# Patient Record
Sex: Female | Born: 1980
Health system: Southern US, Community
[De-identification: ages and names within clinical notes are randomized; demographics above are authoritative.]

## PROBLEM LIST (undated history)

## (undated) ENCOUNTER — Inpatient Hospital Stay (HOSPITAL_COMMUNITY): Payer: Self-pay

## (undated) ENCOUNTER — Ambulatory Visit: Disposition: A | Discharge status: Home / Self Care | Payer: Medicaid Other

## (undated) ENCOUNTER — Emergency Department (HOSPITAL_COMMUNITY): Disposition: A | Discharge status: Home / Self Care | Payer: 59

## (undated) DIAGNOSIS — F909 Attention-deficit hyperactivity disorder, unspecified type: Secondary | ICD-10-CM

## (undated) DIAGNOSIS — R519 Headache, unspecified: Secondary | ICD-10-CM

## (undated) DIAGNOSIS — F99 Mental disorder, not otherwise specified: Secondary | ICD-10-CM

## (undated) DIAGNOSIS — Z309 Encounter for contraceptive management, unspecified: Secondary | ICD-10-CM

## (undated) DIAGNOSIS — R87629 Unspecified abnormal cytological findings in specimens from vagina: Secondary | ICD-10-CM

## (undated) DIAGNOSIS — M549 Dorsalgia, unspecified: Secondary | ICD-10-CM

## (undated) DIAGNOSIS — K831 Obstruction of bile duct: Secondary | ICD-10-CM

## (undated) DIAGNOSIS — N390 Urinary tract infection, site not specified: Secondary | ICD-10-CM

## (undated) DIAGNOSIS — L719 Rosacea, unspecified: Secondary | ICD-10-CM

## (undated) DIAGNOSIS — Z789 Other specified health status: Secondary | ICD-10-CM

## (undated) HISTORY — PX: WISDOM TOOTH EXTRACTION: SHX21

## (undated) HISTORY — DX: Encounter for contraceptive management, unspecified: Z30.9

## (undated) HISTORY — DX: Unspecified abnormal cytological findings in specimens from vagina: R87.629

## (undated) HISTORY — DX: Mental disorder, not otherwise specified: F99

## (undated) HISTORY — DX: Dorsalgia, unspecified: M54.9

## (undated) HISTORY — DX: Rosacea, unspecified: L71.9

## (undated) HISTORY — DX: Urinary tract infection, site not specified: N39.0

---

## 2009-11-30 ENCOUNTER — Ambulatory Visit (HOSPITAL_COMMUNITY): Admission: RE | Admit: 2009-11-30 | Discharge: 2009-11-30 | Payer: Self-pay | Admitting: Family Medicine

## 2009-12-06 ENCOUNTER — Encounter: Admission: RE | Admit: 2009-12-06 | Discharge: 2009-12-06 | Payer: Self-pay | Admitting: Otolaryngology

## 2010-04-10 ENCOUNTER — Other Ambulatory Visit: Admission: RE | Admit: 2010-04-10 | Discharge: 2010-04-10 | Payer: Self-pay | Admitting: Obstetrics & Gynecology

## 2010-12-23 ENCOUNTER — Encounter: Payer: Self-pay | Admitting: Otolaryngology

## 2011-05-08 ENCOUNTER — Other Ambulatory Visit (HOSPITAL_COMMUNITY)
Admission: RE | Admit: 2011-05-08 | Discharge: 2011-05-08 | Disposition: A | Discharge status: Home / Self Care | Payer: Managed Care, Other (non HMO) | Source: Ambulatory Visit | Attending: Obstetrics and Gynecology | Admitting: Obstetrics and Gynecology

## 2011-05-08 DIAGNOSIS — Z113 Encounter for screening for infections with a predominantly sexual mode of transmission: Secondary | ICD-10-CM | POA: Insufficient documentation

## 2011-05-08 DIAGNOSIS — Z01419 Encounter for gynecological examination (general) (routine) without abnormal findings: Secondary | ICD-10-CM | POA: Insufficient documentation

## 2011-09-21 ENCOUNTER — Inpatient Hospital Stay (HOSPITAL_COMMUNITY)
Admission: AD | Admit: 2011-09-21 | Discharge: 2011-09-21 | Disposition: A | Discharge status: Home / Self Care | Payer: Managed Care, Other (non HMO) | Source: Ambulatory Visit | Attending: Obstetrics & Gynecology | Admitting: Obstetrics & Gynecology

## 2011-09-21 ENCOUNTER — Encounter (HOSPITAL_COMMUNITY): Payer: Self-pay

## 2011-09-21 DIAGNOSIS — O99891 Other specified diseases and conditions complicating pregnancy: Secondary | ICD-10-CM | POA: Insufficient documentation

## 2011-09-21 DIAGNOSIS — R42 Dizziness and giddiness: Secondary | ICD-10-CM | POA: Insufficient documentation

## 2011-09-21 HISTORY — DX: Other specified health status: Z78.9

## 2011-09-21 LAB — URINALYSIS, ROUTINE W REFLEX MICROSCOPIC
Bilirubin Urine: NEGATIVE
Ketones, ur: NEGATIVE mg/dL
Leukocytes, UA: NEGATIVE
Nitrite: NEGATIVE
Protein, ur: NEGATIVE mg/dL
Specific Gravity, Urine: <1.005 — ABNORMAL LOW (ref 1.005–1.030)
pH: 6 (ref 5.0–8.0)

## 2011-09-21 NOTE — Progress Notes (Signed)
In childbirth class having headache, some shortness of breath, light headed

## 2011-09-21 NOTE — ED Provider Notes (Signed)
Attestation of Attending Supervision of Advanced Practitioner: Evaluation and management procedures were performed by the PA/NP/CNM/OB Fellow under my supervision/collaboration. Chart reviewed and agree with management and plan.  Yazlyn Wentzel A 09/21/2011 4:55 PM   

## 2011-09-21 NOTE — ED Provider Notes (Signed)
Chief Complaint:  Shortness of Breath and Dizziness   Donna Kim is  30 y.o. G1P0 at [redacted]w[redacted]d presents complaining of Shortness of Breath and Dizziness .  She states none contractions associated with none vaginal bleeding, intact membranes, along with active fetal movement. She was in Childbirth Class here at [facility], doing breathing exercises, and began feeling dizzy and SOB.  Feels better now after resting  Obstetrical/Gynecological History: Menstrual History: OB History    Grav Para Term Preterm Abortions TAB SAB Ect Mult Living   1                No LMP recorded. Patient is pregnant.     Past Medical History: Past Medical History  Diagnosis Date  . No pertinent past medical history     Past Surgical History: Past Surgical History  Procedure Date  . Wisdom tooth extraction     Family History: No family history on file.  Social History: History  Substance Use Topics  . Smoking status: Former Games developer  . Smokeless tobacco: Not on file  . Alcohol Use: No    Allergies: No Known Allergies  Meds:  Prescriptions prior to admission  Medication Sig Dispense Refill  . acetaminophen (TYLENOL) 325 MG tablet Take 650 mg by mouth every 6 (six) hours as needed. Patient took this medication for pain.       . prenatal vitamin w/FE, FA (PRENATAL 1 + 1) 27-1 MG TABS Take 1 tablet by mouth daily.          Review of Systems - Please refer to the aforementioned patients' reports.     Physical Exam  Blood pressure 110/73, pulse 64, temperature 97.7 F (36.5 C), temperature source Oral, resp. rate 16, height 5\' 4"  (1.626 m), weight 80.196 kg (176 lb 12.8 oz), SpO2 98.00%. GENERAL: Well-developed, well-nourished female in no acute distress.  LUNGS: Clear to auscultation bilaterally.  HEART: Regular rate and rhythm. ABDOMEN: Soft, nontender, nondistended, gravid.  EXTREMITIES: Nontender, no edema, 2+ distal pulses. FHT:  Baseline rate 140 bpm   Variability moderate  Accelerations  present   Decelerations none Contractions: Every 0 mins   Labs: Recent Results (from the past 24 hour(s))  URINALYSIS, ROUTINE W REFLEX MICROSCOPIC   Collection Time   09/21/11  2:10 PM      Component Value Range   Color, Urine YELLOW  YELLOW    Appearance CLEAR  CLEAR    Specific Gravity, Urine <1.005 (*) 1.005 - 1.030    pH 6.0  5.0 - 8.0    Glucose, UA NEGATIVE  NEGATIVE (mg/dL)   Hgb urine dipstick NEGATIVE  NEGATIVE    Bilirubin Urine NEGATIVE  NEGATIVE    Ketones, ur NEGATIVE  NEGATIVE (mg/dL)   Protein, ur NEGATIVE  NEGATIVE (mg/dL)   Urobilinogen, UA 0.2  0.0 - 1.0 (mg/dL)   Nitrite NEGATIVE  NEGATIVE    Leukocytes, UA NEGATIVE  NEGATIVE    Imaging Studies:  No results found.  Assessment: Donna Kim is  30 y.o. G1P0 at [redacted]w[redacted]d presents with Dizziness probably asso with hyperventilation.  Plan: D/C home.  Pt plans to go back to class  CRESENZO-DISHMAN,Darroll Bredeson 10/20/20123:28 PM

## 2011-09-21 NOTE — Progress Notes (Signed)
Was in childbirth classes today, was doing breathing exercises, felt SOB.  Earlier, felt "like my heart had moved to left side and palpitations."

## 2011-12-03 NOTE — L&D Delivery Note (Cosign Needed)
Delivery Note At 10:34 AM a viable female was delivered via Vaginal, Spontaneous Delivery (Presentation: ; Occiput Anterior).  APGAR: 9, 9; weight 7 lb 2.6 oz (3249 g).   Placenta status: Intact, Spontaneous.  Cord: 3 vessels with the following complications: None.  Cord pH: not indicated.  Anesthesia: Epidural  Episiotomy: None Lacerations: 2nd degree;Perineal Suture Repair: 2.0 Vicryl Est. Blood Loss (mL): 350 ml   Mom to postpartum.  Baby to nursery-stable. Infant skin-skin with mother, bonding well, family members at bedside.    LEFTWICH-KIRBY, Hatley Henegar 01/02/2012, 11:45 AM

## 2012-01-02 ENCOUNTER — Inpatient Hospital Stay (HOSPITAL_COMMUNITY)
Admission: AD | Admit: 2012-01-02 | Discharge: 2012-01-04 | DRG: 775 | Disposition: A | Discharge status: Home / Self Care | Payer: Medicaid Other | Source: Ambulatory Visit | Attending: Obstetrics and Gynecology | Admitting: Obstetrics and Gynecology

## 2012-01-02 ENCOUNTER — Inpatient Hospital Stay (HOSPITAL_COMMUNITY): Payer: Medicaid Other | Admitting: Anesthesiology

## 2012-01-02 ENCOUNTER — Encounter (HOSPITAL_COMMUNITY): Payer: Self-pay | Admitting: *Deleted

## 2012-01-02 ENCOUNTER — Encounter (HOSPITAL_COMMUNITY): Payer: Self-pay | Admitting: Anesthesiology

## 2012-01-02 DIAGNOSIS — Z22330 Carrier of Group B streptococcus: Secondary | ICD-10-CM

## 2012-01-02 DIAGNOSIS — O9989 Other specified diseases and conditions complicating pregnancy, childbirth and the puerperium: Secondary | ICD-10-CM

## 2012-01-02 DIAGNOSIS — O99892 Other specified diseases and conditions complicating childbirth: Secondary | ICD-10-CM

## 2012-01-02 LAB — CBC
HCT: 36.7 % (ref 36.0–46.0)
Hemoglobin: 12.8 g/dL (ref 12.0–15.0)
WBC: 16.6 10*3/uL — ABNORMAL HIGH (ref 4.0–10.5)

## 2012-01-02 LAB — ABO/RH

## 2012-01-02 LAB — GC/CHLAMYDIA PROBE AMP, GENITAL
Chlamydia: NEGATIVE
Gonorrhea: NEGATIVE

## 2012-01-02 LAB — STREP B DNA PROBE: GBS: POSITIVE

## 2012-01-02 LAB — RPR: RPR Ser Ql: NONREACTIVE

## 2012-01-02 MED ORDER — PENICILLIN G POTASSIUM 5000000 UNITS IJ SOLR
2.5000 10*6.[IU] | INTRAVENOUS | Status: DC
  Administered 2012-01-02: 2.5 10*6.[IU] via INTRAVENOUS
  Filled 2012-01-02 (×6): qty 2.5

## 2012-01-02 MED ORDER — STERILE WATER FOR INJECTION IJ SOLN
2.0000 mL | Freq: Once | INTRAMUSCULAR | Status: AC
  Administered 2012-01-02: 2 mL via INTRAMUSCULAR
  Filled 2012-01-02: qty 5

## 2012-01-02 MED ORDER — DIPHENHYDRAMINE HCL 25 MG PO CAPS: 25.0000 mg | ORAL_CAPSULE | Freq: Four times a day (QID) | ORAL | Status: DC | PRN

## 2012-01-02 MED ORDER — PRENATAL MULTIVITAMIN CH
1.0000 | ORAL_TABLET | Freq: Every day | ORAL | Status: DC
  Administered 2012-01-03 – 2012-01-04 (×2): 1 via ORAL
  Filled 2012-01-02 (×2): qty 1

## 2012-01-02 MED ORDER — OXYCODONE-ACETAMINOPHEN 5-325 MG PO TABS: 1.0000 | ORAL_TABLET | ORAL | Status: DC | PRN

## 2012-01-02 MED ORDER — CITRIC ACID-SODIUM CITRATE 334-500 MG/5ML PO SOLN: 30.0000 mL | ORAL | Status: DC | PRN

## 2012-01-02 MED ORDER — ONDANSETRON HCL 4 MG PO TABS: 4.0000 mg | ORAL_TABLET | ORAL | Status: DC | PRN

## 2012-01-02 MED ORDER — DIBUCAINE 1 % RE OINT: 1.0000 "application " | TOPICAL_OINTMENT | RECTAL | Status: DC | PRN

## 2012-01-02 MED ORDER — FENTANYL CITRATE 0.05 MG/ML IJ SOLN
50.0000 ug | INTRAMUSCULAR | Status: DC | PRN
  Administered 2012-01-02 (×2): 50 ug via INTRAVENOUS
  Filled 2012-01-02 (×2): qty 2

## 2012-01-02 MED ORDER — PHENYLEPHRINE 40 MCG/ML (10ML) SYRINGE FOR IV PUSH (FOR BLOOD PRESSURE SUPPORT)
80.0000 ug | PREFILLED_SYRINGE | INTRAVENOUS | Status: DC | PRN
  Filled 2012-01-02: qty 5

## 2012-01-02 MED ORDER — LACTATED RINGERS IV SOLN
INTRAVENOUS | Status: DC
  Administered 2012-01-02 (×2): via INTRAVENOUS
  Administered 2012-01-02: 500 mL/h via INTRAVENOUS

## 2012-01-02 MED ORDER — PHENYLEPHRINE 40 MCG/ML (10ML) SYRINGE FOR IV PUSH (FOR BLOOD PRESSURE SUPPORT): 80.0000 ug | PREFILLED_SYRINGE | INTRAVENOUS | Status: DC | PRN

## 2012-01-02 MED ORDER — OXYTOCIN 20 UNITS IN LACTATED RINGERS INFUSION - SIMPLE
125.0000 mL/h | Freq: Once | INTRAVENOUS | Status: AC
  Administered 2012-01-02: 999 mL/h via INTRAVENOUS

## 2012-01-02 MED ORDER — LANOLIN HYDROUS EX OINT: TOPICAL_OINTMENT | CUTANEOUS | Status: DC | PRN

## 2012-01-02 MED ORDER — FLEET ENEMA 7-19 GM/118ML RE ENEM: 1.0000 | ENEMA | RECTAL | Status: DC | PRN

## 2012-01-02 MED ORDER — EPHEDRINE 5 MG/ML INJ
10.0000 mg | INTRAVENOUS | Status: DC | PRN
  Filled 2012-01-02: qty 4

## 2012-01-02 MED ORDER — WITCH HAZEL-GLYCERIN EX PADS: 1.0000 "application " | MEDICATED_PAD | CUTANEOUS | Status: DC | PRN

## 2012-01-02 MED ORDER — TETANUS-DIPHTH-ACELL PERTUSSIS 5-2.5-18.5 LF-MCG/0.5 IM SUSP
0.5000 mL | Freq: Once | INTRAMUSCULAR | Status: AC
  Administered 2012-01-03: 0.5 mL via INTRAMUSCULAR
  Filled 2012-01-02: qty 0.5

## 2012-01-02 MED ORDER — ONDANSETRON HCL 4 MG/2ML IJ SOLN
4.0000 mg | Freq: Four times a day (QID) | INTRAMUSCULAR | Status: DC | PRN
  Administered 2012-01-02: 4 mg via INTRAVENOUS
  Filled 2012-01-02: qty 2

## 2012-01-02 MED ORDER — LIDOCAINE HCL 1.5 % IJ SOLN
INTRAMUSCULAR | Status: DC | PRN
  Administered 2012-01-02 (×2): 5 mL via EPIDURAL

## 2012-01-02 MED ORDER — ACETAMINOPHEN 325 MG PO TABS: 650.0000 mg | ORAL_TABLET | ORAL | Status: DC | PRN

## 2012-01-02 MED ORDER — BENZOCAINE-MENTHOL 20-0.5 % EX AERO
1.0000 "application " | INHALATION_SPRAY | CUTANEOUS | Status: DC | PRN
  Administered 2012-01-02: 1 via TOPICAL

## 2012-01-02 MED ORDER — LIDOCAINE HCL (PF) 1 % IJ SOLN
30.0000 mL | INTRAMUSCULAR | Status: DC | PRN
  Filled 2012-01-02: qty 30

## 2012-01-02 MED ORDER — FENTANYL 2.5 MCG/ML BUPIVACAINE 1/10 % EPIDURAL INFUSION (WH - ANES)
14.0000 mL/h | INTRAMUSCULAR | Status: DC
  Administered 2012-01-02: 12 mL/h via EPIDURAL
  Filled 2012-01-02: qty 60

## 2012-01-02 MED ORDER — IBUPROFEN 600 MG PO TABS
600.0000 mg | ORAL_TABLET | Freq: Four times a day (QID) | ORAL | Status: DC
  Administered 2012-01-02 – 2012-01-04 (×8): 600 mg via ORAL
  Filled 2012-01-02 (×8): qty 1

## 2012-01-02 MED ORDER — OXYTOCIN BOLUS FROM INFUSION
500.0000 mL | Freq: Once | INTRAVENOUS | Status: DC
  Filled 2012-01-02: qty 1000
  Filled 2012-01-02: qty 500

## 2012-01-02 MED ORDER — IBUPROFEN 600 MG PO TABS: 600.0000 mg | ORAL_TABLET | Freq: Four times a day (QID) | ORAL | Status: DC | PRN

## 2012-01-02 MED ORDER — LACTATED RINGERS IV SOLN: 500.0000 mL | Freq: Once | INTRAVENOUS | Status: DC

## 2012-01-02 MED ORDER — ONDANSETRON HCL 4 MG/2ML IJ SOLN: 4.0000 mg | INTRAMUSCULAR | Status: DC | PRN

## 2012-01-02 MED ORDER — LACTATED RINGERS IV SOLN: 500.0000 mL | INTRAVENOUS | Status: DC | PRN

## 2012-01-02 MED ORDER — EPHEDRINE 5 MG/ML INJ: 10.0000 mg | INTRAVENOUS | Status: DC | PRN

## 2012-01-02 MED ORDER — BENZOCAINE-MENTHOL 20-0.5 % EX AERO
INHALATION_SPRAY | CUTANEOUS | Status: AC
  Administered 2012-01-02: 1 via TOPICAL
  Filled 2012-01-02: qty 56

## 2012-01-02 MED ORDER — ZOLPIDEM TARTRATE 5 MG PO TABS: 5.0000 mg | ORAL_TABLET | Freq: Every evening | ORAL | Status: DC | PRN

## 2012-01-02 MED ORDER — SIMETHICONE 80 MG PO CHEW: 80.0000 mg | CHEWABLE_TABLET | ORAL | Status: DC | PRN

## 2012-01-02 MED ORDER — CALCIUM CARBONATE ANTACID 500 MG PO CHEW
1.0000 | CHEWABLE_TABLET | Freq: Two times a day (BID) | ORAL | Status: DC | PRN
  Administered 2012-01-02 (×2): 200 mg via ORAL
  Filled 2012-01-02: qty 2
  Filled 2012-01-02: qty 1

## 2012-01-02 MED ORDER — NALBUPHINE SYRINGE 5 MG/0.5 ML: 5.0000 mg | INJECTION | INTRAMUSCULAR | Status: DC | PRN

## 2012-01-02 MED ORDER — DEXTROSE 5 % IV SOLN
5.0000 10*6.[IU] | Freq: Once | INTRAVENOUS | Status: AC
  Administered 2012-01-02: 5 10*6.[IU] via INTRAVENOUS
  Filled 2012-01-02: qty 5

## 2012-01-02 MED ORDER — DIPHENHYDRAMINE HCL 50 MG/ML IJ SOLN: 12.5000 mg | INTRAMUSCULAR | Status: DC | PRN

## 2012-01-02 MED ORDER — SENNOSIDES-DOCUSATE SODIUM 8.6-50 MG PO TABS
2.0000 | ORAL_TABLET | Freq: Every day | ORAL | Status: DC
  Administered 2012-01-02 – 2012-01-03 (×2): 2 via ORAL

## 2012-01-02 NOTE — Anesthesia Procedure Notes (Signed)
Epidural Patient location during procedure: OB Start time: 01/02/2012 7:20 AM  Staffing Performed by: anesthesiologist   Preanesthetic Checklist Completed: patient identified, site marked, surgical consent, pre-op evaluation, timeout performed, IV checked, risks and benefits discussed and monitors and equipment checked  Epidural Patient position: sitting Prep: site prepped and draped and DuraPrep Patient monitoring: continuous pulse ox and blood pressure Approach: midline Injection technique: LOR air and LOR saline  Needle:  Needle type: Tuohy  Needle gauge: 17 G Needle length: 9 cm Needle insertion depth: 5 cm cm Catheter type: closed end flexible Catheter size: 19 Gauge Catheter at skin depth: 10 cm Test dose: negative  Assessment Events: blood not aspirated, injection not painful, no injection resistance, negative IV test and no paresthesia  Additional Notes Patient identified.  Risk benefits discussed including failed block, incomplete pain control, headache, nerve damage, paralysis, blood pressure changes, nausea, vomiting, reactions to medication both toxic or allergic, and postpartum back pain.  Patient expressed understanding and wished to proceed.  All questions were answered.  Sterile technique used throughout procedure and epidural site dressed with sterile barrier dressing. No paresthesia or other complications noted.The patient did not experience any signs of intravascular injection such as tinnitus or metallic taste in mouth nor signs of intrathecal spread such as rapid motor block. Please see nursing notes for vital signs.

## 2012-01-02 NOTE — Progress Notes (Signed)
Donna Kim is a 31 y.o. G1P0 at [redacted]w[redacted]d by ultrasound admitted for active labor  Subjective: Patient comfortable. Starting to feel pain with contractions. States that she would like an epidural   Objective: BP 117/75  Pulse 94  Temp(Src) 97.5 F (36.4 C) (Oral)  Resp 20  Ht 5\' 4"  (1.626 m)  Wt 88.905 kg (196 lb)  BMI 33.64 kg/m2      FHT:  FHR: 130 bpm, variability: moderate,  accelerations:  Present,  decelerations:  Absent UC:   regular, every 3-5 minutes SVE:   Dilation: 8 Effacement (%): 90 Station: 0 Exam by:: Garen Lah, MD  Labs: Lab Results  Component Value Date   WBC 16.6* 01/02/2012   HGB 12.8 01/02/2012   HCT 36.7 01/02/2012   MCV 91.3 01/02/2012   PLT 122* 01/02/2012    Assessment / Plan: Spontaneous labor, progressing normally AROM at 0400  Labor: Progressing normally Preeclampsia:  no S/Sx Fetal Wellbeing:  Category I Pain Control:  fentanyl prn, epidural if desired I/D:  PCN Anticipated MOD:  NSVD  [name] 01/02/2012, 4:03 AM

## 2012-01-02 NOTE — Progress Notes (Signed)
Pt presents to mau for labor check.  Denies ROM.  States + fetal movement.

## 2012-01-02 NOTE — Progress Notes (Signed)
Pt may go to room 169. 

## 2012-01-02 NOTE — Anesthesia Preprocedure Evaluation (Signed)
Anesthesia Evaluation  Patient identified by MRN, date of birth, ID band Patient awake    Reviewed: Allergy & Precautions, H&P , Patient's Chart, lab work & pertinent test results  Airway Mallampati: II TM Distance: >3 FB Neck ROM: full    Dental No notable dental hx.    Pulmonary neg pulmonary ROS,  clear to auscultation  Pulmonary exam normal       Cardiovascular neg cardio ROS regular Normal    Neuro/Psych Negative Neurological ROS  Negative Psych ROS   GI/Hepatic negative GI ROS, Neg liver ROS,   Endo/Other  Negative Endocrine ROS  Renal/GU negative Renal ROS     Musculoskeletal   Abdominal   Peds  Hematology negative hematology ROS (+)   Anesthesia Other Findings   Reproductive/Obstetrics (+) Pregnancy                           Anesthesia Physical Anesthesia Plan  ASA: II  Anesthesia Plan: Epidural   Post-op Pain Management:    Induction:   Airway Management Planned:   Additional Equipment:   Intra-op Plan:   Post-operative Plan:   Informed Consent: I have reviewed the patients History and Physical, chart, labs and discussed the procedure including the risks, benefits and alternatives for the proposed anesthesia with the patient or authorized representative who has indicated his/her understanding and acceptance.     Plan Discussed with:   Anesthesia Plan Comments:         Anesthesia Quick Evaluation  

## 2012-01-02 NOTE — H&P (Signed)
Donna Kim is a 31 y.o. female presenting for contractions. Maternal Medical History:  Reason for admission: Reason for admission: contractions.  69yr G1P0 at 46.6wks presents for contractions  Pt states worsening contractions for approximately 9hrs, have been steadily increasing. No leakage of fluid, no discharge, reports good fetal movement. Patient had membranes stripped earlier yesterday,was 3.5cm at that time.   Mother received prenatal care at [facility]. Pregnancy has been uncomplicated. Maternal labs significant for GBS (+), 2hr GTT [phone], anatomy US normal, genetic testing declined. Mother elects to breast feed. She is unsure about an epidural at this time.   Contractions: Onset was 6-12 hours ago.   Frequency: regular.   Perceived severity is moderate.    Fetal activity: Perceived fetal activity is normal.   Last perceived fetal movement was within the past hour.      OB History    Grav Para Term Preterm Abortions TAB SAB Ect Mult Living   1              Past Medical History  Diagnosis Date  . No pertinent past medical history    Past Surgical History  Procedure Date  . Wisdom tooth extraction    Family History: family history is not on file. Social History:  reports that she has quit smoking. She does not have any smokeless tobacco history on file. She reports that she does not drink alcohol or use illicit drugs.  Review of Systems  Constitutional: Negative for fever and chills.  Eyes: Negative for blurred vision and double vision.  Cardiovascular: Negative for chest pain.  Gastrointestinal: Negative for abdominal pain.  Neurological: Negative for headaches.  All other systems reviewed and are negative.    Dilation: 4.5 Effacement (%): 80 Station: -2 Exam by:: Humphrey Rolls, RN Blood pressure 125/78, pulse 94, resp. rate 18, height 5\' 4"  (1.626 m), weight 88.905 kg (196 lb). Maternal Exam:  Uterine Assessment: Contraction strength is moderate.   Contraction frequency is regular.   Abdomen: Fetal presentation: vertex     Fetal Exam Fetal Monitor Review: Baseline rate: 140.  Variability: moderate (6-25 bpm).   Pattern: no accelerations and no decelerations.    Fetal State Assessment: Category I - tracings are normal.     Physical Exam  Nursing note and vitals reviewed. Constitutional: She is oriented to person, place, and time. She appears well-developed and well-nourished. No distress.  HENT:  Head: Normocephalic and atraumatic.  Eyes: EOM are normal. Pupils are equal, round, and reactive to light.  Cardiovascular: Normal rate, regular rhythm, normal heart sounds and intact distal pulses.  Exam reveals no gallop and no friction rub.   No murmur heard. Respiratory: Effort normal and breath sounds normal. No respiratory distress. She has no wheezes. She has no rales. She exhibits no tenderness.  GI: Soft. There is no tenderness.       Gravid   Musculoskeletal: Normal range of motion. She exhibits no edema and no tenderness.  Neurological: She is alert and oriented to person, place, and time. No cranial nerve deficit.  Skin: Skin is warm and dry. No rash noted. She is not diaphoretic. No erythema. No pallor.  Psychiatric: She has a normal mood and affect. Her behavior is normal. Judgment and thought content normal.    Prenatal labs: ABO, Rh:  O+ Antibody:   neg Rubella:  imm RPR:   neg HBsAg:   neg HIV:   neg GBS:   pos  Assessment/Plan:  Intrauterine pregnancy  -  40.6 wks  - category I tracing  - GBS (+), Tx with PCN  - epidural prn  - expectant management   - anticipate SVD  Donna Kim 01/02/2012, 2:34 AM  Notes Reviewed. Latent phase labor in this primiparous female patient at 40 weeks 6 days , after prenatal course at [facility] , with + GBS cultures. Admitted for labor mgmt.  Gbs prophylaxis to be done

## 2012-01-02 NOTE — Progress Notes (Signed)
Donna Kim is a 31 y.o. G1P0 at [redacted]w[redacted]d  Subjective: Comfortable with epidural. Complaints of back pain  Objective: BP 126/70  Pulse 81  Temp(Src) 99 F (37.2 C) (Axillary)  Resp 16  Ht 5\' 4"  (1.626 m)  Wt 88.905 kg (196 lb)  BMI 33.64 kg/m2  SpO2 100%      FHT:  FHR: 146 bpm, variability: moderate,  accelerations:  Present,  decelerations:  Absent UC:   regular, every 1-2 minutes SVE:   Dilation: 10 Effacement (%): 100 Station: +2;+3 Exam by:: E. Cone RNC  Labs: Lab Results  Component Value Date   WBC 16.6* 01/02/2012   HGB 12.8 01/02/2012   HCT 36.7 01/02/2012   MCV 91.3 01/02/2012   PLT 122* 01/02/2012    Assessment / Plan: Spontaneous labor, progressing normally  Labor: Progressing normally Fetal Wellbeing:  Category I Pain Control:  Epidural I/D:  n/a Anticipated MOD:  NSVD   D. Piloto [employer]. MD PGY-1 01/02/2012, 9:20 AM

## 2012-01-03 NOTE — Progress Notes (Signed)
Post Partum Day 1 Subjective: no complaints, up ad lib, voiding, tolerating PO and + flatus breastfeeding, some trouble latching. Would like another lactation consult.  Objective: Blood pressure 99/64, pulse 98, temperature 98 F (36.7 C), temperature source Oral, resp. rate 18, height 5\' 4"  (1.626 m), weight 88.905 kg (196 lb), SpO2 99.00%, unknown if currently breastfeeding.  Physical Exam:  General: alert, cooperative and no distress Lochia: appropriate Uterine Fundus: firm Incision: NA DVT Evaluation: No evidence of DVT seen on physical exam. Negative Homan's sign.   Basename 01/02/12 0245  HGB 12.8  HCT 36.7    Assessment/Plan: Plan for discharge tomorrow, Breastfeeding and Contraception - Handouts on LARC per UPTODATE   LOS: 1 day   Arthor Captain 01/03/2012, 8:00 AM

## 2012-01-03 NOTE — Anesthesia Postprocedure Evaluation (Signed)
  Anesthesia Post-op Note  Patient: Donna Kim  Procedure(s) Performed: * No procedures listed *  Patient Location: Mother/Baby  Anesthesia Type: MAC  Level of Consciousness: awake  Airway and Oxygen Therapy: Patient Spontanous Breathing  Post-op Pain: none  Post-op Assessment: Patient's Cardiovascular Status Stable, Respiratory Function Stable and Pain level controlled  Post-op Vital Signs: Reviewed and stable  Complications: No apparent anesthesia complications

## 2012-01-03 NOTE — Progress Notes (Signed)
UR Chart review completed.  

## 2012-01-04 MED ORDER — IBUPROFEN 600 MG PO TABS: 600.0000 mg | ORAL_TABLET | Freq: Four times a day (QID) | ORAL | Status: AC

## 2012-01-04 MED ORDER — LANOLIN HYDROUS EX OINT: 1.0000 "application " | TOPICAL_OINTMENT | CUTANEOUS | Status: DC | PRN

## 2012-01-04 NOTE — Discharge Summary (Signed)
Obstetric Discharge Summary Reason for Admission: onset of labor Prenatal Procedures: ultrasound Intrapartum Procedures: spontaneous vaginal delivery Postpartum Procedures: none Complications-Operative and Postpartum: 2 degree perineal laceration Hemoglobin  Date Value Range Status  01/02/2012 12.8  12.0-15.0 (g/dL) Final     HCT  Date Value Range Status  01/02/2012 36.7  36.0-46.0 (%) Final    Discharge Diagnoses: Term Pregnancy-delivered  Discharge Information: Date: 01/04/2012 Activity: pelvic rest Diet: routine Medications: PNV and Ibuprofen Condition: stable Instructions: refer to practice specific booklet Discharge to: home Follow-up Information    Follow up with Tilda Burrow, MD. Schedule an appointment as soon as possible for a visit in 6 weeks.   Contact information:   [facility] [address] [place] [address] [phone]          Newborn Data: Live born female  Birth Weight: 7 lb 2.6 oz (3249 g) APGAR: 9, 9  Home with mother.  Daniela Siebers V 01/04/2012, 7:54 AM

## 2012-01-05 ENCOUNTER — Inpatient Hospital Stay (HOSPITAL_COMMUNITY): Admission: RE | Admit: 2012-01-05 | Payer: Managed Care, Other (non HMO) | Source: Ambulatory Visit

## 2012-07-02 ENCOUNTER — Other Ambulatory Visit (HOSPITAL_COMMUNITY)
Admission: RE | Admit: 2012-07-02 | Discharge: 2012-07-02 | Disposition: A | Discharge status: Home / Self Care | Payer: Medicaid Other | Source: Ambulatory Visit | Attending: Obstetrics & Gynecology | Admitting: Obstetrics & Gynecology

## 2012-07-02 DIAGNOSIS — Z113 Encounter for screening for infections with a predominantly sexual mode of transmission: Secondary | ICD-10-CM | POA: Insufficient documentation

## 2012-07-02 DIAGNOSIS — Z01419 Encounter for gynecological examination (general) (routine) without abnormal findings: Secondary | ICD-10-CM | POA: Insufficient documentation

## 2013-03-06 ENCOUNTER — Encounter (HOSPITAL_COMMUNITY): Payer: Self-pay | Admitting: *Deleted

## 2013-03-06 ENCOUNTER — Emergency Department (HOSPITAL_COMMUNITY)
Admission: EM | Admit: 2013-03-06 | Discharge: 2013-03-07 | Disposition: A | Discharge status: Home / Self Care | Payer: BC Managed Care – PPO | Attending: Emergency Medicine | Admitting: Emergency Medicine

## 2013-03-06 DIAGNOSIS — Z87891 Personal history of nicotine dependence: Secondary | ICD-10-CM | POA: Insufficient documentation

## 2013-03-06 DIAGNOSIS — K5289 Other specified noninfective gastroenteritis and colitis: Secondary | ICD-10-CM | POA: Insufficient documentation

## 2013-03-06 DIAGNOSIS — K529 Noninfective gastroenteritis and colitis, unspecified: Secondary | ICD-10-CM

## 2013-03-06 DIAGNOSIS — R1013 Epigastric pain: Secondary | ICD-10-CM | POA: Insufficient documentation

## 2013-03-06 DIAGNOSIS — R197 Diarrhea, unspecified: Secondary | ICD-10-CM | POA: Insufficient documentation

## 2013-03-06 LAB — CBC WITH DIFFERENTIAL/PLATELET
Eosinophils Absolute: 0 10*3/uL (ref 0.0–0.7)
Eosinophils Relative: 0 % (ref 0–5)
Hemoglobin: 14.6 g/dL (ref 12.0–15.0)
Lymphs Abs: 0.2 10*3/uL — ABNORMAL LOW (ref 0.7–4.0)
MCH: 30.6 pg (ref 26.0–34.0)
MCV: 88.5 fL (ref 78.0–100.0)
Monocytes Relative: 2 % — ABNORMAL LOW (ref 3–12)
Platelets: 162 10*3/uL (ref 150–400)
RBC: 4.77 MIL/uL (ref 3.87–5.11)

## 2013-03-06 LAB — BASIC METABOLIC PANEL
BUN: 14 mg/dL (ref 6–23)
Calcium: 9.5 mg/dL (ref 8.4–10.5)
GFR calc non Af Amer: >90 mL/min (ref >90)
Glucose, Bld: 123 mg/dL — ABNORMAL HIGH (ref 70–99)

## 2013-03-06 MED ORDER — SODIUM CHLORIDE 0.9 % IV BOLUS (SEPSIS)
1000.0000 mL | Freq: Once | INTRAVENOUS | Status: AC
  Administered 2013-03-06: 1000 mL via INTRAVENOUS

## 2013-03-06 MED ORDER — ONDANSETRON HCL 4 MG/2ML IJ SOLN
4.0000 mg | Freq: Once | INTRAMUSCULAR | Status: AC
  Administered 2013-03-06: 4 mg via INTRAVENOUS
  Filled 2013-03-06: qty 2

## 2013-03-06 MED ORDER — ONDANSETRON 4 MG PO TBDP: ORAL_TABLET | ORAL | Status: DC

## 2013-03-06 NOTE — ED Notes (Signed)
MD at bedside. 

## 2013-03-06 NOTE — ED Notes (Signed)
Pt states nausea is better at this time. Pt now taking sips of sprite.

## 2013-03-06 NOTE — ED Notes (Addendum)
Pt c/o n/v/d since 1pm today. Unable to keep anything down. Pt also c/o abdominal pain.

## 2013-03-06 NOTE — ED Provider Notes (Signed)
History  This chart was scribed for Donna Lennert, MD by Bennett Scrape, ED Scribe. This patient was seen in room APA19/APA19 and the patient's care was started at 9:27 PM.  CSN: [id number]  Arrival date & time 03/06/13  2058   First MD Initiated Contact with Patient 03/06/13 2127      Chief Complaint  Patient presents with  . Nausea  . Emesis  . Diarrhea  . Abdominal Pain     Patient is a 32 y.o. female presenting with vomiting. The history is provided by the patient. No language interpreter was used.  Emesis Severity:  Moderate Duration:  8 hours Number of daily episodes:  >5 Quality:  Stomach contents Progression:  Worsening Chronicity:  New Recent urination:  Normal Relieved by:  Nothing Worsened by:  Nothing tried Ineffective treatments:  None tried Associated symptoms: abdominal pain and diarrhea   Associated symptoms: no headaches   Risk factors: sick contacts     Donna Kim is a 32 y.o. female who presents to the Emergency Department complaining of 8 hours of more than 5 episodes of emesis with associated 5 episodes of diarrhea described as watery and mild epigastric abdominal pain. She reports that the emesis began first followed by the diarrhea; however, 2 hours ago the episodes began occuring simultaneously. She states that she has not been able to tolerate any fluids since the onset. She confirms sick contacts at her church and at home. She denies hematemesis, hematochezia, fever and chills as associated symptoms. LNMP was 02/21/13. She does not have a h/o chronic medical conditions. She is a former smoker but denies alcohol use.   Past Medical History  Diagnosis Date  . No pertinent past medical history     Past Surgical History  Procedure Laterality Date  . Wisdom tooth extraction      History reviewed. No pertinent family history.  History  Substance Use Topics  . Smoking status: Former Games developer  . Smokeless tobacco: Not on file  . Alcohol  Use: No    OB History   Grav Para Term Preterm Abortions TAB SAB Ect Mult Living   1 1 1       1       Review of Systems  Constitutional: Negative for fatigue.  HENT: Negative for congestion, sinus pressure and ear discharge.   Eyes: Negative for discharge.  Respiratory: Negative for cough.   Cardiovascular: Negative for chest pain.  Gastrointestinal: Positive for vomiting, abdominal pain and diarrhea.  Genitourinary: Negative for frequency and hematuria.  Musculoskeletal: Negative for back pain.  Skin: Negative for rash.  Neurological: Negative for seizures and headaches.  Psychiatric/Behavioral: Negative for hallucinations.    Allergies  Review of patient's allergies indicates no known allergies.  Home Medications   Current Outpatient Rx  Name  Route  Sig  Dispense  Refill  . acetaminophen (TYLENOL) 325 MG tablet   Oral   Take 650 mg by mouth every 6 (six) hours as needed. Patient took this medication for pain.            Triage Vitals: BP 110/75  Pulse 106  Temp(Src) 99 F (37.2 C) (Oral)  Resp 20  Ht 5' 4.5" (1.638 m)  Wt 140 lb (63.504 kg)  BMI 23.67 kg/m2  SpO2 100%  LMP 02/21/2013  Physical Exam  Nursing note and vitals reviewed. Constitutional: She is oriented to person, place, and time. She appears well-developed.  HENT:  Head: Normocephalic and atraumatic.  Dry MM  Eyes: Conjunctivae and EOM are normal. No scleral icterus.  Neck: Neck supple. No thyromegaly present.  Cardiovascular: Normal rate and regular rhythm.  Exam reveals no gallop and no friction rub.   No murmur heard. Pulmonary/Chest: Effort normal and breath sounds normal. No stridor. She has no wheezes. She has no rales. She exhibits no tenderness.  Abdominal: Soft. She exhibits no distension. There is tenderness (mild epigastric tenderness). There is no rebound.  Musculoskeletal: Normal range of motion. She exhibits no edema.  Lymphadenopathy:    She has no cervical adenopathy.   Neurological: She is alert and oriented to person, place, and time. Coordination normal.  Skin: Skin is warm and dry. No rash noted. No erythema.  Psychiatric: She has a normal mood and affect. Her behavior is normal.    ED Course  Procedures (including critical care time)  DIAGNOSTIC STUDIES: Oxygen Saturation is 100% on room air, normal by my interpretation.    COORDINATION OF CARE: 9:37 PM-Discussed treatment plan which includes IV fluids, CBC panel, and BMP with pt at bedside and pt agreed to plan.   9:45 PM- Ordered 1,000 mL of bolus and 4 mg Zofran injection  Labs Reviewed  CBC WITH DIFFERENTIAL - Abnormal; Notable for the following:    WBC 13.9 (*)    Neutrophils Relative 97 (*)    Neutro Abs 13.5 (*)    Lymphocytes Relative 2 (*)    Lymphs Abs 0.2 (*)    Monocytes Relative 2 (*)    All other components within normal limits  BASIC METABOLIC PANEL - Abnormal; Notable for the following:    Glucose, Bld 123 (*)    All other components within normal limits   No results found.   No diagnosis found.    MDM  Pt improved with tx   The chart was scribed for me under my direct supervision.  I personally performed the history, physical, and medical decision making and all procedures in the evaluation of this patient.Donna Lennert, MD 03/06/13 [phone]

## 2013-03-07 NOTE — ED Notes (Signed)
Pt alert & oriented x4, stable gait. Patient given discharge instructions, paperwork & prescription(s). Patient  instructed to stop at the registration desk to finish any additional paperwork. Patient verbalized understanding. Pt left department w/ no further questions. 

## 2013-03-17 ENCOUNTER — Ambulatory Visit (INDEPENDENT_AMBULATORY_CARE_PROVIDER_SITE_OTHER): Payer: BC Managed Care – PPO | Admitting: Nurse Practitioner

## 2013-03-17 ENCOUNTER — Encounter: Payer: Self-pay | Admitting: Nurse Practitioner

## 2013-03-17 VITALS — BP 118/72 | Temp 98.2°F | Wt 141.2 lb

## 2013-03-17 DIAGNOSIS — J069 Acute upper respiratory infection, unspecified: Secondary | ICD-10-CM

## 2013-03-17 MED ORDER — AZITHROMYCIN 250 MG PO TABS: ORAL_TABLET | ORAL | Status: DC

## 2013-03-17 NOTE — Patient Instructions (Signed)
Decongestant; Nasacort AQ; antihistamine

## 2013-03-17 NOTE — Progress Notes (Signed)
Subjective:  Presents with complaints of sinus symptoms over the past 5 days. Cough. Sore throat. Facial area headache more so on the right side today. Began having some color to her sputum today. No fever. Also noticed sore throat about an hour after eating dairy, this is occurred on 2 different occasions. No vomiting diarrhea or abdominal pain. No wheezing.  Objective:   BP 118/72  Temp(Src) 98.2 F (36.8 C) (Oral)  Wt 141 lb 3.2 oz (64.048 kg)  BMI 23.87 kg/m2  LMP 02/21/2013 NAD. Alert, oriented. TMs significant clear effusion, no erythema. Pharynx minimally injected. Neck supple with mild soft adenopathy. Lungs clear. Heart regular rate rhythm.

## 2013-03-17 NOTE — Assessment & Plan Note (Signed)
Z-Pak as directed. OTC meds as directed. Warning signs reviewed. Call back if symptoms worsen or persist. Patient to contact us if she need referral to allergy specialist.

## 2013-10-26 ENCOUNTER — Telehealth: Payer: Self-pay

## 2013-10-26 NOTE — Telephone Encounter (Signed)
Spoke with pt. 3 days late starting period. Home pregnancy test was negative. Discomfort in ovary area x 6 days. Call transferred to front desk to schedule an appt for next week. If pt starts period, and discomfort gets better, will cancel appt. JSY

## 2013-11-01 ENCOUNTER — Encounter: Payer: Self-pay | Admitting: Adult Health

## 2013-11-01 ENCOUNTER — Ambulatory Visit (INDEPENDENT_AMBULATORY_CARE_PROVIDER_SITE_OTHER): Payer: BC Managed Care – PPO | Admitting: Adult Health

## 2013-11-01 ENCOUNTER — Encounter (INDEPENDENT_AMBULATORY_CARE_PROVIDER_SITE_OTHER): Payer: Self-pay

## 2013-11-01 VITALS — BP 100/68 | Ht 64.5 in | Wt 162.0 lb

## 2013-11-01 DIAGNOSIS — Z3201 Encounter for pregnancy test, result positive: Secondary | ICD-10-CM

## 2013-11-01 DIAGNOSIS — Z3200 Encounter for pregnancy test, result unknown: Secondary | ICD-10-CM

## 2013-11-03 ENCOUNTER — Other Ambulatory Visit: Payer: Self-pay | Admitting: Obstetrics & Gynecology

## 2013-11-03 ENCOUNTER — Ambulatory Visit: Payer: BC Managed Care – PPO | Admitting: Adult Health

## 2013-11-03 DIAGNOSIS — O3680X0 Pregnancy with inconclusive fetal viability, not applicable or unspecified: Secondary | ICD-10-CM

## 2013-11-08 ENCOUNTER — Ambulatory Visit (INDEPENDENT_AMBULATORY_CARE_PROVIDER_SITE_OTHER): Payer: BC Managed Care – PPO

## 2013-11-08 DIAGNOSIS — O3680X0 Pregnancy with inconclusive fetal viability, not applicable or unspecified: Secondary | ICD-10-CM

## 2013-11-08 NOTE — Progress Notes (Signed)
U/S(6+1wks)-single IUP with +FCA noted, FHR-119 bpm, CRL c/w LMP dates, cx long and closed, bilateral adnexa WNL

## 2013-11-12 ENCOUNTER — Encounter: Payer: Self-pay | Admitting: Adult Health

## 2013-11-12 ENCOUNTER — Ambulatory Visit (INDEPENDENT_AMBULATORY_CARE_PROVIDER_SITE_OTHER): Payer: BC Managed Care – PPO | Admitting: Adult Health

## 2013-11-12 ENCOUNTER — Telehealth: Payer: Self-pay | Admitting: Obstetrics & Gynecology

## 2013-11-12 VITALS — BP 124/70 | Wt 164.0 lb

## 2013-11-12 DIAGNOSIS — Z331 Pregnant state, incidental: Secondary | ICD-10-CM

## 2013-11-12 DIAGNOSIS — Z1389 Encounter for screening for other disorder: Secondary | ICD-10-CM

## 2013-11-12 DIAGNOSIS — M549 Dorsalgia, unspecified: Secondary | ICD-10-CM | POA: Insufficient documentation

## 2013-11-12 DIAGNOSIS — O9989 Other specified diseases and conditions complicating pregnancy, childbirth and the puerperium: Secondary | ICD-10-CM

## 2013-11-12 HISTORY — DX: Dorsalgia, unspecified: M54.9

## 2013-11-12 LAB — POCT URINALYSIS DIPSTICK
Ketones, UA: NEGATIVE
Protein, UA: NEGATIVE

## 2013-11-12 NOTE — Telephone Encounter (Signed)
Spoke with pt. Has had backpain x 2-3 days and pain in shoulder blades. Pt states it hurts to breathe deeply. Pt to see JAG at 12:30 today. JSY

## 2013-11-12 NOTE — Assessment & Plan Note (Signed)
Muscle spasms left shoulder blade area, use ice and get sports bra ok to see chiropractor, ok to use tylenol.

## 2013-11-12 NOTE — Progress Notes (Signed)
Donna Kim is a 32 year old white female in complaining of pain in left shoulder blade area x 3 days and hurts with deep breaths, has seen chiropractor without relief.No fever or cough, US shows +FCA of 142.Lungs were clear, has point tenderness on  Left shoulder blade, Dr Emelda Fear in to co examine.Try Ice 10 minutes on then 20 minutes off, get sports bra, use tylenol and ok to see chiropractor, if not better Monday may try flexeril.Keep appt Monday for new OB visit.

## 2013-11-12 NOTE — Patient Instructions (Signed)
Muscle Cramps and Spasms Muscle cramps and spasms occur when a muscle or muscles tighten and you have no control over this tightening (involuntary muscle contraction). They are a common problem and can develop in any muscle. The most common place is in the calf muscles of the leg. Both muscle cramps and muscle spasms are involuntary muscle contractions, but they also have differences:   Muscle cramps are sporadic and painful. They may last a few seconds to a quarter of an hour. Muscle cramps are often more forceful and last longer than muscle spasms.  Muscle spasms may or may not be painful. They may also last just a few seconds or much longer. CAUSES  It is uncommon for cramps or spasms to be due to a serious underlying problem. In many cases, the cause of cramps or spasms is unknown. Some common causes are:   Overexertion.   Overuse from repetitive motions (doing the same thing over and over).   Remaining in a certain position for a long period of time.   Improper preparation, form, or technique while performing a sport or activity.   Dehydration.   Injury.   Side effects of some medicines.   Abnormally low levels of the salts and ions in your blood (electrolytes), especially potassium and calcium. This could happen if you are taking water pills (diuretics) or you are pregnant.  Some underlying medical problems can make it more likely to develop cramps or spasms. These include, but are not limited to:   Diabetes.   Parkinson disease.   Hormone disorders, such as thyroid problems.   Alcohol abuse.   Diseases specific to muscles, joints, and bones.   Blood vessel disease where not enough blood is getting to the muscles.  HOME CARE INSTRUCTIONS   Stay well hydrated. Drink enough water and fluids to keep your urine clear or pale yellow.  It may be helpful to massage, stretch, and relax the affected muscle.  For tight or tense muscles, use a warm towel, heating  pad, or hot shower water directed to the affected area.  If you are sore or have pain after a cramp or spasm, applying ice to the affected area may relieve discomfort.  Put ice in a plastic bag.  Place a towel between your skin and the bag.  Leave the ice on for 15-20 minutes, 03-04 times a day.  Medicines used to treat a known cause of cramps or spasms may help reduce their frequency or severity. Only take over-the-counter or prescription medicines as directed by your caregiver. SEEK MEDICAL CARE IF:  Your cramps or spasms get more severe, more frequent, or do not improve over time.  MAKE SURE YOU:   Understand these instructions.  Will watch your condition.  Will get help right away if you are not doing well or get worse. Document Released: 05/10/2002 Document Revised: 03/15/2013 Document Reviewed: 11/04/2012 [employer] Patient Information 2014 [place], [place]. Ice Sports bra Keep appt Ok tylenol

## 2013-11-15 ENCOUNTER — Encounter: Payer: Self-pay | Admitting: Women's Health

## 2013-11-15 ENCOUNTER — Ambulatory Visit (INDEPENDENT_AMBULATORY_CARE_PROVIDER_SITE_OTHER): Payer: BC Managed Care – PPO | Admitting: Women's Health

## 2013-11-15 ENCOUNTER — Other Ambulatory Visit (HOSPITAL_COMMUNITY)
Admission: RE | Admit: 2013-11-15 | Discharge: 2013-11-15 | Disposition: A | Discharge status: Home / Self Care | Payer: BC Managed Care – PPO | Source: Ambulatory Visit | Attending: Obstetrics & Gynecology | Admitting: Obstetrics & Gynecology

## 2013-11-15 VITALS — BP 108/80 | Wt 161.0 lb

## 2013-11-15 DIAGNOSIS — Z3481 Encounter for supervision of other normal pregnancy, first trimester: Secondary | ICD-10-CM

## 2013-11-15 DIAGNOSIS — O210 Mild hyperemesis gravidarum: Secondary | ICD-10-CM

## 2013-11-15 DIAGNOSIS — Z331 Pregnant state, incidental: Secondary | ICD-10-CM

## 2013-11-15 DIAGNOSIS — Z1389 Encounter for screening for other disorder: Secondary | ICD-10-CM

## 2013-11-15 DIAGNOSIS — Z1151 Encounter for screening for human papillomavirus (HPV): Secondary | ICD-10-CM | POA: Insufficient documentation

## 2013-11-15 DIAGNOSIS — Z01419 Encounter for gynecological examination (general) (routine) without abnormal findings: Secondary | ICD-10-CM | POA: Insufficient documentation

## 2013-11-15 DIAGNOSIS — Z3480 Encounter for supervision of other normal pregnancy, unspecified trimester: Secondary | ICD-10-CM | POA: Insufficient documentation

## 2013-11-15 DIAGNOSIS — O99891 Other specified diseases and conditions complicating pregnancy: Secondary | ICD-10-CM

## 2013-11-15 DIAGNOSIS — Z113 Encounter for screening for infections with a predominantly sexual mode of transmission: Secondary | ICD-10-CM | POA: Insufficient documentation

## 2013-11-15 LAB — CBC
HCT: 38.2 % (ref 36.0–46.0)
Hemoglobin: 13.2 g/dL (ref 12.0–15.0)
MCV: 87.2 fL (ref 78.0–100.0)
RBC: 4.38 MIL/uL (ref 3.87–5.11)
RDW: 12.9 % (ref 11.5–15.5)
WBC: 7.4 10*3/uL (ref 4.0–10.5)

## 2013-11-15 LAB — POCT URINALYSIS DIPSTICK
Ketones, UA: NEGATIVE
Leukocytes, UA: NEGATIVE

## 2013-11-15 NOTE — Progress Notes (Addendum)
  Subjective:    Donna Kim is a 32 y.o. G85P1001 Caucasian female at [redacted]w[redacted]d by LMP which correlates exactly w/ 6.1wk u/s, being seen today for her first obstetrical visit.  Her obstetrical history is significant for uncomplicated term SVD 2013.  Pregnancy history fully reviewed.   Patient reports some nausea. Denies vb, cramping, uti s/s.  Declines need for antiemetics at this time.   Filed Vitals:   11/15/13 1637  BP: 108/80  Weight: 161 lb (73.029 kg)    HISTORY: OB History  Gravida Para Term Preterm AB SAB TAB Ectopic Multiple Living  2 1 1       1     # Outcome Date GA Lbr Len/2nd Weight Sex Delivery Anes PTL Lv  2 CUR           1 TRM 01/02/12 [redacted]w[redacted]d 10:50 / 05:14 7 lb 2.6 oz (3.249 kg) F SVD EPI  Y     Comments: none     Past Medical History  Diagnosis Date  . No pertinent past medical history   . Back pain 11/12/2013   Past Surgical History  Procedure Laterality Date  . Wisdom tooth extraction     Family History  Problem Relation Age of Onset  . Adopted: Yes     Exam    Pelvic Exam:    Perineum: Normal Perineum   Vulva: normal   Vagina:  normal mucosa, normal discharge, no palpable nodules   Uterus   normal size/shape/contour for GA     Cervix: normal   Adnexa: Not palpable   Urinary: urethral meatus normal    System:     Skin: normal coloration and turgor, no rashes    Neurologic: oriented, normal mood   Extremities: normal strength, tone, and muscle mass   HEENT PERRLA   Mouth/Teeth mucous membranes moist   Cardiovascular: regular rate and rhythm   Respiratory:  appears well, vitals normal, no respiratory distress, acyanotic, normal RR   Abdomen: soft, non-tender    Thin prep pap smear obtained w/ high risk HPV cotesting  FHR: 140s via informal transabdominal u/s  Assessment:    Pregnancy: G2P1001 Patient Active Problem List   Diagnosis Date Noted  . Supervision of other normal pregnancy 11/15/2013    Priority: High  . Back pain  11/12/2013  . Acute upper respiratory infection 03/17/2013      105w1d G2P1001 New OB visit Nausea of pregnancy    Plan:     Initial labs drawn Continue prenatal vitamins Problem list reviewed and updated Reviewed n/v relief measures and warning s/s to report Reviewed recommended weight gain based on pre-gravid BMI Encouraged well-balanced diet Genetic Screening discussed Integrated Screen: declined Cystic fibrosis screening discussed declined Ultrasound discussed; fetal survey: requested Follow up in 4 weeks for visit  Marge Duncans 11/15/2013 4:56 PM

## 2013-11-15 NOTE — Patient Instructions (Signed)
Nausea & Vomiting  Have saltine crackers or pretzels by your bed and eat a few bites before you raise your head out of bed in the morning  Eat small frequent meals throughout the day instead of large meals  Drink plenty of fluids throughout the day to stay hydrated, just don't drink a lot of fluids with your meals.  This can make your stomach fill up faster making you feel sick  Do not brush your teeth right after you eat  Products with real ginger are good for nausea, like ginger ale and ginger hard candy Make sure it says made with real ginger!  Sucking on sour candy like lemon heads is also good for nausea  If your prenatal vitamins make you nauseated, take them at night so you will sleep through the nausea  If you feel like you need medicine for the nausea & vomiting please let us know  If you are unable to keep any fluids or food down please let us know    Pregnancy - First Trimester During sexual intercourse, millions of sperm go into the vagina. Only 1 sperm will penetrate and fertilize the female egg while it is in the Fallopian tube. One week later, the fertilized egg implants into the wall of the uterus. An embryo begins to develop into a baby. At 6 to 8 weeks, the eyes and face are formed and the heartbeat can be seen on ultrasound. At the end of 12 weeks (first trimester), all the baby's organs are formed. Now that you are pregnant, you will want to do everything you can to have a healthy baby. Two of the most important things are to get good prenatal care and follow your caregiver's instructions. Prenatal care is all the medical care you receive before the baby's birth. It is given to prevent, find, and treat problems during the pregnancy and childbirth. PRENATAL EXAMS  During prenatal visits, your weight, blood pressure, and urine are checked. This is done to make sure you are healthy and progressing normally during the pregnancy.  A pregnant woman should gain 25 to 35 pounds  during the pregnancy. However, if you are overweight or underweight, your caregiver will advise you regarding your weight.  Your caregiver will ask and answer questions for you.  Blood work, cervical cultures, other necessary tests, and a Pap test are done during your prenatal exams. These tests are done to check on your health and the probable health of your baby. Tests are strongly recommended and done for HIV with your permission. This is the virus that causes AIDS. These tests are done because medicines can be given to help prevent your baby from being born with this infection should you have been infected without knowing it. Blood work is also used to find out your blood type, previous infections, and follow your blood levels (hemoglobin).  Low hemoglobin (anemia) is common during pregnancy. Iron and vitamins are given to help prevent this. Later in the pregnancy, blood tests for diabetes will be done along with any other tests if any problems develop.  You may need other tests to make sure you and the baby are doing well. CHANGES DURING THE FIRST TRIMESTER  Your body goes through many changes during pregnancy. They vary from person to person. Talk to your caregiver about changes you notice and are concerned about. Changes can include:  Your menstrual period stops.  The egg and sperm carry the genes that determine what you look like. Genes from you   and your partner are forming a baby. The female genes determine whether the baby is a boy or a girl.  Your body increases in girth and you may feel bloated.  Feeling sick to your stomach (nauseous) and throwing up (vomiting). If the vomiting is uncontrollable, call your caregiver.  Your breasts will begin to enlarge and become tender.  Your nipples may stick out more and become darker.  The need to urinate more. Painful urination may mean you have a bladder infection.  Tiring easily.  Loss of appetite.  Cravings for certain kinds of  food.  At first, you may gain or lose a couple of pounds.  You may have changes in your emotions from day to day (excited to be pregnant or concerned something may go wrong with the pregnancy and baby).  You may have more vivid and strange dreams. HOME CARE INSTRUCTIONS   It is very important to avoid all smoking, alcohol and non-prescribed drugs during your pregnancy. These affect the formation and growth of the baby. Avoid chemicals while pregnant to ensure the delivery of a healthy infant.  Start your prenatal visits by the 12th week of pregnancy. They are usually scheduled monthly at first, then more often in the last 2 months before delivery. Keep your caregiver's appointments. Follow your caregiver's instructions regarding medicine use, blood and lab tests, exercise, and diet.  During pregnancy, you are providing food for you and your baby. Eat regular, well-balanced meals. Choose foods such as meat, fish, milk and other low fat dairy products, vegetables, fruits, and whole-grain breads and cereals. Your caregiver will tell you of the ideal weight gain.  You can help morning sickness by keeping soda crackers at the bedside. Eat a couple before arising in the morning. You may want to use the crackers without salt on them.  Eating 4 to 5 small meals rather than 3 large meals a day also may help the nausea and vomiting.  Drinking liquids between meals instead of during meals also seems to help nausea and vomiting.  A physical sexual relationship may be continued throughout pregnancy if there are no other problems. Problems may be early (premature) leaking of amniotic fluid from the membranes, vaginal bleeding, or belly (abdominal) pain.  Exercise regularly if there are no restrictions. Check with your caregiver or physical therapist if you are unsure of the safety of some of your exercises. Greater weight gain will occur in the last 2 trimesters of pregnancy. Exercising will  help:  Control your weight.  Keep you in shape.  Prepare you for labor and delivery.  Help you lose your pregnancy weight after you deliver your baby.  Wear a good support or jogging bra for breast tenderness during pregnancy. This may help if worn during sleep too.  Ask when prenatal classes are available. Begin classes when they are offered.  Do not use hot tubs, steam rooms, or saunas.  Wear your seat belt when driving. This protects you and your baby if you are in an accident.  Avoid raw meat, uncooked cheese, cat litter boxes, and soil used by cats throughout the pregnancy. These carry germs that can cause birth defects in the baby.  The first trimester is a good time to visit your dentist for your dental health. Getting your teeth cleaned is okay. Use a softer toothbrush and brush gently during pregnancy.  Ask for help if you have financial, counseling, or nutritional needs during pregnancy. Your caregiver will be able to offer counseling for   these needs as well as refer you for other special needs.  Do not take any medicines or herbs unless told by your caregiver.  Inform your caregiver if there is any mental or physical domestic violence.  Make a list of emergency phone numbers of family, friends, hospital, and police and fire departments.  Write down your questions. Take them to your prenatal visit.  Do not douche.  Do not cross your legs.  If you have to stand for long periods of time, rotate you feet or take small steps in a circle.  You may have more vaginal secretions that may require a sanitary pad. Do not use tampons or scented sanitary pads. MEDICINES AND DRUG USE IN PREGNANCY  Take prenatal vitamins as directed. The vitamin should contain 1 milligram of folic acid. Keep all vitamins out of reach of children. Only a couple vitamins or tablets containing iron may be fatal to a baby or young child when ingested.  Avoid use of all medicines, including herbs,  over-the-counter medicines, not prescribed or suggested by your caregiver. Only take over-the-counter or prescription medicines for pain, discomfort, or fever as directed by your caregiver. Do not use aspirin, ibuprofen, or naproxen unless directed by your caregiver.  Let your caregiver also know about herbs you may be using.  Alcohol is related to a number of birth defects. This includes fetal alcohol syndrome. All alcohol, in any form, should be avoided completely. Smoking will cause low birth rate and premature babies.  Street or illegal drugs are very harmful to the baby. They are absolutely forbidden. A baby born to an addicted mother will be addicted at birth. The baby will go through the same withdrawal an adult does.  Let your caregiver know about any medicines that you have to take and for what reason you take them. SEEK MEDICAL CARE IF:  You have any concerns or worries during your pregnancy. It is better to call with your questions if you feel they cannot wait, rather than worry about them. SEEK IMMEDIATE MEDICAL CARE IF:   An unexplained oral temperature above 102 F (38.9 C) develops, or as your caregiver suggests.  You have leaking of fluid from the vagina (birth canal). If leaking membranes are suspected, take your temperature and inform your caregiver of this when you call.  There is vaginal spotting or bleeding. Notify your caregiver of the amount and how many pads are used.  You develop a bad smelling vaginal discharge with a change in the color.  You continue to feel sick to your stomach (nauseated) and have no relief from remedies suggested. You vomit blood or coffee ground-like materials.  You lose more than 2 pounds of weight in 1 week.  You gain more than 2 pounds of weight in 1 week and you notice swelling of your face, hands, feet, or legs.  You gain 5 pounds or more in 1 week (even if you do not have swelling of your hands, face, legs, or feet).  You get  exposed to German measles and have never had them.  You are exposed to fifth disease or chickenpox.  You develop belly (abdominal) pain. Round ligament discomfort is a common non-cancerous (benign) cause of abdominal pain in pregnancy. Your caregiver still must evaluate this.  You develop headache, fever, diarrhea, pain with urination, or shortness of breath.  You fall or are in a car accident or have any kind of trauma.  There is mental or physical violence in your home. Document   Released: 11/12/2001 Document Revised: 08/12/2012 Document Reviewed: 05/16/2009 [employer] Patient Information 2014 [employer].  

## 2013-11-16 LAB — URINALYSIS
Nitrite: NEGATIVE
Specific Gravity, Urine: 1.03 (ref 1.005–1.030)
pH: 6.5 (ref 5.0–8.0)

## 2013-11-16 LAB — DRUG SCREEN, URINE, NO CONFIRMATION
Amphetamine Screen, Ur: NEGATIVE
Cocaine Metabolites: NEGATIVE
Marijuana Metabolite: NEGATIVE
Opiate Screen, Urine: NEGATIVE
Phencyclidine (PCP): NEGATIVE

## 2013-11-16 LAB — ABO AND RH: Rh Type: POSITIVE

## 2013-11-16 LAB — VARICELLA ZOSTER ANTIBODY, IGG: Varicella IgG: 1161 Index — ABNORMAL HIGH (ref <135.00)

## 2013-11-16 LAB — URINE CULTURE: Organism ID, Bacteria: NO GROWTH

## 2013-11-20 ENCOUNTER — Encounter: Payer: Self-pay | Admitting: Women's Health

## 2013-11-30 ENCOUNTER — Telehealth: Payer: Self-pay | Admitting: Obstetrics and Gynecology

## 2013-11-30 DIAGNOSIS — Z3481 Encounter for supervision of other normal pregnancy, first trimester: Secondary | ICD-10-CM

## 2013-11-30 MED ORDER — ONDANSETRON 8 MG PO TBDP: 8.0000 mg | ORAL_TABLET | Freq: Three times a day (TID) | ORAL | Status: DC | PRN

## 2013-11-30 NOTE — Telephone Encounter (Signed)
Pt states that she has the stomach flu. Pt states the symptoms started yesterday.

## 2013-12-02 NOTE — L&D Delivery Note (Signed)
Attestation of Attending Supervision of Fellow: Evaluation and management procedures were performed by the Fellow under my supervision and collaboration. I was present for the entire delivery and repair. I have reviewed the Fellow's note and chart, and I agree with the management and plan.

## 2013-12-02 NOTE — L&D Delivery Note (Signed)
Delivery Note  PRE-OPERATIVE DIAGNOSIS:  1) [redacted]w[redacted]d pregnancy.   POST-OPERATIVE DIAGNOSIS:  1) [redacted]w[redacted]d pregnancy s/p Vaginal, Spontaneous Delivery   Delivery Type: Vaginal, Spontaneous Delivery   Delivery Clinician: Blandon Offerdahl   Delivery Anesthesia: None   Labor Complications: Should Dystocia, resolved w/ McRoberts and suprapubic pressure  Lacerations: 2nd degree;Perineal;3rd degree   ESTIMATED BLOOD LOSS:  400 cc  Labor course: This is a 33 y.o. y.o. female G2P1001  who came in at [redacted]w[redacted]d pregnancy complaining of contractions.  Her prenatal course was complicated by hyperemesis.  Initial cervical exam was 8/90/-1.  She was admitted to L and D.  Labor course included:  AROM when c/c/0   Procedure: Vaginal, Spontaneous Delivery    Date of birth: 07/04/2012   Time of birth: 4:37 PM    This G2P1001 woman under no anesthesia delivered a viable female  infant with Apgars as listed below.  Delivery was via NSVD. There was a shoulder dystocia which resolved after 30 seconds with McRoberts and then suprapubic pressure.  Delivery completed and cord cut and clamped. Infant dried and stimulated. Infant to warmer. Cord pH obtained. Active management of the third stage of labor performed. Intact placenta delivered spontaneously at 8/3  4:45 PM . Vagina and rectum explored and 3rd degree laceration repaired in an normal fashion with 3.0 vicryl in 2 layers with good approximation of tissue and hemostasis.  Uterus well contracted at end of delivery.  Mother and infant tolerated delivery well.    FINDINGS:   1) female infant, Apgar scores of 8    at 1 minute 9    at 5 minutes   2) 3 Vessel Cord  3) Nuchal: No  SPECIMENS: Placenta Discared; Cord gases sent  COMPLICATIONS: Shoulder Dystocia, 3rd degree laceration  DISPOSITION:  Infant to NBN

## 2013-12-13 ENCOUNTER — Encounter: Payer: Self-pay | Admitting: Obstetrics & Gynecology

## 2013-12-13 ENCOUNTER — Ambulatory Visit (INDEPENDENT_AMBULATORY_CARE_PROVIDER_SITE_OTHER): Payer: 59 | Admitting: Obstetrics & Gynecology

## 2013-12-13 VITALS — BP 120/60 | Wt 159.0 lb

## 2013-12-13 DIAGNOSIS — Z1389 Encounter for screening for other disorder: Secondary | ICD-10-CM

## 2013-12-13 DIAGNOSIS — Z331 Pregnant state, incidental: Secondary | ICD-10-CM

## 2013-12-13 DIAGNOSIS — O9989 Other specified diseases and conditions complicating pregnancy, childbirth and the puerperium: Secondary | ICD-10-CM

## 2013-12-13 DIAGNOSIS — O99891 Other specified diseases and conditions complicating pregnancy: Secondary | ICD-10-CM

## 2013-12-13 LAB — POCT URINALYSIS DIPSTICK
Glucose, UA: NEGATIVE
Ketones, UA: NEGATIVE
Leukocytes, UA: NEGATIVE
NITRITE UA: NEGATIVE
PROTEIN UA: NEGATIVE
RBC UA: NEGATIVE

## 2013-12-13 MED ORDER — ONDANSETRON 8 MG PO TBDP: 8.0000 mg | ORAL_TABLET | Freq: Three times a day (TID) | ORAL | Status: DC | PRN

## 2013-12-13 NOTE — Progress Notes (Signed)
No bleeding Significant nausea and vomiting Declines IT  Follow up 4 weeks

## 2013-12-21 ENCOUNTER — Telehealth: Payer: Self-pay | Admitting: Adult Health

## 2013-12-21 NOTE — Telephone Encounter (Signed)
Spoke with pt. Pt states Zofran is not helping with nausea. + migraines also. Pt would like to try samples of Diclegis.

## 2013-12-21 NOTE — Telephone Encounter (Signed)
Spoke with pt letting her know she could pick up Diclegis samples at front desk per Dr. Elonda Husky. Went over instructions on how to take med. Pt voiced understanding. 

## 2013-12-21 NOTE — Telephone Encounter (Signed)
OK to give pt diclegis samples

## 2014-01-04 ENCOUNTER — Telehealth: Payer: Self-pay | Admitting: Obstetrics & Gynecology

## 2014-01-04 DIAGNOSIS — O219 Vomiting of pregnancy, unspecified: Secondary | ICD-10-CM

## 2014-01-04 MED ORDER — DOXYLAMINE-PYRIDOXINE 10-10 MG PO TBEC: 10.0000 mg | DELAYED_RELEASE_TABLET | ORAL | Status: DC

## 2014-01-04 NOTE — Telephone Encounter (Signed)
Pt requesting RX for Diclegis, gave samples but pt states would like to get a RX.

## 2014-01-05 ENCOUNTER — Telehealth: Payer: Self-pay | Admitting: *Deleted

## 2014-01-05 NOTE — Telephone Encounter (Signed)
Spoke with pt. She received a sample bottle of Diclegis yesterday. She has an appt to come in tomorrow and see Dr. Elonda Husky. Can get more samples then. Pt voiced understanding. [place]

## 2014-01-05 NOTE — Telephone Encounter (Signed)
Message copied by Linton Rump on Wed Jan 05, 2014  9:32 AM ------      Message from: Wells Guiles R      Created: Wed Jan 05, 2014  9:21 AM      Regarding: Please call       I have completed a prior auth for Donna Kim's Diclegis. It said it may take up to 5 business days to hear anything. She can come get some samples if she needs some until we know something.       Thanks! ------

## 2014-01-06 ENCOUNTER — Encounter (INDEPENDENT_AMBULATORY_CARE_PROVIDER_SITE_OTHER): Payer: Self-pay

## 2014-01-06 ENCOUNTER — Encounter: Payer: Self-pay | Admitting: Obstetrics & Gynecology

## 2014-01-06 ENCOUNTER — Ambulatory Visit (INDEPENDENT_AMBULATORY_CARE_PROVIDER_SITE_OTHER): Payer: 59 | Admitting: Obstetrics & Gynecology

## 2014-01-06 VITALS — BP 110/70 | Wt 163.0 lb

## 2014-01-06 DIAGNOSIS — Z1389 Encounter for screening for other disorder: Secondary | ICD-10-CM

## 2014-01-06 DIAGNOSIS — O9989 Other specified diseases and conditions complicating pregnancy, childbirth and the puerperium: Secondary | ICD-10-CM

## 2014-01-06 DIAGNOSIS — Z331 Pregnant state, incidental: Secondary | ICD-10-CM

## 2014-01-06 DIAGNOSIS — O99891 Other specified diseases and conditions complicating pregnancy: Secondary | ICD-10-CM

## 2014-01-06 LAB — POCT URINALYSIS DIPSTICK
GLUCOSE UA: NEGATIVE
Ketones, UA: NEGATIVE
Leukocytes, UA: NEGATIVE
Nitrite, UA: NEGATIVE
PROTEIN UA: NEGATIVE
RBC UA: NEGATIVE

## 2014-01-06 NOTE — Progress Notes (Signed)
No bleeding Having vivid dreams, to the point of bothering pt during the day, recommend trying 12.5 mg of benadryl ?flutters

## 2014-01-12 ENCOUNTER — Other Ambulatory Visit: Payer: Self-pay | Admitting: *Deleted

## 2014-01-12 ENCOUNTER — Encounter: Payer: 59 | Admitting: Obstetrics & Gynecology

## 2014-01-12 DIAGNOSIS — Z3480 Encounter for supervision of other normal pregnancy, unspecified trimester: Secondary | ICD-10-CM

## 2014-01-12 DIAGNOSIS — O219 Vomiting of pregnancy, unspecified: Secondary | ICD-10-CM

## 2014-01-12 NOTE — Telephone Encounter (Signed)
Spoke with pt and advised her of what Donna Kim advised. The pt stated that is actually isn't nauseated anymore. Pt verbalized understanding and stated that if she needed something different she would call us back.

## 2014-01-31 ENCOUNTER — Encounter: Payer: 59 | Admitting: Obstetrics & Gynecology

## 2014-01-31 ENCOUNTER — Other Ambulatory Visit: Payer: 59

## 2014-02-01 ENCOUNTER — Ambulatory Visit (INDEPENDENT_AMBULATORY_CARE_PROVIDER_SITE_OTHER): Payer: 59 | Admitting: Obstetrics & Gynecology

## 2014-02-01 ENCOUNTER — Encounter: Payer: Self-pay | Admitting: Obstetrics & Gynecology

## 2014-02-01 ENCOUNTER — Encounter: Payer: 59 | Admitting: Obstetrics & Gynecology

## 2014-02-01 ENCOUNTER — Other Ambulatory Visit: Payer: Self-pay | Admitting: Obstetrics & Gynecology

## 2014-02-01 ENCOUNTER — Ambulatory Visit (INDEPENDENT_AMBULATORY_CARE_PROVIDER_SITE_OTHER): Payer: 59

## 2014-02-01 VITALS — BP 100/70 | Wt 172.0 lb

## 2014-02-01 DIAGNOSIS — Z1389 Encounter for screening for other disorder: Secondary | ICD-10-CM

## 2014-02-01 DIAGNOSIS — Z331 Pregnant state, incidental: Secondary | ICD-10-CM

## 2014-02-01 DIAGNOSIS — O9989 Other specified diseases and conditions complicating pregnancy, childbirth and the puerperium: Secondary | ICD-10-CM

## 2014-02-01 DIAGNOSIS — Z3480 Encounter for supervision of other normal pregnancy, unspecified trimester: Secondary | ICD-10-CM

## 2014-02-01 DIAGNOSIS — O99891 Other specified diseases and conditions complicating pregnancy: Secondary | ICD-10-CM

## 2014-02-01 LAB — POCT URINALYSIS DIPSTICK
Glucose, UA: NEGATIVE
KETONES UA: NEGATIVE
Leukocytes, UA: NEGATIVE
Nitrite, UA: NEGATIVE
PROTEIN UA: NEGATIVE
RBC UA: NEGATIVE

## 2014-02-01 NOTE — Progress Notes (Signed)
BP weight and urine results all reviewed and noted. Patient reports good fetal movement, denies any bleeding and no rupture of membranes symptoms or regular contractions. Patient is without complaints. All questions were answered.  

## 2014-02-01 NOTE — Addendum Note (Signed)
Addended by: Farley Ly on: 02/01/2014 04:48 PM   Modules accepted: Orders

## 2014-02-01 NOTE — Progress Notes (Signed)
U/S(18+2wks)-single active fetus, meas c/w dates, fluid wnl, posterior Gr 0 placenta, cx appears closed (3.2cm), bilateral adnexa appears wnl, FHR-148bpm, no major abnl noted although unable to view cardiac OFT's due to fetal position woul like to reck cardiac anatomy at ~26 weeks

## 2014-03-01 ENCOUNTER — Encounter: Payer: Self-pay | Admitting: Women's Health

## 2014-03-01 ENCOUNTER — Ambulatory Visit (INDEPENDENT_AMBULATORY_CARE_PROVIDER_SITE_OTHER): Payer: 59 | Admitting: Women's Health

## 2014-03-01 VITALS — BP 110/72 | Wt 173.0 lb

## 2014-03-01 DIAGNOSIS — O99891 Other specified diseases and conditions complicating pregnancy: Secondary | ICD-10-CM

## 2014-03-01 DIAGNOSIS — Z3480 Encounter for supervision of other normal pregnancy, unspecified trimester: Secondary | ICD-10-CM

## 2014-03-01 DIAGNOSIS — O9989 Other specified diseases and conditions complicating pregnancy, childbirth and the puerperium: Secondary | ICD-10-CM

## 2014-03-01 DIAGNOSIS — Z331 Pregnant state, incidental: Secondary | ICD-10-CM

## 2014-03-01 DIAGNOSIS — Z1389 Encounter for screening for other disorder: Secondary | ICD-10-CM

## 2014-03-01 LAB — POCT URINALYSIS DIPSTICK
GLUCOSE UA: NEGATIVE
Ketones, UA: NEGATIVE
Leukocytes, UA: NEGATIVE
NITRITE UA: NEGATIVE
PROTEIN UA: NEGATIVE
RBC UA: NEGATIVE

## 2014-03-01 NOTE — Patient Instructions (Signed)
You will have your sugar test next visit.  Please do not eat or drink anything after midnight the night before you come, not even water.  You will be here for at least two hours.     Second Trimester of Pregnancy The second trimester is from week 13 through week 28, months 4 through 6. The second trimester is often a time when you feel your best. Your body has also adjusted to being pregnant, and you begin to feel better physically. Usually, morning sickness has lessened or quit completely, you may have more energy, and you may have an increase in appetite. The second trimester is also a time when the fetus is growing rapidly. At the end of the sixth month, the fetus is about 9 inches long and weighs about 1 pounds. You will likely begin to feel the baby move (quickening) between 18 and 20 weeks of the pregnancy. BODY CHANGES Your body goes through many changes during pregnancy. The changes vary from woman to woman.   Your weight will continue to increase. You will notice your lower abdomen bulging out.  You may begin to get stretch marks on your hips, abdomen, and breasts.  You may develop headaches that can be relieved by medicines approved by your caregiver.  You may urinate more often because the fetus is pressing on your bladder.  You may develop or continue to have heartburn as a result of your pregnancy.  You may develop constipation because certain hormones are causing the muscles that push waste through your intestines to slow down.  You may develop hemorrhoids or swollen, bulging veins (varicose veins).  You may have back pain because of the weight gain and pregnancy hormones relaxing your joints between the bones in your pelvis and as a result of a shift in weight and the muscles that support your balance.  Your breasts will continue to grow and be tender.  Your gums may bleed and may be sensitive to brushing and flossing.  Dark spots or blotches (chloasma, mask of pregnancy)  may develop on your face. This will likely fade after the baby is born.  A dark line from your belly button to the pubic area (linea nigra) may appear. This will likely fade after the baby is born. WHAT TO EXPECT AT YOUR PRENATAL VISITS During a routine prenatal visit:  You will be weighed to make sure you and the fetus are growing normally.  Your blood pressure will be taken.  Your abdomen will be measured to track your baby's growth.  The fetal heartbeat will be listened to.  Any test results from the previous visit will be discussed. Your caregiver may ask you:  How you are feeling.  If you are feeling the baby move.  If you have had any abnormal symptoms, such as leaking fluid, bleeding, severe headaches, or abdominal cramping.  If you have any questions. Other tests that may be performed during your second trimester include:  Blood tests that check for:  Low iron levels (anemia).  Gestational diabetes (between 24 and 28 weeks).  Rh antibodies.  Urine tests to check for infections, diabetes, or protein in the urine.  An ultrasound to confirm the proper growth and development of the baby.  An amniocentesis to check for possible genetic problems.  Fetal screens for spina bifida and Down syndrome. HOME CARE INSTRUCTIONS   Avoid all smoking, herbs, alcohol, and unprescribed drugs. These chemicals affect the formation and growth of the baby.  Follow your caregiver's  instructions regarding medicine use. There are medicines that are either safe or unsafe to take during pregnancy.  Exercise only as directed by your caregiver. Experiencing uterine cramps is a good sign to stop exercising.  Continue to eat regular, healthy meals.  Wear a good support bra for breast tenderness.  Do not use hot tubs, steam rooms, or saunas.  Wear your seat belt at all times when driving.  Avoid raw meat, uncooked cheese, cat litter boxes, and soil used by cats. These carry germs that  can cause birth defects in the baby.  Take your prenatal vitamins.  Try taking a stool softener (if your caregiver approves) if you develop constipation. Eat more high-fiber foods, such as fresh vegetables or fruit and whole grains. Drink plenty of fluids to keep your urine clear or pale yellow.  Take warm sitz baths to soothe any pain or discomfort caused by hemorrhoids. Use hemorrhoid cream if your caregiver approves.  If you develop varicose veins, wear support hose. Elevate your feet for 15 minutes, 3 4 times a day. Limit salt in your diet.  Avoid heavy lifting, wear low heel shoes, and practice good posture.  Rest with your legs elevated if you have leg cramps or low back pain.  Visit your dentist if you have not gone yet during your pregnancy. Use a soft toothbrush to brush your teeth and be gentle when you floss.  A sexual relationship may be continued unless your caregiver directs you otherwise.  Continue to go to all your prenatal visits as directed by your caregiver. SEEK MEDICAL CARE IF:   You have dizziness.  You have mild pelvic cramps, pelvic pressure, or nagging pain in the abdominal area.  You have persistent nausea, vomiting, or diarrhea.  You have a bad smelling vaginal discharge.  You have pain with urination. SEEK IMMEDIATE MEDICAL CARE IF:   You have a fever.  You are leaking fluid from your vagina.  You have spotting or bleeding from your vagina.  You have severe abdominal cramping or pain.  You have rapid weight gain or loss.  You have shortness of breath with chest pain.  You notice sudden or extreme swelling of your face, hands, ankles, feet, or legs.  You have not felt your baby move in over an hour.  You have severe headaches that do not go away with medicine.  You have vision changes. Document Released: 11/12/2001 Document Revised: 07/21/2013 Document Reviewed: 01/19/2013 [facility] Patient Information 2014 [place].

## 2014-03-01 NOTE — Progress Notes (Signed)
Reports good fm. Denies uc's, lof, vb, uti s/s.  Facial acne, doesn't want to use medications/creams. Interested in herbal/natural remedies, gave me a list, I will try to research to see if safe during pregnancy and let her know.  Reviewed ptl s/s, fm.  All questions answered. F/U in 4wks for pn2, u/s to f/u cardiac OFTs, and visit.

## 2014-03-01 NOTE — Progress Notes (Signed)
Pt denies any problems or concerns at this time. Wants to discuss some hormone issues.

## 2014-03-14 ENCOUNTER — Telehealth: Payer: Self-pay | Admitting: Obstetrics and Gynecology

## 2014-03-14 NOTE — Telephone Encounter (Signed)
Pt informed can take Claritin or Zyrtec for allergies, if no improvement call our office back. Pt verbalized understanding.

## 2014-03-18 ENCOUNTER — Encounter (HOSPITAL_COMMUNITY): Payer: Self-pay | Admitting: Emergency Medicine

## 2014-03-18 ENCOUNTER — Emergency Department (HOSPITAL_COMMUNITY)
Admission: EM | Admit: 2014-03-18 | Discharge: 2014-03-18 | Disposition: A | Discharge status: Home / Self Care | Payer: 59 | Attending: Emergency Medicine | Admitting: Emergency Medicine

## 2014-03-18 ENCOUNTER — Ambulatory Visit (INDEPENDENT_AMBULATORY_CARE_PROVIDER_SITE_OTHER): Payer: 59 | Admitting: Obstetrics and Gynecology

## 2014-03-18 ENCOUNTER — Other Ambulatory Visit: Payer: Self-pay | Admitting: Obstetrics and Gynecology

## 2014-03-18 VITALS — BP 100/64 | Wt 176.0 lb

## 2014-03-18 DIAGNOSIS — Z331 Pregnant state, incidental: Secondary | ICD-10-CM

## 2014-03-18 DIAGNOSIS — R1915 Other abnormal bowel sounds: Secondary | ICD-10-CM | POA: Insufficient documentation

## 2014-03-18 DIAGNOSIS — R197 Diarrhea, unspecified: Secondary | ICD-10-CM | POA: Insufficient documentation

## 2014-03-18 DIAGNOSIS — Z1389 Encounter for screening for other disorder: Secondary | ICD-10-CM

## 2014-03-18 DIAGNOSIS — J3489 Other specified disorders of nose and nasal sinuses: Secondary | ICD-10-CM | POA: Insufficient documentation

## 2014-03-18 DIAGNOSIS — R112 Nausea with vomiting, unspecified: Secondary | ICD-10-CM

## 2014-03-18 DIAGNOSIS — O210 Mild hyperemesis gravidarum: Secondary | ICD-10-CM | POA: Insufficient documentation

## 2014-03-18 DIAGNOSIS — O9989 Other specified diseases and conditions complicating pregnancy, childbirth and the puerperium: Secondary | ICD-10-CM

## 2014-03-18 DIAGNOSIS — L719 Rosacea, unspecified: Secondary | ICD-10-CM | POA: Insufficient documentation

## 2014-03-18 DIAGNOSIS — O99891 Other specified diseases and conditions complicating pregnancy: Secondary | ICD-10-CM

## 2014-03-18 MED ORDER — ONDANSETRON HCL 4 MG/2ML IJ SOLN
INTRAMUSCULAR | Status: AC
  Filled 2014-03-18: qty 2

## 2014-03-18 MED ORDER — METRONIDAZOLE 1 % EX CREA: TOPICAL_CREAM | Freq: Every day | CUTANEOUS | Status: DC

## 2014-03-18 MED ORDER — ONDANSETRON HCL 4 MG PO TABS: 4.0000 mg | ORAL_TABLET | Freq: Four times a day (QID) | ORAL | Status: DC

## 2014-03-18 MED ORDER — ACETAMINOPHEN 325 MG PO TABS
650.0000 mg | ORAL_TABLET | Freq: Once | ORAL | Status: AC
  Administered 2014-03-18: 650 mg via ORAL
  Filled 2014-03-18: qty 2

## 2014-03-18 MED ORDER — SODIUM CHLORIDE 0.9 % IV BOLUS (SEPSIS)
1000.0000 mL | Freq: Once | INTRAVENOUS | Status: AC
  Administered 2014-03-18: 1000 mL via INTRAVENOUS

## 2014-03-18 MED ORDER — PROMETHAZINE HCL 25 MG PO TABS: 25.0000 mg | ORAL_TABLET | Freq: Four times a day (QID) | ORAL | Status: DC | PRN

## 2014-03-18 MED ORDER — ONDANSETRON HCL 4 MG/2ML IJ SOLN
4.0000 mg | Freq: Once | INTRAMUSCULAR | Status: AC
  Administered 2014-03-18: 4 mg via INTRAVENOUS

## 2014-03-18 MED ORDER — ONDANSETRON HCL 4 MG/2ML IJ SOLN
4.0000 mg | Freq: Once | INTRAMUSCULAR | Status: AC
  Administered 2014-03-18: 4 mg via INTRAVENOUS
  Filled 2014-03-18: qty 2

## 2014-03-18 NOTE — Discharge Instructions (Signed)
Viral Gastroenteritis Viral gastroenteritis is also known as stomach flu. This condition affects the stomach and intestinal tract. It can cause sudden diarrhea and vomiting. The illness typically lasts 3 to 8 days. Most people develop an immune response that eventually gets rid of the virus. While this natural response develops, the virus can make you quite ill. CAUSES  Many different viruses can cause gastroenteritis, such as rotavirus or noroviruses. You can catch one of these viruses by consuming contaminated food or water. You may also catch a virus by sharing utensils or other personal items with an infected person or by touching a contaminated surface. SYMPTOMS  The most common symptoms are diarrhea and vomiting. These problems can cause a severe loss of body fluids (dehydration) and a body salt (electrolyte) imbalance. Other symptoms may include:  Fever.  Headache.  Fatigue.  Abdominal pain. DIAGNOSIS  Your caregiver can usually diagnose viral gastroenteritis based on your symptoms and a physical exam. A stool sample may also be taken to test for the presence of viruses or other infections. TREATMENT  This illness typically goes away on its own. Treatments are aimed at rehydration. The most serious cases of viral gastroenteritis involve vomiting so severely that you are not able to keep fluids down. In these cases, fluids must be given through an intravenous line (IV). HOME CARE INSTRUCTIONS   Drink enough fluids to keep your urine clear or pale yellow. Drink small amounts of fluids frequently and increase the amounts as tolerated.  Ask your caregiver for specific rehydration instructions.  Avoid:  Foods high in sugar.  Alcohol.  Carbonated drinks.  Tobacco.  Juice.  Caffeine drinks.  Extremely hot or cold fluids.  Fatty, greasy foods.  Too much intake of anything at one time.  Dairy products until 24 to 48 hours after diarrhea stops.  You may consume probiotics.  Probiotics are active cultures of beneficial bacteria. They may lessen the amount and number of diarrheal stools in adults. Probiotics can be found in yogurt with active cultures and in supplements.  Wash your hands well to avoid spreading the virus.  Only take over-the-counter or prescription medicines for pain, discomfort, or fever as directed by your caregiver. Do not give aspirin to children. Antidiarrheal medicines are not recommended.  Ask your caregiver if you should continue to take your regular prescribed and over-the-counter medicines.  Keep all follow-up appointments as directed by your caregiver. SEEK IMMEDIATE MEDICAL CARE IF:   You are unable to keep fluids down.  You do not urinate at least once every 6 to 8 hours.  You develop shortness of breath.  You notice blood in your stool or vomit. This may look like coffee grounds.  You have abdominal pain that increases or is concentrated in one small area (localized).  You have persistent vomiting or diarrhea.  You have a fever.  The patient is a child younger than 3 months, and he or she has a fever.  The patient is a child older than 3 months, and he or she has a fever and persistent symptoms.  The patient is a child older than 3 months, and he or she has a fever and symptoms suddenly get worse.  The patient is a baby, and he or she has no tears when crying. MAKE SURE YOU:   Understand these instructions.  Will watch your condition.  Will get help right away if you are not doing well or get worse. Document Released: 11/18/2005 Document Revised: 02/10/2012 Document Reviewed: 09/04/2011   [employer] Patient Information 2014 [employer].  

## 2014-03-18 NOTE — ED Notes (Signed)
Given Ice Chips and sprite for PO challenge.

## 2014-03-18 NOTE — ED Notes (Signed)
Patient with no complaints at this time. Respirations even and unlabored. Skin warm/dry. Discharge instructions reviewed with patient at this time. Patient given opportunity to voice concerns/ask questions. IV removed per policy and band-aid applied to site. Patient discharged at this time and left Emergency Department with steady gait.  

## 2014-03-18 NOTE — Progress Notes (Signed)
109w5d. G2P1. +nausea, emesis and diarrhea since 10 PM last night. No improvement with Pepto at home and was intolerant of fluids and foods. Seen at [facility] ED this morning. Dx'd with dehydration, given 1L IV fluids. Given Zofran. Also reports ongoing morning sickness. Reports vomiting undigested proteins and carbs. Wants phenergan instead of Zofran.   A: hyperemesis gravidarum   Apparent facial rosacea   P: will consider Reglan if nausea continues   Metronidazole 1% gel for rosacea

## 2014-03-18 NOTE — ED Notes (Signed)
Pt c/o vomiting and diarrhea since last night.

## 2014-03-18 NOTE — ED Notes (Signed)
Patient c/o increasing nausea. Verbal order for 4 mg Zofran IV obtained via Dr Marnette Burgess.

## 2014-03-18 NOTE — Progress Notes (Signed)
Pt states she was just seen in the ED for dehydration, pt was given fluids. Pt states that she feels very nauseated. Nausea and vomiting since 10 last night.

## 2014-03-18 NOTE — ED Provider Notes (Signed)
CSN: [id number]     Arrival date & time 03/18/14  6333 History   First MD Initiated Contact with Patient 03/18/14 0617     Chief Complaint  Patient presents with  . Diarrhea  . Emesis     (Consider location/radiation/quality/duration/timing/severity/associated sxs/prior Treatment) HPI History provided by patient. Nausea, vomiting and diarrhea onset around 10:30 PM last night and persistent. No associated fevers or chills. She has some discomfort associated with emesis but no abdominal pain or cramping otherwise. No blood in emesis or stools. No recent travel. She is currently [redacted] weeks pregnant and denies any complications of pregnancy. No vaginal bleeding. No pelvic pain. Her daughter was sick at home with the same symptoms recently. Her daughter's friend and also had the same illness. Symptoms moderate in severity. Unable to hold anything down this morning and she became more concerned.  Past Medical History  Diagnosis Date  . No pertinent past medical history   . Back pain 11/12/2013   Past Surgical History  Procedure Laterality Date  . Wisdom tooth extraction     Family History  Problem Relation Age of Onset  . Adopted: Yes   History  Substance Use Topics  . Smoking status: Never Smoker   . Smokeless tobacco: Never Used  . Alcohol Use: No   OB History   Grav Para Term Preterm Abortions TAB SAB Ect Mult Living   2 1 1       1      Review of Systems  Constitutional: Negative for fever and chills.  Respiratory: Negative for shortness of breath.   Cardiovascular: Negative for chest pain.  Gastrointestinal: Negative for abdominal pain and blood in stool.  Genitourinary: Negative for flank pain.  Musculoskeletal: Negative for back pain.  Skin: Negative for rash.  Neurological: Negative for syncope and weakness.  All other systems reviewed and are negative.     Allergies  Review of patient's allergies indicates no known allergies.  Home Medications   Prior to  Admission medications   Medication Sig Start Date End Date Taking? Authorizing Provider  acetaminophen (TYLENOL) 325 MG tablet Take 650 mg by mouth every 6 (six) hours as needed. Patient took this medication for pain.    Yes Historical Provider, MD  Bismuth Subsalicylate (PEPTO-BISMOL PO) Take by mouth as needed.   Yes Historical Provider, MD  Prenatal Vit-Fe Sulfate-FA (PRENATAL VITAMIN PO) Take by mouth.   Yes Historical Provider, MD  Doxylamine-Pyridoxine (DICLEGIS) 10-10 MG TBEC Take 10 mg by mouth See admin instructions. 01/04/14   Tawnya Crook, CNM  ondansetron (ZOFRAN ODT) 4 MG disintegrating tablet 4mg  ODT q6 hours prn nausea/vomit 03/06/13   Maudry Diego, MD  ondansetron (ZOFRAN ODT) 8 MG disintegrating tablet Take 1 tablet (8 mg total) by mouth every 8 (eight) hours as needed for nausea or vomiting. 12/13/13   Florian Buff, MD   BP 121/72  Pulse 107  Temp(Src) 98.1 F (36.7 C) (Oral)  Resp 18  Ht 5' 4.5" (1.638 m)  Wt 172 lb (78.019 kg)  BMI 29.08 kg/m2  SpO2 96%  LMP 09/26/2013 Physical Exam  Constitutional: She is oriented to person, place, and time. She appears well-developed and well-nourished.  HENT:  Head: Normocephalic and atraumatic.  Mildly dry mucous membranes  Eyes: EOM are normal. Pupils are equal, round, and reactive to light. No scleral icterus.  Neck: Neck supple.  Cardiovascular: Normal rate, regular rhythm and intact distal pulses.   Pulmonary/Chest: Effort normal and breath sounds normal. No  respiratory distress.  Abdominal: Soft. She exhibits no distension. There is no tenderness. There is no rebound and no guarding.  Gravid consistent with dates, hyperactive bowel sounds  Musculoskeletal: Normal range of motion. She exhibits no edema.  Neurological: She is alert and oriented to person, place, and time.  Skin: Skin is warm and dry.    ED Course  Procedures (including critical care time) Labs Review Labs Reviewed - No data to  display  Imaging Review No results found.  IV fluids. IV Zofran. Fetal heart rate 142  Patient requesting ice chips which were provided.   Symptoms improving and PT feels comfortable with plan d/c hoe, f/u OB GYN. RX zofran as needed. Strict return precautions verbalized as understood.    MDM   Final diagnoses:  Nausea vomiting and diarrhea   Suspect viral gastroenteritis with multiple sick contacts with the same symptoms. Given [redacted] weeks pregnant treated with IVFs and antiemetics. Condition improved, tolerates POs. Stable and appropriate for discharge and close outpatient follow up. VS and nurses notes reviewed and considered.     Teressa Lower, MD 03/19/14 [phone]

## 2014-03-28 ENCOUNTER — Other Ambulatory Visit: Payer: 59

## 2014-03-28 ENCOUNTER — Encounter: Payer: 59 | Admitting: Women's Health

## 2014-03-29 ENCOUNTER — Other Ambulatory Visit: Payer: 59

## 2014-03-29 ENCOUNTER — Ambulatory Visit (INDEPENDENT_AMBULATORY_CARE_PROVIDER_SITE_OTHER): Payer: 59 | Admitting: Women's Health

## 2014-03-29 ENCOUNTER — Ambulatory Visit (INDEPENDENT_AMBULATORY_CARE_PROVIDER_SITE_OTHER): Payer: 59

## 2014-03-29 ENCOUNTER — Other Ambulatory Visit: Payer: Self-pay | Admitting: Women's Health

## 2014-03-29 ENCOUNTER — Encounter: Payer: Self-pay | Admitting: Women's Health

## 2014-03-29 VITALS — BP 100/54 | Wt 180.2 lb

## 2014-03-29 DIAGNOSIS — Z331 Pregnant state, incidental: Secondary | ICD-10-CM

## 2014-03-29 DIAGNOSIS — Z3480 Encounter for supervision of other normal pregnancy, unspecified trimester: Secondary | ICD-10-CM

## 2014-03-29 DIAGNOSIS — Z1389 Encounter for screening for other disorder: Secondary | ICD-10-CM

## 2014-03-29 DIAGNOSIS — O99891 Other specified diseases and conditions complicating pregnancy: Secondary | ICD-10-CM

## 2014-03-29 DIAGNOSIS — O358XX0 Maternal care for other (suspected) fetal abnormality and damage, not applicable or unspecified: Secondary | ICD-10-CM

## 2014-03-29 DIAGNOSIS — O99019 Anemia complicating pregnancy, unspecified trimester: Secondary | ICD-10-CM

## 2014-03-29 DIAGNOSIS — O210 Mild hyperemesis gravidarum: Secondary | ICD-10-CM

## 2014-03-29 DIAGNOSIS — O9989 Other specified diseases and conditions complicating pregnancy, childbirth and the puerperium: Secondary | ICD-10-CM

## 2014-03-29 LAB — POCT URINALYSIS DIPSTICK
GLUCOSE UA: NEGATIVE
KETONES UA: NEGATIVE
Leukocytes, UA: NEGATIVE
Nitrite, UA: NEGATIVE
Protein, UA: NEGATIVE
RBC UA: NEGATIVE

## 2014-03-29 LAB — CBC
HEMATOCRIT: 32.7 % — AB (ref 36.0–46.0)
Hemoglobin: 11.5 g/dL — ABNORMAL LOW (ref 12.0–15.0)
MCH: 30.2 pg (ref 26.0–34.0)
MCHC: 35.2 g/dL (ref 30.0–36.0)
MCV: 85.8 fL (ref 78.0–100.0)
Platelets: 150 10*3/uL (ref 150–400)
RBC: 3.81 MIL/uL — ABNORMAL LOW (ref 3.87–5.11)
RDW: 13.1 % (ref 11.5–15.5)
WBC: 10.8 10*3/uL — ABNORMAL HIGH (ref 4.0–10.5)

## 2014-03-29 NOTE — Progress Notes (Signed)
U/S(26+2wks)-vtx active fetus, approp growth EFW 2 lb 8 oz (75th%tile), fluid wnl Single Deepest Pocket-4+ cm, FHR 134 bpm, female fetus, cardiac OFT's viewed today, posterior Gr 1 placenta

## 2014-03-29 NOTE — Patient Instructions (Signed)
Rosacea Rosacea is a long-term (chronic) condition that affects the skin of the face (cheeks, nose, brow, and chin) and sometimes the eyes. Rosacea causes the blood vessels near the surface of the skin to enlarge, resulting in redness. This condition usually begins after age 30. It occurs most often in light-skinned women. Without treatment, rosacea tends to get worse over time. There is no cure for rosacea, but treatment can help control your symptoms. CAUSES  The cause is unknown. It is thought that some people may inherit a tendency to develop rosacea. Certain triggers can make your rosacea worse, including:  Hot baths.  Exercise.  Sunlight.  Very hot or cold temperatures.  Hot or spicy foods and drinks.  Drinking alcohol.  Stress.  Taking blood pressure medicine.  Long-term use of topical steroids on the face. SYMPTOMS   Redness of the face.  Red bumps or pimples on the face.  Red, enlarged nose (rhinophyma).  Blushing easily.  Red lines on the skin.  Irritated or burning feeling in the eyes.  Swollen eyelids. DIAGNOSIS  Your caregiver can usually tell what is wrong by asking about your symptoms and performing a physical exam. TREATMENT  Avoiding triggers is an important part of treatment. You will also need to see a skin specialist (dermatologist) who can develop a treatment plan for you. The goals of treatment are to control your condition and to improve the appearance of your skin. It may take several weeks or months of treatment before you notice an improvement in your skin. Even after your skin improves, you will likely need to continue treatment to prevent your rosacea from coming back. Treatment methods may include:  Using sunscreen or sunblock daily to protect the skin.  Antibiotic medicine, such as metronidazole, applied directly to the skin.  Antibiotics taken by mouth. This is usually prescribed if you have eye problems from your rosacea.  Laser surgery  to improve the appearance of the skin. This surgery can reduce the appearance of red lines on the skin and can remove excess tissue from the nose to reduce its size. HOME CARE INSTRUCTIONS  Avoid things that seem to trigger your flare-ups.  If you are given antibiotics, take them as directed. Finish them even if you start to feel better.  Use a gentle facial cleanser that does not contain alcohol.  You may use a mild facial moisturizer.  Use a sunscreen or sunblock with SPF 30 or greater.  Wear a green-tinted foundation powder to conceal redness, if needed. Choose cosmetics that are noncomedogenic. This means they do not block your pores.  If your eyelids are affected, apply warm compresses to the eyelids. Do this up to 4 times a day or as directed by your caregiver. SEEK MEDICAL CARE IF:  Your skin problems get worse.  You feel depressed.  You lose your appetite.  You have trouble concentrating.  You have problems with your eyes, such as redness or itching. MAKE SURE YOU:  Understand these instructions.  Will watch your condition.  Will get help right away if you are not doing well or get worse. Document Released: 12/26/2004 Document Revised: 05/19/2012 Document Reviewed: 10/29/2011 [employer] Patient Information 2014 [employer].  

## 2014-03-29 NOTE — Progress Notes (Addendum)
Reports good fm. Denies uc's, lof, vb, uti s/s.  Nausea much better! Has been using topical cream for rosacea as given by [facility], states it is helping.  Discussed weight gain- eats lots of carbs, recommended decreasing. Reviewed today's u/s- was able to see OFTs- normal, ptl s/s, fkc.  All questions answered. F/U in 4wks for visit.  PN2 today.

## 2014-03-30 ENCOUNTER — Encounter: Payer: Self-pay | Admitting: Women's Health

## 2014-03-30 LAB — RPR

## 2014-03-30 LAB — HSV 2 ANTIBODY, IGG

## 2014-03-30 LAB — ANTIBODY SCREEN: Antibody Screen: NEGATIVE

## 2014-03-30 LAB — HIV ANTIBODY (ROUTINE TESTING W REFLEX): HIV 1&2 Ab, 4th Generation: NONREACTIVE

## 2014-03-30 LAB — GLUCOSE TOLERANCE, 2 HOURS W/ 1HR
Glucose, 1 hour: 84 mg/dL (ref 70–170)
Glucose, 2 hour: 105 mg/dL (ref 70–139)
Glucose, Fasting: 76 mg/dL (ref 70–99)

## 2014-03-31 ENCOUNTER — Encounter: Payer: 59 | Admitting: Advanced Practice Midwife

## 2014-03-31 ENCOUNTER — Other Ambulatory Visit: Payer: 59

## 2014-04-26 ENCOUNTER — Ambulatory Visit (INDEPENDENT_AMBULATORY_CARE_PROVIDER_SITE_OTHER): Payer: 59 | Admitting: Obstetrics & Gynecology

## 2014-04-26 ENCOUNTER — Encounter: Payer: Self-pay | Admitting: Obstetrics & Gynecology

## 2014-04-26 VITALS — BP 100/62 | Wt 189.0 lb

## 2014-04-26 DIAGNOSIS — Z331 Pregnant state, incidental: Secondary | ICD-10-CM

## 2014-04-26 DIAGNOSIS — Z1389 Encounter for screening for other disorder: Secondary | ICD-10-CM

## 2014-04-26 DIAGNOSIS — Z3480 Encounter for supervision of other normal pregnancy, unspecified trimester: Secondary | ICD-10-CM

## 2014-04-26 LAB — POCT URINALYSIS DIPSTICK
Blood, UA: NEGATIVE
GLUCOSE UA: NEGATIVE
KETONES UA: NEGATIVE
Leukocytes, UA: NEGATIVE
Nitrite, UA: NEGATIVE
Protein, UA: NEGATIVE

## 2014-04-26 NOTE — Progress Notes (Signed)
G2P1001 [redacted]w[redacted]d Estimated Date of Delivery: 07/03/14   BP weight and urine results all reviewed and noted. Patient reports good fetal movement, denies any bleeding and no rupture of membranes symptoms or regular contractions.  Patient is without complaints. All questions were answered.  Follow up in 2 weeks for routine OB appt

## 2014-04-28 ENCOUNTER — Inpatient Hospital Stay (HOSPITAL_COMMUNITY)
Admission: AD | Admit: 2014-04-28 | Discharge: 2014-04-28 | Disposition: A | Discharge status: Home / Self Care | Payer: 59 | Source: Ambulatory Visit | Attending: Obstetrics & Gynecology | Admitting: Obstetrics & Gynecology

## 2014-04-28 ENCOUNTER — Encounter (HOSPITAL_COMMUNITY): Payer: Self-pay | Admitting: *Deleted

## 2014-04-28 ENCOUNTER — Telehealth: Payer: Self-pay | Admitting: *Deleted

## 2014-04-28 DIAGNOSIS — W19XXXA Unspecified fall, initial encounter: Secondary | ICD-10-CM

## 2014-04-28 DIAGNOSIS — O9989 Other specified diseases and conditions complicating pregnancy, childbirth and the puerperium: Principal | ICD-10-CM

## 2014-04-28 DIAGNOSIS — Y92009 Unspecified place in unspecified non-institutional (private) residence as the place of occurrence of the external cause: Secondary | ICD-10-CM

## 2014-04-28 DIAGNOSIS — Y929 Unspecified place or not applicable: Secondary | ICD-10-CM | POA: Insufficient documentation

## 2014-04-28 DIAGNOSIS — W108XXA Fall (on) (from) other stairs and steps, initial encounter: Secondary | ICD-10-CM | POA: Insufficient documentation

## 2014-04-28 DIAGNOSIS — O99891 Other specified diseases and conditions complicating pregnancy: Secondary | ICD-10-CM

## 2014-04-28 LAB — URINALYSIS, ROUTINE W REFLEX MICROSCOPIC
Bilirubin Urine: NEGATIVE
Glucose, UA: NEGATIVE mg/dL
Hgb urine dipstick: NEGATIVE
Ketones, ur: NEGATIVE mg/dL
LEUKOCYTES UA: NEGATIVE
NITRITE: NEGATIVE
PH: 6.5 (ref 5.0–8.0)
PROTEIN: NEGATIVE mg/dL
Specific Gravity, Urine: 1.025 (ref 1.005–1.030)
Urobilinogen, UA: 1 mg/dL (ref 0.0–1.0)

## 2014-04-28 MED ORDER — CYCLOBENZAPRINE HCL 5 MG PO TABS
5.0000 mg | ORAL_TABLET | Freq: Once | ORAL | Status: AC
  Administered 2014-04-28: 5 mg via ORAL
  Filled 2014-04-28: qty 1

## 2014-04-28 NOTE — Discharge Instructions (Signed)
Fall Prevention in Hospitals  As a hospital patient, your condition and the treatments you receive can increase your risk for falls. Some additional risk factors for falls in a hospital include:   Being in an unfamiliar environment.   Being on bed rest.   Your surgery.   Taking certain medicines.   Your tubing requirements, such as intravenous (IV) therapy or catheters.  It is important that you learn how to decrease fall risks while at the hospital. Below are important tips that can help prevent falls.  SAFETY TIPS FOR PREVENTING FALLS  Talk about your risk of falling.   Ask your caregiver why you are at risk for falling. Is it your medicine, illness, tubing placement, or something else?   Make a plan with your caregiver to keep you safe from falls.   Ask your caregiver or pharmacist about side effect of your medicines. Some medicines can make you dizzy or affect your coordination.  Ask for help.   Ask for help before getting out of bed. You may need to press your call button.   Ask for assistance in getting you safely to the toilet.   Ask for a walker or cane to be put at your bedside. Ask that most of the side rails on your bed be placed up before your caregiver leaves the room.   Ask family or friends to sit with you.   Ask for things that are out of your reach, such as your glasses, hearing aids, telephone, bedside table, or call button.  Follow these tips to avoid falling:   Stay lying or seated, rather than standing, while waiting for help.   Wear rubber-soled slippers or shoes whenever you walk in the hospital.   Avoid quick, sudden movements.   Change positions slowly.   Sit on the side of your bed before standing.   Stand up slowly and wait before you start to walk.   Let your caregiver know if there is a spill on the floor.   Pay careful attention to the medical equipment, electrical cords, and tubes around you.   When you need help, use your call button by your bed or in the  bathroom. Wait for one of your caregivers to help you.   If you feel dizzy or unsure of your footing, return to bed and wait for assistance.   Avoid being distracted by the TV, telephone, or another person in your room.   Do not lean or support yourself on rolling objects, such as IV poles or bedside tables.  Document Released: 11/15/2000 Document Revised: 11/04/2012 Document Reviewed: 07/26/2012  [employer] Patient Information 2014 [employer].

## 2014-04-28 NOTE — MAU Note (Signed)
Pt states when she fell, she twisted to avoid falling on abd and on toddler.  Pain started after fall immediately.  1000mg  of Tylenol taken about 1700.

## 2014-04-28 NOTE — MAU Note (Signed)
Patient states she was walking up stairs carrying her toddler and missed the last step and fell. Thinks she hit the right side of her abdomen and now having abdominal and back pain. Scraped both knees. Denies leaking or bleeding.

## 2014-04-28 NOTE — Telephone Encounter (Signed)
Pt states tripped going up stairs, landed on side, c/o "lower pain in lower belly, feels the baby hick upping", no vaginal bleeding. Per Dr. Elonda Husky pt needs to go to [facility] to be evaluated. Pt verbalized understanding.

## 2014-05-10 ENCOUNTER — Encounter: Payer: Self-pay | Admitting: Obstetrics & Gynecology

## 2014-05-10 ENCOUNTER — Ambulatory Visit (INDEPENDENT_AMBULATORY_CARE_PROVIDER_SITE_OTHER): Payer: 59 | Admitting: Obstetrics & Gynecology

## 2014-05-10 VITALS — BP 118/80 | Wt 189.0 lb

## 2014-05-10 DIAGNOSIS — Z3480 Encounter for supervision of other normal pregnancy, unspecified trimester: Secondary | ICD-10-CM

## 2014-05-10 DIAGNOSIS — Z1389 Encounter for screening for other disorder: Secondary | ICD-10-CM

## 2014-05-10 DIAGNOSIS — Z331 Pregnant state, incidental: Secondary | ICD-10-CM

## 2014-05-10 LAB — POCT URINALYSIS DIPSTICK
Blood, UA: NEGATIVE
GLUCOSE UA: NEGATIVE
Ketones, UA: NEGATIVE
LEUKOCYTES UA: NEGATIVE
Nitrite, UA: NEGATIVE
Protein, UA: NEGATIVE

## 2014-05-10 NOTE — Progress Notes (Signed)
G2P1001 [redacted]w[redacted]d Estimated Date of Delivery: 07/03/14  Blood pressure 118/80, weight 189 lb (85.73 kg), last menstrual period 09/26/2013, unknown if currently breastfeeding.   BP weight and urine results all reviewed and noted.  Please refer to the obstetrical flow sheet for the fundal height and fetal heart rate documentation:  Patient reports good fetal movement, denies any bleeding and no rupture of membranes symptoms or regular contractions. Patient is without complaints. All questions were answered.  Plan:  Continued routine obstetrical care,   Follow up in 2 weeks for OB appointment, routine

## 2014-05-24 ENCOUNTER — Telehealth: Payer: Self-pay | Admitting: Family Medicine

## 2014-05-24 ENCOUNTER — Ambulatory Visit (INDEPENDENT_AMBULATORY_CARE_PROVIDER_SITE_OTHER): Payer: 59 | Admitting: Women's Health

## 2014-05-24 VITALS — BP 120/70 | Wt 191.0 lb

## 2014-05-24 DIAGNOSIS — O26843 Uterine size-date discrepancy, third trimester: Secondary | ICD-10-CM

## 2014-05-24 DIAGNOSIS — Z3480 Encounter for supervision of other normal pregnancy, unspecified trimester: Secondary | ICD-10-CM

## 2014-05-24 DIAGNOSIS — Z331 Pregnant state, incidental: Secondary | ICD-10-CM

## 2014-05-24 DIAGNOSIS — Z3483 Encounter for supervision of other normal pregnancy, third trimester: Secondary | ICD-10-CM

## 2014-05-24 DIAGNOSIS — O26849 Uterine size-date discrepancy, unspecified trimester: Secondary | ICD-10-CM

## 2014-05-24 DIAGNOSIS — Z1389 Encounter for screening for other disorder: Secondary | ICD-10-CM

## 2014-05-24 LAB — POCT URINALYSIS DIPSTICK
GLUCOSE UA: NEGATIVE
Ketones, UA: NEGATIVE
Leukocytes, UA: NEGATIVE
NITRITE UA: NEGATIVE
PROTEIN UA: NEGATIVE
RBC UA: NEGATIVE

## 2014-05-24 NOTE — Telephone Encounter (Signed)
Pt needs letter from [place] in addition to the letter from her  OB/GYN (see attached) that is ok for her to see an  Prenatal Chiropractor.   Insurance is needing this letter in order to pay for the visits.

## 2014-05-24 NOTE — Progress Notes (Signed)
Low-risk OB appointment G2P1001 [redacted]w[redacted]d Estimated Date of Delivery: 07/03/14 Blood pressure 120/70, weight 191 lb (86.637 kg), last menstrual period 09/26/2013, unknown if currently breastfeeding.  BP, weight, and urine reviewed.  Refer to obstetrical flow sheet for FH & FHR. FH S<D. Reports good fm.  Denies regular uc's, lof, vb, or uti s/s. RLP and LBP, has been seeing regular chiropractor, and began seeing prenatal chiropractor yesterday. Needs letter stating this is ok.  Has pregnancy belt, recommended getting one w/ shoulder straps/support.  Reviewed ptl s/s, fkc. Plan:  Growth/afi u/s for s<d, Continue routine obstetrical care, ok to continue prenatal chiropractor F/U asap for growth/afi u/s, then 2wks for OB appointment and gbs

## 2014-05-24 NOTE — Patient Instructions (Signed)
Call the office ([phone]) or go to [facility] if:  You begin to have strong, frequent contractions  Your water breaks.  Sometimes it is a big gush of fluid, sometimes it is just a trickle that keeps getting your panties wet or running down your legs  You have vaginal bleeding.  It is normal to have a small amount of spotting if your cervix was checked.   You don't feel your baby moving like normal.  If you don't, get you something to eat and drink and lay down and focus on feeling your baby move.  You should feel at least 10 movements in 2 hours.  If you don't, you should call the office or go to [facility].    Third Trimester of Pregnancy The third trimester is from week 29 through week 42, months 7 through 9. The third trimester is a time when the fetus is growing rapidly. At the end of the ninth month, the fetus is about 20 inches in length and weighs 6-10 pounds.  BODY CHANGES Your body goes through many changes during pregnancy. The changes vary from woman to woman.   Your weight will continue to increase. You can expect to gain 25-35 pounds (11-16 kg) by the end of the pregnancy.  You may begin to get stretch marks on your hips, abdomen, and breasts.  You may urinate more often because the fetus is moving lower into your pelvis and pressing on your bladder.  You may develop or continue to have heartburn as a result of your pregnancy.  You may develop constipation because certain hormones are causing the muscles that push waste through your intestines to slow down.  You may develop hemorrhoids or swollen, bulging veins (varicose veins).  You may have pelvic pain because of the weight gain and pregnancy hormones relaxing your joints between the bones in your pelvis. Backaches may result from overexertion of the muscles supporting your posture.  You may have changes in your hair. These can include thickening of your hair, rapid growth, and changes in texture. Some women  also have hair loss during or after pregnancy, or hair that feels dry or thin. Your hair will most likely return to normal after your baby is born.  Your breasts will continue to grow and be tender. A yellow discharge may leak from your breasts called colostrum.  Your belly button may stick out.  You may feel short of breath because of your expanding uterus.  You may notice the fetus "dropping," or moving lower in your abdomen.  You may have a bloody mucus discharge. This usually occurs a few days to a week before labor begins.  Your cervix becomes thin and soft (effaced) near your due date. WHAT TO EXPECT AT YOUR PRENATAL EXAMS  You will have prenatal exams every 2 weeks until week 36. Then, you will have weekly prenatal exams. During a routine prenatal visit:  You will be weighed to make sure you and the fetus are growing normally.  Your blood pressure is taken.  Your abdomen will be measured to track your baby's growth.  The fetal heartbeat will be listened to.  Any test results from the previous visit will be discussed.  You may have a cervical check near your due date to see if you have effaced. At around 36 weeks, your caregiver will check your cervix. At the same time, your caregiver will also perform a test on the secretions of the vaginal tissue. This test is to   determine if a type of bacteria, Group B streptococcus, is present. Your caregiver will explain this further. Your caregiver may ask you:  What your birth plan is.  How you are feeling.  If you are feeling the baby move.  If you have had any abnormal symptoms, such as leaking fluid, bleeding, severe headaches, or abdominal cramping.  If you have any questions. Other tests or screenings that may be performed during your third trimester include:  Blood tests that check for low iron levels (anemia).  Fetal testing to check the health, activity level, and growth of the fetus. Testing is done if you have certain  medical conditions or if there are problems during the pregnancy. FALSE LABOR You may feel small, irregular contractions that eventually go away. These are called Braxton Hicks contractions, or false labor. Contractions may last for hours, days, or even weeks before true labor sets in. If contractions come at regular intervals, intensify, or become painful, it is best to be seen by your caregiver.  SIGNS OF LABOR   Menstrual-like cramps.  Contractions that are 5 minutes apart or less.  Contractions that start on the top of the uterus and spread down to the lower abdomen and back.  A sense of increased pelvic pressure or back pain.  A watery or bloody mucus discharge that comes from the vagina. If you have any of these signs before the 37th week of pregnancy, call your caregiver right away. You need to go to the hospital to get checked immediately. HOME CARE INSTRUCTIONS   Avoid all smoking, herbs, alcohol, and unprescribed drugs. These chemicals affect the formation and growth of the baby.  Follow your caregiver's instructions regarding medicine use. There are medicines that are either safe or unsafe to take during pregnancy.  Exercise only as directed by your caregiver. Experiencing uterine cramps is a good sign to stop exercising.  Continue to eat regular, healthy meals.  Wear a good support bra for breast tenderness.  Do not use hot tubs, steam rooms, or saunas.  Wear your seat belt at all times when driving.  Avoid raw meat, uncooked cheese, cat litter boxes, and soil used by cats. These carry germs that can cause birth defects in the baby.  Take your prenatal vitamins.  Try taking a stool softener (if your caregiver approves) if you develop constipation. Eat more high-fiber foods, such as fresh vegetables or fruit and whole grains. Drink plenty of fluids to keep your urine clear or pale yellow.  Take warm sitz baths to soothe any pain or discomfort caused by hemorrhoids. Use  hemorrhoid cream if your caregiver approves.  If you develop varicose veins, wear support hose. Elevate your feet for 15 minutes, 3-4 times a day. Limit salt in your diet.  Avoid heavy lifting, wear low heal shoes, and practice good posture.  Rest a lot with your legs elevated if you have leg cramps or low back pain.  Visit your dentist if you have not gone during your pregnancy. Use a soft toothbrush to brush your teeth and be gentle when you floss.  A sexual relationship may be continued unless your caregiver directs you otherwise.  Do not travel far distances unless it is absolutely necessary and only with the approval of your caregiver.  Take prenatal classes to understand, practice, and ask questions about the labor and delivery.  Make a trial run to the hospital.  Pack your hospital bag.  Prepare the baby's nursery.  Continue to go to all   your prenatal visits as directed by your caregiver. SEEK MEDICAL CARE IF:  You are unsure if you are in labor or if your water has broken.  You have dizziness.  You have mild pelvic cramps, pelvic pressure, or nagging pain in your abdominal area.  You have persistent nausea, vomiting, or diarrhea.  You have a bad smelling vaginal discharge.  You have pain with urination. SEEK IMMEDIATE MEDICAL CARE IF:   You have a fever.  You are leaking fluid from your vagina.  You have spotting or bleeding from your vagina.  You have severe abdominal cramping or pain.  You have rapid weight loss or gain.  You have shortness of breath with chest pain.  You notice sudden or extreme swelling of your face, hands, ankles, feet, or legs.  You have not felt your baby move in over an hour.  You have severe headaches that do not go away with medicine.  You have vision changes. Document Released: 11/12/2001 Document Revised: 11/23/2013 Document Reviewed: 01/19/2013 [employer] Patient Information 2015 [employer]. This information is not  intended to replace advice given to you by your health care Chayah Mckee. Make sure you discuss any questions you have with your health care Hena Ewalt.  

## 2014-05-26 ENCOUNTER — Ambulatory Visit (INDEPENDENT_AMBULATORY_CARE_PROVIDER_SITE_OTHER): Payer: 59

## 2014-05-26 DIAGNOSIS — Z3483 Encounter for supervision of other normal pregnancy, third trimester: Secondary | ICD-10-CM

## 2014-05-26 DIAGNOSIS — O26849 Uterine size-date discrepancy, unspecified trimester: Secondary | ICD-10-CM

## 2014-05-26 DIAGNOSIS — O26843 Uterine size-date discrepancy, third trimester: Secondary | ICD-10-CM

## 2014-05-26 NOTE — Progress Notes (Signed)
U/S(34+4wks)-vtx active fetus, approp growth EFW 5 lb 11 oz (54th%tile), fluid WNL AFI-9.8cm SDP-4.7cm, posterior/fundal Gr 2 placenta, FHR_143 bpm, female fetus "Donna Kim"

## 2014-05-27 ENCOUNTER — Other Ambulatory Visit: Payer: Self-pay | Admitting: *Deleted

## 2014-05-27 NOTE — Telephone Encounter (Signed)
Left VM stating the letter is ready to be picked up out front.

## 2014-05-30 ENCOUNTER — Other Ambulatory Visit: Payer: 59

## 2014-06-06 ENCOUNTER — Telehealth: Payer: Self-pay | Admitting: Obstetrics and Gynecology

## 2014-06-06 NOTE — Telephone Encounter (Signed)
Pt states has a lot of swelling this weekend, checked blood pressure on Saturday night and it was normal, was on her feet quite a bit over the weekend and feels like whole body is swollen.  Pt reports baby is moving but not quite a much as had been.  Pt has an appointment tomorrow afternoon for routine follow up.  Advised pt to push fluids, rest, elevate feet as much as possible and to eat or drink something sweet and see if baby becomes more active, if anything gets worse or doesn't feel the baby call us back otherwise keep appointment for tomorrow.  Reports no bleeding, no loss of fluids and no pain.

## 2014-06-07 ENCOUNTER — Ambulatory Visit (INDEPENDENT_AMBULATORY_CARE_PROVIDER_SITE_OTHER): Payer: 59 | Admitting: Obstetrics & Gynecology

## 2014-06-07 ENCOUNTER — Encounter: Payer: Self-pay | Admitting: Obstetrics & Gynecology

## 2014-06-07 VITALS — BP 110/70 | Wt 198.0 lb

## 2014-06-07 DIAGNOSIS — Z1389 Encounter for screening for other disorder: Secondary | ICD-10-CM

## 2014-06-07 DIAGNOSIS — Z3483 Encounter for supervision of other normal pregnancy, third trimester: Secondary | ICD-10-CM

## 2014-06-07 DIAGNOSIS — Z331 Pregnant state, incidental: Secondary | ICD-10-CM

## 2014-06-07 DIAGNOSIS — Z3480 Encounter for supervision of other normal pregnancy, unspecified trimester: Secondary | ICD-10-CM

## 2014-06-07 LAB — POCT URINALYSIS DIPSTICK
Blood, UA: NEGATIVE
Glucose, UA: NEGATIVE
Ketones, UA: NEGATIVE
Leukocytes, UA: NEGATIVE
Nitrite, UA: NEGATIVE
Protein, UA: NEGATIVE

## 2014-06-07 NOTE — Progress Notes (Signed)
Sonogram is reviewed from last visit and normal growth  G2P1001 [redacted]w[redacted]d Estimated Date of Delivery: 07/03/14  Blood pressure 110/70, weight 198 lb (89.812 kg), last menstrual period 09/26/2013, unknown if currently breastfeeding.   BP weight and urine results all reviewed and noted.  Please refer to the obstetrical flow sheet for the fundal height and fetal heart rate documentation:  Patient reports good fetal movement, denies any bleeding and no rupture of membranes symptoms or regular contractions. Patient is without complaints. All questions were answered.  Plan:  Continued routine obstetrical care,   Follow up in 1 weeks for OB appointment, routine

## 2014-06-14 ENCOUNTER — Ambulatory Visit (INDEPENDENT_AMBULATORY_CARE_PROVIDER_SITE_OTHER): Payer: 59 | Admitting: Obstetrics & Gynecology

## 2014-06-14 ENCOUNTER — Encounter: Payer: Self-pay | Admitting: Obstetrics & Gynecology

## 2014-06-14 VITALS — BP 110/80 | Wt 199.0 lb

## 2014-06-14 DIAGNOSIS — Z331 Pregnant state, incidental: Secondary | ICD-10-CM

## 2014-06-14 DIAGNOSIS — Z3480 Encounter for supervision of other normal pregnancy, unspecified trimester: Secondary | ICD-10-CM

## 2014-06-14 DIAGNOSIS — Z3483 Encounter for supervision of other normal pregnancy, third trimester: Secondary | ICD-10-CM

## 2014-06-14 DIAGNOSIS — Z1389 Encounter for screening for other disorder: Secondary | ICD-10-CM

## 2014-06-14 LAB — POCT URINALYSIS DIPSTICK
Blood, UA: NEGATIVE
Glucose, UA: NEGATIVE
Leukocytes, UA: NEGATIVE
Nitrite, UA: NEGATIVE
PROTEIN UA: NEGATIVE

## 2014-06-14 NOTE — Addendum Note (Signed)
Addended by: Doyne Keel on: 06/14/2014 04:06 PM   Modules accepted: Orders

## 2014-06-14 NOTE — Progress Notes (Signed)
Soft good cervical development  G2P1001 [redacted]w[redacted]d Estimated Date of Delivery: 07/03/14  Blood pressure 110/80, weight 199 lb (90.266 kg), last menstrual period 09/26/2013, unknown if currently breastfeeding.   BP weight and urine results all reviewed and noted.  Please refer to the obstetrical flow sheet for the fundal height and fetal heart rate documentation:  Patient reports good fetal movement, denies any bleeding and no rupture of membranes symptoms or regular contractions. Patient is without complaints. All questions were answered.  Plan:  Continued routine obstetrical care,   Follow up in 1 weeks for OB appointment, routine

## 2014-06-15 LAB — GC/CHLAMYDIA PROBE AMP
CT PROBE, AMP APTIMA: NEGATIVE
GC Probe RNA: NEGATIVE

## 2014-06-15 LAB — OB RESULTS CONSOLE GC/CHLAMYDIA
CHLAMYDIA, DNA PROBE: NEGATIVE
Gonorrhea: NEGATIVE

## 2014-06-16 LAB — STREP B DNA PROBE: STREP GROUP B AG: DETECTED

## 2014-06-21 ENCOUNTER — Ambulatory Visit (INDEPENDENT_AMBULATORY_CARE_PROVIDER_SITE_OTHER): Payer: 59 | Admitting: Women's Health

## 2014-06-21 ENCOUNTER — Encounter: Payer: Self-pay | Admitting: Women's Health

## 2014-06-21 VITALS — BP 122/72 | Wt 200.0 lb

## 2014-06-21 DIAGNOSIS — Z3483 Encounter for supervision of other normal pregnancy, third trimester: Secondary | ICD-10-CM

## 2014-06-21 DIAGNOSIS — Z331 Pregnant state, incidental: Secondary | ICD-10-CM

## 2014-06-21 DIAGNOSIS — Z3480 Encounter for supervision of other normal pregnancy, unspecified trimester: Secondary | ICD-10-CM

## 2014-06-21 DIAGNOSIS — Z1389 Encounter for screening for other disorder: Secondary | ICD-10-CM

## 2014-06-21 LAB — POCT URINALYSIS DIPSTICK
Blood, UA: NEGATIVE
Glucose, UA: NEGATIVE
Ketones, UA: NEGATIVE
LEUKOCYTES UA: NEGATIVE
Nitrite, UA: NEGATIVE
PROTEIN UA: NEGATIVE

## 2014-06-21 NOTE — Progress Notes (Signed)
Low-risk OB appointment G2P1001 [redacted]w[redacted]d Estimated Date of Delivery: 07/03/14 BP 122/72  Wt 200 lb (90.719 kg)  LMP 09/26/2013  BP, weight, and urine reviewed.  Refer to obstetrical flow sheet for FH & FHR.  Reports good fm.  Denies regular uc's, lof, vb, or uti s/s. No complaints. Reviewed labor s/s, fkc, gbs+. Plan:  Continue routine obstetrical care  F/U in 1wk for OB appointment

## 2014-06-21 NOTE — Patient Instructions (Signed)
Call the office ([phone]) or go to [facility] if:  You begin to have strong, frequent contractions  Your water breaks.  Sometimes it is a big gush of fluid, sometimes it is just a trickle that keeps getting your panties wet or running down your legs  You have vaginal bleeding.  It is normal to have a small amount of spotting if your cervix was checked.   You don't feel your baby moving like normal.  If you don't, get you something to eat and drink and lay down and focus on feeling your baby move.  You should feel at least 10 movements in 2 hours.  If you don't, you should call the office or go to [facility].    Braxton Hicks Contractions Contractions of the uterus can occur throughout pregnancy. Contractions are not always a sign that you are in labor.  WHAT ARE BRAXTON HICKS CONTRACTIONS?  Contractions that occur before labor are called Braxton Hicks contractions, or false labor. Toward the end of pregnancy (32-34 weeks), these contractions can develop more often and may become more forceful. This is not true labor because these contractions do not result in opening (dilatation) and thinning of the cervix. They are sometimes difficult to tell apart from true labor because these contractions can be forceful and people have different pain tolerances. You should not feel embarrassed if you go to the hospital with false labor. Sometimes, the only way to tell if you are in true labor is for your health care provider to look for changes in the cervix. If there are no prenatal problems or other health problems associated with the pregnancy, it is completely safe to be sent home with false labor and await the onset of true labor. HOW CAN YOU TELL THE DIFFERENCE BETWEEN TRUE AND FALSE LABOR? False Labor  The contractions of false labor are usually shorter and not as hard as those of true labor.   The contractions are usually irregular.   The contractions are often felt in the front of  the lower abdomen and in the groin.   The contractions may go away when you walk around or change positions while lying down.   The contractions get weaker and are shorter lasting as time goes on.   The contractions do not usually become progressively stronger, regular, and closer together as with true labor.  True Labor  Contractions in true labor last 30-70 seconds, become very regular, usually become more intense, and increase in frequency.   The contractions do not go away with walking.   The discomfort is usually felt in the top of the uterus and spreads to the lower abdomen and low back.   True labor can be determined by your health care provider with an exam. This will show that the cervix is dilating and getting thinner.  WHAT TO REMEMBER  Keep up with your usual exercises and follow other instructions given by your health care provider.   Take medicines as directed by your health care provider.   Keep your regular prenatal appointments.   Eat and drink lightly if you think you are going into labor.   If Braxton Hicks contractions are making you uncomfortable:   Change your position from lying down or resting to walking, or from walking to resting.   Sit and rest in a tub of warm water.   Drink 2-3 glasses of water. Dehydration may cause these contractions.   Do slow and deep breathing several times an hour.    WHEN SHOULD I SEEK IMMEDIATE MEDICAL CARE? Seek immediate medical care if:  Your contractions become stronger, more regular, and closer together.   You have fluid leaking or gushing from your vagina.   You have a fever.   You pass blood-tinged mucus.   You have vaginal bleeding.   You have continuous abdominal pain.   You have low back pain that you never had before.   You feel your baby's head pushing down and causing pelvic pressure.   Your baby is not moving as much as it used to.  Document Released: 11/18/2005 Document  Revised: 11/23/2013 Document Reviewed: 08/30/2013 [facility] Patient Information 2015 [place], [place]. This information is not intended to replace advice given to you by your health care provider. Make sure you discuss any questions you have with your health care provider.  Group B Streptococcus Infection During Pregnancy Group B streptococcus (GBS) is a type of bacteria often found in healthy women. GBS is not the same as the bacteria that causes strep throat. You may have GBS in your vagina, rectum, or bladder. GBS does not spread through sexual contact, but it can be passed to a baby during childbirth. This can be dangerous for your baby. It is not dangerous to you and usually does not cause any symptoms. Your health care provider may test you for GBS when your pregnancy is between 35 and 37 weeks. GBS is dangerous only during birth, so there is no need to test for it earlier. It is possible to have GBS during pregnancy and never pass it to your baby. If your test results are positive for GBS, your health care provider may recommend giving you antibiotic medicine during delivery to make sure your baby stays healthy. RISK FACTORS You are more likely to pass GBS to your baby if:   Your water breaks (ruptured membrane) or you go into labor before 37 weeks.  Your water breaks 18 hours before you deliver.  You passed GBS during a previous pregnancy.  You have a urinary tract infection caused by GBS any time during pregnancy.  You have a fever during labor. SYMPTOMS Most women who have GBS do not have any symptoms. If you have a urinary tract infection caused by GBS, you might have frequent or painful urination and fever. Babies who get GBS usually show symptoms within 7 days of birth. Symptoms may include:   Breathing problems.  Heart and blood pressure problems.  Digestive and kidney problems. DIAGNOSIS Routine screening for GBS is recommended for all pregnant women. A health care provider  takes a sample of the fluid in your vagina and rectum with a swab. It is then sent to a lab to be checked for GBS. A sample of your urine may also be checked for the bacteria.  TREATMENT If you test positive for GBS, you may need treatment with an antibiotic medicine during labor. As soon as you go into labor, or as soon as your membranes rupture, you will get the antibiotic medicine through an IV access. You will continue to get the medicine until after you give birth. You do not need antibiotic medicine if you are having a cesarean delivery.If your baby shows signs or symptoms of GBS after birth, your baby can also be treated with an antibiotic medicine. HOME CARE INSTRUCTIONS   Take all antibiotic medicine as prescribed by your health care provider. Only take medicine as directed.   Continue with prenatal visits and care.   Keep all follow-up appointments.  SEEK MEDICAL CARE IF:   You have pain when you urinate.   You have to urinate frequently.   You have a fever.  SEEK IMMEDIATE MEDICAL CARE IF:   Your membranes rupture.  You go into labor. Document Released: 02/25/2008 Document Revised: 11/23/2013 Document Reviewed: 09/10/2013 [facility] Patient Information 2015 [place], [place]. This information is not intended to replace advice given to you by your health care provider. Make sure you discuss any questions you have with your health care provider.

## 2014-06-24 ENCOUNTER — Telehealth: Payer: Self-pay | Admitting: Obstetrics and Gynecology

## 2014-06-27 ENCOUNTER — Encounter: Payer: 59 | Admitting: Obstetrics & Gynecology

## 2014-06-28 ENCOUNTER — Ambulatory Visit (INDEPENDENT_AMBULATORY_CARE_PROVIDER_SITE_OTHER): Payer: 59 | Admitting: Obstetrics & Gynecology

## 2014-06-28 VITALS — BP 114/70 | Wt 200.0 lb

## 2014-06-28 DIAGNOSIS — Z331 Pregnant state, incidental: Secondary | ICD-10-CM

## 2014-06-28 DIAGNOSIS — Z1389 Encounter for screening for other disorder: Secondary | ICD-10-CM

## 2014-06-28 DIAGNOSIS — Z3480 Encounter for supervision of other normal pregnancy, unspecified trimester: Secondary | ICD-10-CM

## 2014-06-28 DIAGNOSIS — Z3483 Encounter for supervision of other normal pregnancy, third trimester: Secondary | ICD-10-CM

## 2014-06-28 LAB — POCT URINALYSIS DIPSTICK
Blood, UA: NEGATIVE
Glucose, UA: NEGATIVE
KETONES UA: NEGATIVE
Leukocytes, UA: NEGATIVE
NITRITE UA: NEGATIVE

## 2014-06-28 NOTE — Progress Notes (Signed)
G2P1001 [redacted]w[redacted]d Estimated Date of Delivery: 07/03/14  Blood pressure 114/70, weight 200 lb (90.719 kg), last menstrual period 09/26/2013, unknown if currently breastfeeding.   BP weight and urine results all reviewed and noted.  Please refer to the obstetrical flow sheet for the fundal height and fetal heart rate documentation:  Patient reports good fetal movement, denies any bleeding and no rupture of membranes symptoms or regular contractions. Patient is without complaints. All questions were answered.  Plan:  Continued routine obstetrical care,   Follow up in 1 weeks for OB appointment, routine

## 2014-06-29 ENCOUNTER — Telehealth: Payer: Self-pay | Admitting: Women's Health

## 2014-07-04 ENCOUNTER — Inpatient Hospital Stay (HOSPITAL_COMMUNITY)
Admission: AD | Admit: 2014-07-04 | Discharge: 2014-07-06 | DRG: 775 | Disposition: A | Discharge status: Home / Self Care | Payer: 59 | Source: Ambulatory Visit | Attending: Obstetrics & Gynecology | Admitting: Obstetrics & Gynecology

## 2014-07-04 ENCOUNTER — Encounter (HOSPITAL_COMMUNITY): Payer: Self-pay | Admitting: *Deleted

## 2014-07-04 ENCOUNTER — Ambulatory Visit (INDEPENDENT_AMBULATORY_CARE_PROVIDER_SITE_OTHER): Payer: 59 | Admitting: Obstetrics & Gynecology

## 2014-07-04 VITALS — BP 130/84 | Wt 204.0 lb

## 2014-07-04 DIAGNOSIS — L719 Rosacea, unspecified: Secondary | ICD-10-CM | POA: Diagnosis present

## 2014-07-04 DIAGNOSIS — Z3483 Encounter for supervision of other normal pregnancy, third trimester: Secondary | ICD-10-CM

## 2014-07-04 DIAGNOSIS — Z1389 Encounter for screening for other disorder: Secondary | ICD-10-CM

## 2014-07-04 DIAGNOSIS — IMO0001 Reserved for inherently not codable concepts without codable children: Secondary | ICD-10-CM

## 2014-07-04 DIAGNOSIS — O99892 Other specified diseases and conditions complicating childbirth: Secondary | ICD-10-CM | POA: Diagnosis present

## 2014-07-04 DIAGNOSIS — O480 Post-term pregnancy: Secondary | ICD-10-CM

## 2014-07-04 DIAGNOSIS — O479 False labor, unspecified: Secondary | ICD-10-CM | POA: Diagnosis present

## 2014-07-04 DIAGNOSIS — Z22330 Carrier of Group B streptococcus: Secondary | ICD-10-CM | POA: Diagnosis not present

## 2014-07-04 DIAGNOSIS — Z3480 Encounter for supervision of other normal pregnancy, unspecified trimester: Secondary | ICD-10-CM

## 2014-07-04 DIAGNOSIS — O9989 Other specified diseases and conditions complicating pregnancy, childbirth and the puerperium: Secondary | ICD-10-CM

## 2014-07-04 DIAGNOSIS — Z331 Pregnant state, incidental: Secondary | ICD-10-CM

## 2014-07-04 LAB — POCT URINALYSIS DIPSTICK
GLUCOSE UA: NEGATIVE
KETONES UA: NEGATIVE
Leukocytes, UA: NEGATIVE
Nitrite, UA: NEGATIVE
RBC UA: NEGATIVE

## 2014-07-04 LAB — CBC
HCT: 34.1 % — ABNORMAL LOW (ref 36.0–46.0)
Hemoglobin: 11.8 g/dL — ABNORMAL LOW (ref 12.0–15.0)
MCH: 29.8 pg (ref 26.0–34.0)
MCHC: 34.6 g/dL (ref 30.0–36.0)
MCV: 86.1 fL (ref 78.0–100.0)
PLATELETS: 132 10*3/uL — AB (ref 150–400)
RBC: 3.96 MIL/uL (ref 3.87–5.11)
RDW: 14.7 % (ref 11.5–15.5)
WBC: 14.7 10*3/uL — AB (ref 4.0–10.5)

## 2014-07-04 MED ORDER — IBUPROFEN 600 MG PO TABS
600.0000 mg | ORAL_TABLET | Freq: Four times a day (QID) | ORAL | Status: DC | PRN
  Administered 2014-07-04: 600 mg via ORAL
  Filled 2014-07-04: qty 1

## 2014-07-04 MED ORDER — WITCH HAZEL-GLYCERIN EX PADS
1.0000 | MEDICATED_PAD | CUTANEOUS | Status: DC | PRN
Start: 2014-07-04 — End: 2014-07-06

## 2014-07-04 MED ORDER — PRENATAL MULTIVITAMIN CH
1.0000 | ORAL_TABLET | Freq: Every day | ORAL | Status: DC
  Administered 2014-07-05: 1 via ORAL
  Filled 2014-07-04: qty 1

## 2014-07-04 MED ORDER — BENZOCAINE-MENTHOL 20-0.5 % EX AERO
1.0000 "application " | INHALATION_SPRAY | CUTANEOUS | Status: DC | PRN
  Administered 2014-07-05: 1 via TOPICAL
  Filled 2014-07-04: qty 56

## 2014-07-04 MED ORDER — OXYTOCIN BOLUS FROM INFUSION: 500.0000 mL | INTRAVENOUS | Status: DC

## 2014-07-04 MED ORDER — ONDANSETRON HCL 4 MG PO TABS: 4.0000 mg | ORAL_TABLET | ORAL | Status: DC | PRN

## 2014-07-04 MED ORDER — PRENATAL MULTIVITAMIN CH: 1.0000 | ORAL_TABLET | Freq: Every day | ORAL | Status: DC

## 2014-07-04 MED ORDER — TETANUS-DIPHTH-ACELL PERTUSSIS 5-2.5-18.5 LF-MCG/0.5 IM SUSP
0.5000 mL | Freq: Once | INTRAMUSCULAR | Status: AC
  Administered 2014-07-06: 0.5 mL via INTRAMUSCULAR

## 2014-07-04 MED ORDER — ACETAMINOPHEN 325 MG PO TABS: 650.0000 mg | ORAL_TABLET | ORAL | Status: DC | PRN

## 2014-07-04 MED ORDER — LANOLIN HYDROUS EX OINT: TOPICAL_OINTMENT | CUTANEOUS | Status: DC | PRN

## 2014-07-04 MED ORDER — SODIUM CHLORIDE 0.9 % IV SOLN
2.0000 g | Freq: Once | INTRAVENOUS | Status: AC
  Administered 2014-07-04: 2 g via INTRAVENOUS
  Filled 2014-07-04: qty 2000

## 2014-07-04 MED ORDER — LIDOCAINE HCL (PF) 1 % IJ SOLN
30.0000 mL | INTRAMUSCULAR | Status: DC | PRN
  Administered 2014-07-04: 30 mL via SUBCUTANEOUS
  Filled 2014-07-04: qty 30

## 2014-07-04 MED ORDER — OXYCODONE-ACETAMINOPHEN 5-325 MG PO TABS
1.0000 | ORAL_TABLET | ORAL | Status: DC | PRN
  Administered 2014-07-05 (×3): 1 via ORAL
  Filled 2014-07-04 (×3): qty 1

## 2014-07-04 MED ORDER — IBUPROFEN 600 MG PO TABS
600.0000 mg | ORAL_TABLET | Freq: Four times a day (QID) | ORAL | Status: DC
  Administered 2014-07-04 – 2014-07-06 (×6): 600 mg via ORAL
  Filled 2014-07-04 (×6): qty 1

## 2014-07-04 MED ORDER — DIPHENHYDRAMINE HCL 25 MG PO CAPS
25.0000 mg | ORAL_CAPSULE | Freq: Four times a day (QID) | ORAL | Status: DC | PRN
Start: 2014-07-04 — End: 2014-07-06

## 2014-07-04 MED ORDER — DIBUCAINE 1 % RE OINT: 1.0000 "application " | TOPICAL_OINTMENT | RECTAL | Status: DC | PRN

## 2014-07-04 MED ORDER — OXYCODONE-ACETAMINOPHEN 5-325 MG PO TABS
1.0000 | ORAL_TABLET | ORAL | Status: DC | PRN
  Administered 2014-07-04: 1 via ORAL
  Filled 2014-07-04: qty 1

## 2014-07-04 MED ORDER — LACTATED RINGERS IV SOLN
INTRAVENOUS | Status: DC
  Administered 2014-07-04: 16:00:00 via INTRAVENOUS

## 2014-07-04 MED ORDER — OXYTOCIN 40 UNITS IN LACTATED RINGERS INFUSION - SIMPLE MED
62.5000 mL/h | INTRAVENOUS | Status: DC
  Filled 2014-07-04: qty 1000

## 2014-07-04 MED ORDER — SIMETHICONE 80 MG PO CHEW: 80.0000 mg | CHEWABLE_TABLET | ORAL | Status: DC | PRN

## 2014-07-04 MED ORDER — ONDANSETRON HCL 4 MG/2ML IJ SOLN: 4.0000 mg | INTRAMUSCULAR | Status: DC | PRN

## 2014-07-04 MED ORDER — ONDANSETRON HCL 4 MG/2ML IJ SOLN: 4.0000 mg | Freq: Four times a day (QID) | INTRAMUSCULAR | Status: DC | PRN

## 2014-07-04 MED ORDER — CITRIC ACID-SODIUM CITRATE 334-500 MG/5ML PO SOLN: 30.0000 mL | ORAL | Status: DC | PRN

## 2014-07-04 MED ORDER — LACTATED RINGERS IV SOLN: 500.0000 mL | INTRAVENOUS | Status: DC | PRN

## 2014-07-04 MED ORDER — SENNOSIDES-DOCUSATE SODIUM 8.6-50 MG PO TABS
2.0000 | ORAL_TABLET | ORAL | Status: DC
  Administered 2014-07-04 – 2014-07-06 (×2): 2 via ORAL
  Filled 2014-07-04 (×2): qty 2

## 2014-07-04 MED ORDER — ZOLPIDEM TARTRATE 5 MG PO TABS: 5.0000 mg | ORAL_TABLET | Freq: Every evening | ORAL | Status: DC | PRN

## 2014-07-04 NOTE — H&P (Signed)
Attestation of Attending Supervision of Fellow: Evaluation and management procedures were performed by the Fellow under my supervision and collaboration.  I have reviewed the Fellow's note and chart, and I agree with the management and plan.    

## 2014-07-04 NOTE — H&P (Signed)
LABOR ADMISSION HISTORY AND PHYSICAL  Donna Kim is a 33 y.o. female G2P1001 with IUP at [redacted]w[redacted]d by LMP and early [place] presenting for active labor. Pt presents w/ contractions q 5 min. She reports +FMs, No LOF, no VB, no blurry vision, headaches or peripheral edema, and RUQ pain. She desires an epidural for labor pain control. She plans on breast feeding. She request condoms for birth control. Epidural desired  Dating: By LMP --->  Estimated Date of Delivery: 07/03/14  Sono:    26 wks --> suboptimal cardiac OFTs, rechecked and WNL  Prenatal History/Complications: Hyperemesis Roscea  Past Medical History: Past Medical History  Diagnosis Date  . No pertinent past medical history   . Back pain 11/12/2013    Past Surgical History: Past Surgical History  Procedure Laterality Date  . Wisdom tooth extraction      Obstetrical History: OB History   Grav Para Term Preterm Abortions TAB SAB Ect Mult Living   2 1 1       1        Gynecological History: OB History   Grav Para Term Preterm Abortions TAB SAB Ect Mult Living   2 2 2       2       Social History: History   Social History  . Marital Status: Married    Spouse Name: N/A    Number of Children: N/A  . Years of Education: N/A   Social History Main Topics  . Smoking status: Never Smoker   . Smokeless tobacco: Never Used  . Alcohol Use: No  . Drug Use: No  . Sexual Activity: Yes    Birth Control/ Protection: None   Other Topics Concern  . Not on file   Social History Narrative  . No narrative on file    Family History: Family History  Problem Relation Age of Onset  . Adopted: Yes    Allergies: No Known Allergies  Prescriptions prior to admission  Medication Sig Dispense Refill  . acetaminophen (TYLENOL) 325 MG tablet Take 650 mg by mouth every 6 (six) hours as needed. Patient took this medication for pain.       . metronidazole (NORITATE) 1 % cream Apply topically daily. Generic rx acceptable  60 g  0   . Prenatal Vit-Fe Sulfate-FA (PRENATAL VITAMIN PO) Take by mouth.      . promethazine (PHENERGAN) 25 MG tablet Take 25 mg by mouth every 6 (six) hours as needed for nausea or vomiting.         Review of Systems   All systems reviewed and negative except as stated in HPI  Last menstrual period 09/26/2013, unknown if currently breastfeeding. General appearance: alert, cooperative and appears stated age Lungs: clear to auscultation bilaterally Heart: regular rate and rhythm Abdomen: soft, non-tender; bowel sounds normal Pelvic: Adequate, 8/90/-1 Extremities: Homans sign is negative, no sign of DVT Presentation: cephalic Fetal monitoringBaseline: 150 bpm Uterine activityFrequency: Every 5 minutes, Duration: 60 seconds and Intensity: strong Dilation: 8 Effacement (%): 90 Station: -1 Exam by:: Ignacia Bayley, RN BSN   Prenatal labs: ABO, Rh: O/POS/-- (12/15 1700) Antibody: NEG (04/28 0925) Rubella:   RPR: NON REAC (04/28 0925)  HBsAg: NEGATIVE (12/15 1700)  HIV: NONREACTIVE (04/28 0925)  GBS: Detected (07/14 1610)  1 hr Glucola 76 Genetic screening  Declined Anatomy US WNL   Prenatal Transfer Tool  Maternal Diabetes: No Genetic Screening: Declined Maternal Ultrasounds/Referrals: Normal Fetal Ultrasounds or other Referrals:  None Maternal Substance Abuse:  No  Significant Maternal Medications:  None Significant Maternal Lab Results: Lab values include: Group B Strep positive     Results for orders placed in visit on 07/04/14 (from the past 24 hour(s))  POCT URINALYSIS DIPSTICK   Collection Time    07/04/14 10:11 AM      Result Value Ref Range   Color, UA yellow     Clarity, UA clear     Glucose, UA neg     Bilirubin, UA       Ketones, UA neg     Spec Grav, UA       Blood, UA neg     pH, UA       Protein, UA trace     Urobilinogen, UA       Nitrite, UA neg     Leukocytes, UA Negative      Patient Active Problem List   Diagnosis Date Noted  . Hyperemesis  gravidarum 03/18/2014  . Rosacea, acne 03/18/2014  . Supervision of other normal pregnancy 11/15/2013  . Back pain 11/12/2013  . Acute upper respiratory infection 03/17/2013    Assessment: Donna Kim is a 33 y.o. G2P1001 at [redacted]w[redacted]d here for active term labor   33 y.o.  G2P1001 at [redacted]w[redacted]d by LMP  in active labor with Cat 1 strip.  #Labor: Expectant Management #Pain: Desires epidural #FWB: Cat I, reassuring #ID:  GBS +, will dose ampicillin #MOF: Breast #MOC: Condoms #Circ:  N/A  Tula Nakayama 07/04/2014, 4:05 PM

## 2014-07-04 NOTE — MAU Note (Signed)
Patient states she is having frequent contractions. Was 4 cm in the office this am and had membrane stripped.

## 2014-07-04 NOTE — Progress Notes (Signed)
G2P1001 [redacted]w[redacted]d Estimated Date of Delivery: 07/03/14  Blood pressure 130/84, weight 204 lb (92.534 kg), last menstrual period 09/26/2013, unknown if currently breastfeeding.   BP weight and urine results all reviewed and noted.  Please refer to the obstetrical flow sheet for the fundal height and fetal heart rate documentation:  Patient reports good fetal movement, denies any bleeding and no rupture of membranes symptoms or regular contractions. Patient is without complaints. All questions were answered.  Plan:  Continued routine obstetrical care,   Follow up in 6 weeks for OB appointment, pp appt

## 2014-07-05 LAB — CBC
HEMATOCRIT: 29.3 % — AB (ref 36.0–46.0)
HEMOGLOBIN: 9.7 g/dL — AB (ref 12.0–15.0)
MCH: 29 pg (ref 26.0–34.0)
MCHC: 33.1 g/dL (ref 30.0–36.0)
MCV: 87.7 fL (ref 78.0–100.0)
Platelets: 121 10*3/uL — ABNORMAL LOW (ref 150–400)
RBC: 3.34 MIL/uL — ABNORMAL LOW (ref 3.87–5.11)
RDW: 14.7 % (ref 11.5–15.5)
WBC: 13 10*3/uL — ABNORMAL HIGH (ref 4.0–10.5)

## 2014-07-05 LAB — RPR

## 2014-07-05 NOTE — Progress Notes (Signed)
Post Partum Day 1 Subjective: no complaints, up ad lib, voiding, tolerating PO, + flatus and pain controlled, bleeding tapered, no complaints, discussed LARC this am. Will decide prior to discharge.   Objective: Blood pressure 110/58, pulse 64, temperature 98.3 F (36.8 C), temperature source Oral, resp. rate 16, height 5\' 5"  (1.651 m), weight 92.534 kg (204 lb), last menstrual period 09/26/2013, unknown if currently breastfeeding.  Physical Exam:  General: alert, cooperative and no distress Lochia: appropriate Uterine Fundus: firm Incision: N/A DVT Evaluation: No evidence of DVT seen on physical exam. No cords or calf tenderness.   Recent Labs  07/04/14 1622 07/05/14 0530  HGB 11.8* 9.7*  HCT 34.1* 29.3*    Assessment/Plan: Plan for discharge tomorrow, Breastfeeding, Lactation consult and Contraception Would like LARC. Will decide which one prior to Discharge.    LOS: 1 day   [place] 07/05/2014, 8:43 AM   I have discussed this patient and agree with above documentation in the resident's note, I came by room to see patient but she was not available.  Will attempt to see pt again.  Nila Nephew, MD OB Fellow 07/06/2014 12:14 AM

## 2014-07-06 MED ORDER — IBUPROFEN 600 MG PO TABS: 600.0000 mg | ORAL_TABLET | Freq: Four times a day (QID) | ORAL | Status: DC

## 2014-07-06 MED ORDER — DOCUSATE SODIUM 100 MG PO CAPS: 100.0000 mg | ORAL_CAPSULE | Freq: Two times a day (BID) | ORAL | Status: DC

## 2014-07-06 MED ORDER — POLYETHYLENE GLYCOL 3350 17 G PO PACK
17.0000 g | PACK | Freq: Every day | ORAL | Status: DC
  Administered 2014-07-06: 17 g via ORAL
  Filled 2014-07-06: qty 1

## 2014-07-06 NOTE — Lactation Note (Addendum)
This note was copied from the chart of Donna Kim. Lactation Consultation Note  Patient Name: Donna Arieon Scalzo FAOZH'Y Date: 07/06/2014 Reason for consult: Follow-up assessment  Baby 41 hours of life. Mom states baby is nursing well. Mom not seeing much colostrum and had supply issues with first baby. However, mom is hearing swallows when baby nursing. Mom states that she has a DEBP at home. Enc mom to post-pump after each feeding and give baby EBM after nursing. Mom states that she is going to speak with her HCP about using supplements to increase supply. Enc mom to massage breast and hand express prior to latching baby, and to hand express after post-pumping. Offered to assist with latching baby, mom declined. Stated that she sees baby's lips flanged outward when baby nursing. Discussed engorgement prevention/treatment. Mom aware of OP/BFSG and LC number to call for assistance as needed. Enc mom to feed with cues and continue to offer lots of STS to enc a good supply of milk.  Maternal Data    Feeding Feeding Type:  (Mom states she does not wish to latch baby at this time, but believes baby latching well. ) Length of feed: 10 min  LATCH Score/Interventions                      Lactation Tools Discussed/Used     Consult Status Consult Status: Complete    Linnea Todisco 07/06/2014, 10:08 AM

## 2014-07-06 NOTE — Discharge Instructions (Signed)
Before [facility] Ask any questions about feeding, diapering, and baby care before you leave the hospital. Ask again if you do not understand. Ask when you need to see the doctor again. There are several things you must have before your baby comes home.  Infant car seat.  Crib.  Do not let your baby sleep in a bed with you or anyone else.  If you do not have a bed for your baby, ask the doctor what you can use that will be safe for the baby to sleep in. Infant feeding supplies:  6 to 8 bottles (8 ounce size).  6 to 8 nipples.  Measuring cup.  Measuring tablespoon.  Bottle brush.  Sterilizer (or use any large pan or kettle with a lid).  Formula that contains iron.  A way to boil and cool water. Breastfeeding supplies:  Breast pump.  Nipple cream. Clothing:  24 to 36 cloth diapers and waterproof diaper covers or a box of disposable diapers. You may need as many as 10 to 12 diapers per day.  3 onesies (other clothing will depend on the time of year and the weather).  3 receiving blankets.  3 baby pajamas or gowns.  3 bibs. Bath equipment:  Mild soap.  Petroleum jelly. No baby oil or powder.  Soft cloth towel and washcloth.  Cotton balls.  Separate bath basin for baby. Only sponge bathe until umbilical cord and circumcision are healed. Other supplies:  Thermometer and bulb syringe (ask the hospital to send them home with you). Ask your doctor about how you should take your baby's temperature.  One to two pacifiers. Prepare for an emergency:  Know how to get to the hospital and know where to admit your baby.  Put all doctor numbers near your house phone and in your cell phone if you have one. Prepare your family:  Talk with siblings about the baby coming home and how they feel about it.  Decide how you want to handle visitors and other family members.  Take offers for help with the baby. You will need time to adjust. Know when to call the  doctor.  GET HELP RIGHT AWAY IF:  Your baby's temperature is greater than 100.47F (38C).  The soft spot on your baby's head starts to bulge.  Your baby is crying with no tears or has no wet diapers for 6 hours.  Your baby has rapid breathing.  Your baby is not as alert. Document Released: 10/31/2008 Document Revised: 04/04/2014 Document Reviewed: 02/07/2011 [facility] Patient Information 2015 [place], [place]. This information is not intended to replace advice given to you by your health care provider. Make sure you discuss any questions you have with your health care provider. Vaginal Delivery, Care After Refer to this sheet in the next few weeks. These discharge instructions provide you with information on caring for yourself after delivery. Your caregiver may also give you specific instructions. Your treatment has been planned according to the most current medical practices available, but problems sometimes occur. Call your caregiver if you have any problems or questions after you go home. HOME CARE INSTRUCTIONS  Take over-the-counter or prescription medicines only as directed by your caregiver or pharmacist.  Do not drink alcohol, especially if you are breastfeeding or taking medicine to relieve pain.  Do not chew or smoke tobacco.  Do not use illegal drugs.  Continue to use good perineal care. Good perineal care includes:  Wiping your perineum from front to back.  Keeping your  perineum clean.  Do not use tampons or douche until your caregiver says it is okay.  Shower, wash your hair, and take tub baths as directed by your caregiver.  Wear a well-fitting bra that provides breast support.  Eat healthy foods.  Drink enough fluids to keep your urine clear or pale yellow.  Eat high-fiber foods such as whole grain cereals and breads, brown rice, beans, and fresh fruits and vegetables every day. These foods may help prevent or relieve constipation.  Follow your caregiver's  recommendations regarding resumption of activities such as climbing stairs, driving, lifting, exercising, or traveling.  Talk to your caregiver about resuming sexual activities. Resumption of sexual activities is dependent upon your risk of infection, your rate of healing, and your comfort and desire to resume sexual activity.  Try to have someone help you with your household activities and your newborn for at least a few days after you leave the hospital.  Rest as much as possible. Try to rest or take a nap when your newborn is sleeping.  Increase your activities gradually.  Keep all of your scheduled postpartum appointments. It is very important to keep your scheduled follow-up appointments. At these appointments, your caregiver will be checking to make sure that you are healing physically and emotionally. SEEK MEDICAL CARE IF:   You are passing large clots from your vagina. Save any clots to show your caregiver.  You have a foul smelling discharge from your vagina.  You have trouble urinating.  You are urinating frequently.  You have pain when you urinate.  You have a change in your bowel movements.  You have increasing redness, pain, or swelling near your vaginal incision (episiotomy) or vaginal tear.  You have pus draining from your episiotomy or vaginal tear.  Your episiotomy or vaginal tear is separating.  You have painful, hard, or reddened breasts.  You have a severe headache.  You have blurred vision or see spots.  You feel sad or depressed.  You have thoughts of hurting yourself or your newborn.  You have questions about your care, the care of your newborn, or medicines.  You are dizzy or light-headed.  You have a rash.  You have nausea or vomiting.  You were breastfeeding and have not had a menstrual period within 12 weeks after you stopped breastfeeding.  You are not breastfeeding and have not had a menstrual period by the 12th week after  delivery.  You have a fever. SEEK IMMEDIATE MEDICAL CARE IF:   You have persistent pain.  You have chest pain.  You have shortness of breath.  You faint.  You have leg pain.  You have stomach pain.  Your vaginal bleeding saturates two or more sanitary pads in 1 hour. MAKE SURE YOU:   Understand these instructions.  Will watch your condition.  Will get help right away if you are not doing well or get worse. Document Released: 11/15/2000 Document Revised: 04/04/2014 Document Reviewed: 07/15/2012 [facility] Patient Information 2015 [place], [place]. This information is not intended to replace advice given to you by your health care provider. Make sure you discuss any questions you have with your health care provider.

## 2014-07-06 NOTE — Discharge Summary (Signed)
Attestation of Attending Supervision of Fellow: Evaluation and management procedures were performed by the Fellow under my supervision and collaboration.  I have reviewed the Fellow's note and chart, and I agree with the management and plan.    

## 2014-07-06 NOTE — Discharge Summary (Signed)
Obstetric Discharge Summary Reason for Admission: onset of labor Prenatal Procedures: NST Intrapartum Procedures: spontaneous vaginal delivery and GBS prophylaxis Postpartum Procedures: none Complications-Operative and Postpartum: 3rd degree perineal laceration Hemoglobin  Date Value Ref Range Status  07/05/2014 9.7* 12.0 - 15.0 g/dL Final     DELTA CHECK NOTED     REPEATED TO VERIFY     HCT  Date Value Ref Range Status  07/05/2014 29.3* 36.0 - 46.0 % Final   Delivery Note  PRE-OPERATIVE DIAGNOSIS:  1) [redacted]w[redacted]d pregnancy.  POST-OPERATIVE DIAGNOSIS:  1) [redacted]w[redacted]d pregnancy s/p Vaginal, Spontaneous Delivery  Delivery Type: Vaginal, Spontaneous Delivery  Delivery Clinician: ROBERTS, CAROLINE  Delivery Anesthesia: None  Labor Complications: Should Dystocia, resolved w/ McRoberts and suprapubic pressure  Lacerations: 2nd degree;Perineal;3rd degree  ESTIMATED BLOOD LOSS: 400 cc  Labor course: This is a 33 y.o. y.o. female G2P1001 who came in at [redacted]w[redacted]d pregnancy complaining of contractions. Her prenatal course was complicated by hyperemesis. Initial cervical exam was 8/90/-1. She was admitted to L and D. Labor course included:  AROM when c/c/0  Procedure: Vaginal, Spontaneous Delivery  Date of birth: 07/04/2014  Time of birth: 4:37 PM  This G2P1001 woman under no anesthesia delivered a viable female infant with Apgars as listed below. Delivery was via NSVD. There was a shoulder dystocia which resolved after 30 seconds with McRoberts and then suprapubic pressure.  Delivery completed and cord cut and clamped. Infant dried and stimulated. Infant to warmer. Cord pH obtained. Active management of the third stage of labor performed. Intact placenta delivered spontaneously at 8/3 4:45 PM . Vagina and rectum explored and 3rd degree laceration repaired in an normal fashion with 3.0 vicryl in 2 layers with good approximation of tissue and hemostasis. Uterus well contracted at end of delivery. Mother and infant  tolerated delivery well.  FINDINGS:  1) female infant, Apgar scores of 8 at 1 minute  9 at 5 minutes  2) 3 Vessel Cord  3) Nuchal: No  SPECIMENS: Placenta Discared; Cord gases sent  COMPLICATIONS: Shoulder Dystocia, 3rd degree laceration  DISPOSITION:  Infant to NBN   Donna Kim is a 33 y.o. female G2P1001 with IUP at [redacted]w[redacted]d by LMP and early [place] presenting for active labor. Pt presents w/ contractions q 5 min. She reports +FMs, No LOF, no VB, no blurry vision, headaches or peripheral edema, and RUQ pain. She desires an epidural for labor pain control. She plans on breast feeding. She desires LARC, but undecided on which type. Epidural desired  Pt. Was admitted to L&D where she subsequently progressed to NSVD that was complicated by a shoulder dystocia that resolved with McRoberts maneuver and suprapubic pressure as well as a 3rd degree prerineal laceration that was repaired. She otherwise had an uncomplicated postpartum course. She is now tolerating po, ambulating, pain well controlled, minimal bleeding. She is stable and ready for discharge.      Physical Exam:  General: alert, cooperative and no distress Lochia: appropriate Uterine Fundus: firm Incision: N/A DVT Evaluation: No evidence of DVT seen on physical exam. No cords or calf tenderness.  Discharge Diagnoses: Term Pregnancy-delivered  Discharge Information: Date: 07/06/2014 Activity: unrestricted and pelvic rest Diet: routine Medications: PNV and Ibuprofen Condition: stable Instructions: refer to practice specific booklet Discharge to: home Follow-up Information   Follow up with [facility]. Schedule an appointment as soon as possible for a visit in 6 weeks. (Postpartum follow up and contraception)    Specialty:  Obstetrics and Gynecology   Contact information:  [address] [place] [address] [phone]      Newborn Data: Live born female  Birth Weight: 8 lb 15.4 oz (4065 g) APGAR: 8,  9  Home with mother.  [place], Caleb G 07/06/2014, 7:24 AM  I have seen and examined this patient and agree with above documentation in the resident's note.   Nila Nephew, MD OB Fellow 07/06/2014 10:29 AM

## 2014-07-06 NOTE — Lactation Note (Signed)
This note was copied from the chart of Donna Kim. Lactation Consultation Note  Patient Name: Donna Kim [id number] Date: 07/06/2014 Reason for consult: Follow-up assessment RN gave Mother comfort gels per charge ticket.  Maternal Data    Feeding Feeding Type:  (Mom states she does not wish to latch baby at this time, but believes baby latching well. ) Length of feed: 10 min  LATCH Score/Interventions                      Lactation Tools Discussed/Used     Consult Status Consult Status: Complete    Katrine Coho 07/06/2014, 10:32 AM

## 2014-07-11 ENCOUNTER — Inpatient Hospital Stay (HOSPITAL_COMMUNITY): Admission: RE | Admit: 2014-07-11 | Payer: BC Managed Care – PPO | Source: Ambulatory Visit

## 2014-07-15 ENCOUNTER — Telehealth: Payer: Self-pay | Admitting: Obstetrics & Gynecology

## 2014-07-15 ENCOUNTER — Encounter: Payer: Self-pay | Admitting: Adult Health

## 2014-07-15 ENCOUNTER — Ambulatory Visit (INDEPENDENT_AMBULATORY_CARE_PROVIDER_SITE_OTHER): Payer: 59 | Admitting: Adult Health

## 2014-07-15 NOTE — Telephone Encounter (Signed)
Pt states delivered 07/04/2014 had stopped bleeding since delivery and woke up this am in a "puddle of blood." Pt to come in today to be evaluated.

## 2014-07-15 NOTE — Progress Notes (Signed)
Subjective:     Patient ID: Donna Kim, female   DOB: 1981/07/02, 33 y.o.   MRN: [id number]  HPI Donna Kim is a 33 year old white female who delivered a baby girl vaginally 07/04/14 and is breast feeding, she had stopped bleeding and woke up in puddle of blood this am, no pain, did have some clots.  Review of Systems See HPI Reviewed past medical,surgical, social and family history. Reviewed medications and allergies.     Objective:   Physical Exam BP 120/74  Ht 5\' 4"  (1.626 m)  Wt 185 lb (83.915 kg)  BMI 31.74 kg/m2  LMP 09/26/2013  Breastfeeding? Yes   Skin warm and dry.Pelvic: external genitalia is normal in appearance, vagina: tan discharge without odor,no bleeding now, has stitches at introitus from 3rd degree tear, some mild swelling,no discharge noted there, cervix:smooth and bulbous, uterus: about 7 week size, non tender, no masses felt, adnexa: no masses or tenderness noted.Discussed could have had some membrane turn loose and then bleed.  Assessment:     Postpartum bleeding    Plan:    Can use derma plast if needed No sex Return in 4 weeks for postpartum visit

## 2014-07-15 NOTE — Patient Instructions (Signed)
No sex yet May see more blood  Return in 4 weeks

## 2014-07-22 ENCOUNTER — Encounter (HOSPITAL_COMMUNITY): Payer: Self-pay | Admitting: Emergency Medicine

## 2014-07-22 ENCOUNTER — Emergency Department (HOSPITAL_COMMUNITY)
Admission: EM | Admit: 2014-07-22 | Discharge: 2014-07-22 | Disposition: A | Discharge status: Home / Self Care | Payer: 59 | Attending: Emergency Medicine | Admitting: Emergency Medicine

## 2014-07-22 DIAGNOSIS — S61219A Laceration without foreign body of unspecified finger without damage to nail, initial encounter: Secondary | ICD-10-CM

## 2014-07-22 DIAGNOSIS — Z79899 Other long term (current) drug therapy: Secondary | ICD-10-CM | POA: Insufficient documentation

## 2014-07-22 DIAGNOSIS — Y9389 Activity, other specified: Secondary | ICD-10-CM | POA: Insufficient documentation

## 2014-07-22 DIAGNOSIS — S61209A Unspecified open wound of unspecified finger without damage to nail, initial encounter: Secondary | ICD-10-CM | POA: Diagnosis present

## 2014-07-22 DIAGNOSIS — Z872 Personal history of diseases of the skin and subcutaneous tissue: Secondary | ICD-10-CM | POA: Diagnosis not present

## 2014-07-22 DIAGNOSIS — W261XXA Contact with sword or dagger, initial encounter: Secondary | ICD-10-CM

## 2014-07-22 DIAGNOSIS — W260XXA Contact with knife, initial encounter: Secondary | ICD-10-CM | POA: Insufficient documentation

## 2014-07-22 DIAGNOSIS — Y929 Unspecified place or not applicable: Secondary | ICD-10-CM | POA: Insufficient documentation

## 2014-07-22 MED ORDER — LIDOCAINE HCL (PF) 1 % IJ SOLN
5.0000 mL | Freq: Once | INTRAMUSCULAR | Status: DC
  Filled 2014-07-22: qty 5

## 2014-07-22 MED ORDER — BACITRACIN ZINC 500 UNIT/GM EX OINT
1.0000 "application " | TOPICAL_OINTMENT | Freq: Two times a day (BID) | CUTANEOUS | Status: DC
  Filled 2014-07-22: qty 0.9

## 2014-07-22 MED ORDER — TETANUS-DIPHTH-ACELL PERTUSSIS 5-2.5-18.5 LF-MCG/0.5 IM SUSP
0.5000 mL | Freq: Once | INTRAMUSCULAR | Status: DC
  Filled 2014-07-22: qty 0.5

## 2014-07-22 NOTE — Discharge Instructions (Signed)

## 2014-07-22 NOTE — ED Notes (Signed)
Laceration to right index finger while washing knife this morning.  Bleeding controlled.

## 2014-07-22 NOTE — ED Notes (Signed)
Graham crackers and coke given to pt.  

## 2014-07-22 NOTE — ED Provider Notes (Signed)
Medical screening examination/treatment/procedure(s) were performed by non-physician practitioner and as supervising physician I was immediately available for consultation/collaboration.   EKG Interpretation None        Maudry Diego, MD 07/22/14 1130

## 2014-07-22 NOTE — ED Notes (Signed)
Pt. Had Tdap 2 weeks ago

## 2014-07-22 NOTE — ED Provider Notes (Signed)
CSN: [id number]     Arrival date & time 07/22/14  2202 History   First MD Initiated Contact with Patient 07/22/14 [phone]     Chief Complaint  Patient presents with  . Extremity Laceration     (Consider location/radiation/quality/duration/timing/severity/associated sxs/prior Treatment) HPI Comments: Patient is an otherwise healthy 33 year old female who presents to the emergency department for laceration to right index finger. She was washing a bread knife prior to arrival and cut her finger. She is right-hand dominant. She is in a minimal amount of pain which she describes as sore. She has no difficulty moving her finger. Her last tetanus shot was 2 weeks ago. No other injuries.   The history is provided by the patient. No language interpreter was used.    Past Medical History  Diagnosis Date  . No pertinent past medical history   . Back pain 11/12/2013  . Rosacea   . Postpartum bleeding 07/15/2014   Past Surgical History  Procedure Laterality Date  . Wisdom tooth extraction     Family History  Problem Relation Age of Onset  . Adopted: Yes   History  Substance Use Topics  . Smoking status: Never Smoker   . Smokeless tobacco: Never Used  . Alcohol Use: No   OB History   Grav Para Term Preterm Abortions TAB SAB Ect Mult Living   2 2 2       2      Review of Systems  Constitutional: Negative for fever and chills.  Respiratory: Negative for shortness of breath.   Cardiovascular: Negative for chest pain.  Skin: Positive for wound.  All other systems reviewed and are negative.     Allergies  Review of patient's allergies indicates no known allergies.  Home Medications   Prior to Admission medications   Medication Sig Start Date End Date Taking? Authorizing Provider  acetaminophen (TYLENOL) 325 MG tablet Take 650 mg by mouth every 6 (six) hours as needed. Patient took this medication for pain.     Historical Provider, MD  ibuprofen (ADVIL,MOTRIN) 600 MG tablet Take 1  tablet (600 mg total) by mouth every 6 (six) hours. 07/06/14   York Ram Melancon, MD  metroNIDAZOLE (METROGEL) 1 % gel Apply topically daily.    Historical Provider, MD  Prenatal Vit-Fe Sulfate-FA (PRENATAL VITAMIN PO) Take by mouth.    Historical Provider, MD   BP 128/96  Pulse 98  Temp(Src) 98.4 F (36.9 C) (Oral)  Resp 16  Ht 5\' 5"  (1.651 m)  Wt 185 lb (83.915 kg)  BMI 30.79 kg/m2  SpO2 100% Physical Exam  Nursing note and vitals reviewed. Constitutional: She is oriented to person, place, and time. She appears well-developed and well-nourished. No distress.  HENT:  Head: Normocephalic and atraumatic.  Right Ear: External ear normal.  Left Ear: External ear normal.  Nose: Nose normal.  Mouth/Throat: Oropharynx is clear and moist.  Eyes: Conjunctivae are normal.  Neck: Normal range of motion.  Cardiovascular: Normal rate, regular rhythm and normal heart sounds.   Pulses:      Radial pulses are 2+ on the right side.  Capillary refill < 3 seconds in right index finger. Sensation intact.   Pulmonary/Chest: Effort normal and breath sounds normal. No stridor. No respiratory distress. She has no wheezes. She has no rales.  Abdominal: Soft. She exhibits no distension.  Musculoskeletal: Normal range of motion.  Flexion and extension 5/5, tested against resistance in right index finger.    Neurological: She is alert and oriented  to person, place, and time. She has normal strength.  Skin: Skin is warm and dry. She is not diaphoretic. No erythema.  2 cm laceration to distal right index finger.   Psychiatric: She has a normal mood and affect. Her behavior is normal.    ED Course  Procedures (including critical care time) Labs Review Labs Reviewed - No data to display  Imaging Review No results found.   EKG Interpretation None      LACERATION REPAIR Performed by: Cleatrice Burke Authorized by: Cleatrice Burke Consent: Verbal consent obtained. Risks and benefits: risks,  benefits and alternatives were discussed Consent given by: patient Patient identity confirmed: provided demographic data Prepped and Draped in normal sterile fashion Wound explored  Laceration Location: right index finger  Laceration Length: 2 cm  No Foreign Bodies seen or palpated  Anesthesia: digital block  Local anesthetic: lidocaine 1%   Anesthetic total: 4 ml  Irrigation method: syringe Amount of cleaning: standard  Skin closure: 5-0 Ethilon  Number of sutures: 3  Technique: simple interrupted   Patient tolerance: Patient tolerated the procedure well with no immediate complications.  MDM   Final diagnoses:  Finger laceration, initial encounter    Tdap booster UTD. Wound cleaning complete with pressure irrigation, bottom of wound visualized, no foreign bodies appreciated. Laceration occurred < 8 hours prior to repair which was well tolerated. Pt has no co morbidities to effect normal wound healing. Discussed suture home care w pt and answered questions. Pt to f-u for wound check and suture removal in 7 days. Pt is hemodynamically stable w no complaints prior to dc.       Elwyn Lade, PA-C 07/22/14 1058

## 2014-07-29 ENCOUNTER — Encounter: Payer: Self-pay | Admitting: Family Medicine

## 2014-07-29 ENCOUNTER — Ambulatory Visit (INDEPENDENT_AMBULATORY_CARE_PROVIDER_SITE_OTHER): Payer: 59 | Admitting: Family Medicine

## 2014-07-29 VITALS — BP 110/72 | Ht 65.0 in | Wt 183.8 lb

## 2014-07-29 DIAGNOSIS — R21 Rash and other nonspecific skin eruption: Secondary | ICD-10-CM

## 2014-07-29 NOTE — Progress Notes (Signed)
   Subjective:    Patient ID: Donna Kim, female    DOB: [birth year], 33 y.o.   MRN: [id number]  HPI Patient arrives to have stiches removed from right index finger.  Up to date on vaccination shots Review of Systems No complaints no cough no headache no chest pain    Objective:   Physical Exam Alert vitals stable. Lungs clear heart regular in rhythm. Lacerated finger healing well. Suture removed with minimal difficulty       Assessment & Plan:  Impression suture removal plan wound care discussed. WSL

## 2014-08-12 ENCOUNTER — Ambulatory Visit (INDEPENDENT_AMBULATORY_CARE_PROVIDER_SITE_OTHER): Payer: 59 | Admitting: Adult Health

## 2014-08-12 ENCOUNTER — Encounter: Payer: Self-pay | Admitting: Adult Health

## 2014-08-12 DIAGNOSIS — Z3202 Encounter for pregnancy test, result negative: Secondary | ICD-10-CM

## 2014-08-12 DIAGNOSIS — Z309 Encounter for contraceptive management, unspecified: Secondary | ICD-10-CM

## 2014-08-12 DIAGNOSIS — Z30011 Encounter for initial prescription of contraceptive pills: Secondary | ICD-10-CM

## 2014-08-12 HISTORY — DX: Encounter for contraceptive management, unspecified: Z30.9

## 2014-08-12 LAB — POCT URINE PREGNANCY: Preg Test, Ur: NEGATIVE

## 2014-08-12 MED ORDER — METRONIDAZOLE 1 % EX GEL: CUTANEOUS | Status: DC

## 2014-08-12 MED ORDER — NORETHINDRONE 0.35 MG PO TABS: 1.0000 | ORAL_TABLET | Freq: Every day | ORAL | Status: DC

## 2014-08-12 NOTE — Patient Instructions (Signed)
Intrauterine Device Information An intrauterine device (IUD) is inserted into your uterus to prevent pregnancy. There are two types of IUDs available:   Copper IUD--This type of IUD is wrapped in copper wire and is placed inside the uterus. Copper makes the uterus and fallopian tubes produce a fluid that kills sperm. The copper IUD can stay in place for 10 years.  Hormone IUD--This type of IUD contains the hormone progestin (synthetic progesterone). The hormone thickens the cervical mucus and prevents sperm from entering the uterus. It also thins the uterine lining to prevent implantation of a fertilized egg. The hormone can weaken or kill the sperm that get into the uterus. One type of hormone IUD can stay in place for 5 years, and another type can stay in place for 3 years. Your health care provider will make sure you are a good candidate for a contraceptive IUD. Discuss with your health care provider the possible side effects.  ADVANTAGES OF AN INTRAUTERINE DEVICE  IUDs are highly effective, reversible, long acting, and low maintenance.   There are no estrogen-related side effects.   An IUD can be used when breastfeeding.   IUDs are not associated with weight gain.   The copper IUD works immediately after insertion.   The hormone IUD works right away if inserted within 7 days of your period starting. You will need to use a backup method of birth control for 7 days if the hormone IUD is inserted at any other time in your cycle.  The copper IUD does not interfere with your female hormones.   The hormone IUD can make heavy menstrual periods lighter and decrease cramping.   The hormone IUD can be used for 3 or 5 years.   The copper IUD can be used for 10 years. DISADVANTAGES OF AN INTRAUTERINE DEVICE  The hormone IUD can be associated with irregular bleeding patterns.   The copper IUD can make your menstrual flow heavier and more painful.   You may experience cramping and  vaginal bleeding after insertion.  Document Released: 10/22/2004 Document Revised: 07/21/2013 Document Reviewed: 05/09/2013 [facility] Patient Information 2015 [place], [place]. This information is not intended to replace advice given to you by your health care provider. Make sure you discuss any questions you have with your health care provider. Oral Contraception Use Oral contraceptive pills (OCPs) are medicines taken to prevent pregnancy. OCPs work by preventing the ovaries from releasing eggs. The hormones in OCPs also cause the cervical mucus to thicken, preventing the sperm from entering the uterus. The hormones also cause the uterine lining to become thin, not allowing a fertilized egg to attach to the inside of the uterus. OCPs are highly effective when taken exactly as prescribed. However, OCPs do not prevent sexually transmitted diseases (STDs). Safe sex practices, such as using condoms along with an OCP, can help prevent STDs. Before taking OCPs, you may have a physical exam and Pap test. Your health care provider may also order blood tests if necessary. Your health care provider will make sure you are a good candidate for oral contraception. Discuss with your health care provider the possible side effects of the OCP you may be prescribed. When starting an OCP, it can take 2 to 3 months for the body to adjust to the changes in hormone levels in your body.  HOW TO TAKE ORAL CONTRACEPTIVE PILLS Your health care provider may advise you on how to start taking the first cycle of OCPs. Otherwise, you can:   Start  on day 1 of your menstrual period. You will not need any backup contraceptive protection with this start time.   Start on the first Sunday after your menstrual period or the day you get your prescription. In these cases, you will need to use backup contraceptive protection for the first week.   Start the pill at any time of your cycle. If you take the pill within 5 days of the start of  your period, you are protected against pregnancy right away. In this case, you will not need a backup form of birth control. If you start at any other time of your menstrual cycle, you will need to use another form of birth control for 7 days. If your OCP is the type called a minipill, it will protect you from pregnancy after taking it for 2 days (48 hours). After you have started taking OCPs:   If you forget to take 1 pill, take it as soon as you remember. Take the next pill at the regular time.   If you miss 2 or more pills, call your health care provider because different pills have different instructions for missed doses. Use backup birth control until your next menstrual period starts.   If you use a 28-day pack that contains inactive pills and you miss 1 of the last 7 pills (pills with no hormones), it will not matter. Throw away the rest of the non-hormone pills and start a new pill pack.  No matter which day you start the OCP, you will always start a new pack on that same day of the week. Have an extra pack of OCPs and a backup contraceptive method available in case you miss some pills or lose your OCP pack.  HOME CARE INSTRUCTIONS   Do not smoke.   Always use a condom to protect against STDs. OCPs do not protect against STDs.   Use a calendar to mark your menstrual period days.   Read the information and directions that came with your OCP. Talk to your health care provider if you have questions.  SEEK MEDICAL CARE IF:   You develop nausea and vomiting.   You have abnormal vaginal discharge or bleeding.   You develop a rash.   You miss your menstrual period.   You are losing your hair.   You need treatment for mood swings or depression.   You get dizzy when taking the OCP.   You develop acne from taking the OCP.   You become pregnant.  SEEK IMMEDIATE MEDICAL CARE IF:   You develop chest pain.   You develop shortness of breath.   You have an  uncontrolled or severe headache.   You develop numbness or slurred speech.   You develop visual problems.   You develop pain, redness, and swelling in the legs.  Document Released: 11/07/2011 Document Revised: 04/04/2014 Document Reviewed: 05/09/2013 [facility] Patient Information 2015 [place], [place]. This information is not intended to replace advice given to you by your health care provider. Make sure you discuss any questions you have with your health care provider. Start POP Sunday use condoms

## 2014-08-12 NOTE — Progress Notes (Signed)
Patient ID: Donna Kim, female   DOB: Aug 06, 1981, 33 y.o.   MRN: [id number] Donna Kim is a 33 year old white female,married in for her postpartum visit.  Delivery Date:07/04/14  Method of Delivery: Vaginal delivery baby girl Kaylee,8 lbs 15 ozs. With third degree and stitches  Sexual Activity since delivery: Yes x 1  Method of Feeding: Breast and bottle  Number of weeks bleeding post delivery: 3-4  Reviewed past medical,surgical, social and family history. Reviewed medications and allergies.  Review of Systems: Patient denies any headaches, blurred vision, shortness of breath, chest pain, abdominal pain, problems with bowel movements, urination, or intercourse. No joint pain or mood swings.  Depression Score: 6  BP 118/76  Ht 5\' 5"  (1.651 m)  Wt 184 lb 8 oz (83.689 kg)  BMI 30.70 kg/m2  Breastfeeding? Yes Pelvic Exam:   External genitalia is normal in appearance.  The vagina is normal in appearance. The cervix is bulbous.  Uterus is felt to be normal size, shape, and contour, well involuted.  No adnexal masses or tenderness noted. Discussed birth control, will try POP,but may want IUD  Impression:  Status post delivery, post partum check, depression screening, contraceptive management.   Plan:   Rx Micronor disp 1 pack take 1 daily with 11 refills Refilled Metrogel x 3 for rosacea  Review handouts on ParaGard and OC use Physical in 1 year,pap in 2017

## 2014-08-15 ENCOUNTER — Other Ambulatory Visit: Payer: Self-pay | Admitting: Adult Health

## 2014-08-15 MED ORDER — METRONIDAZOLE 0.75 % EX GEL: 1.0000 "application " | Freq: Two times a day (BID) | CUTANEOUS | Status: DC

## 2014-09-16 ENCOUNTER — Ambulatory Visit (INDEPENDENT_AMBULATORY_CARE_PROVIDER_SITE_OTHER): Payer: 59 | Admitting: Family Medicine

## 2014-09-16 ENCOUNTER — Encounter: Payer: Self-pay | Admitting: Family Medicine

## 2014-09-16 VITALS — BP 134/84 | Ht 65.0 in | Wt 183.0 lb

## 2014-09-16 DIAGNOSIS — R5383 Other fatigue: Secondary | ICD-10-CM

## 2014-09-16 LAB — CBC WITH DIFFERENTIAL/PLATELET
Basophils Absolute: 0 10*3/uL (ref 0.0–0.1)
Basophils Relative: 0 % (ref 0–1)
Eosinophils Absolute: 0.1 10*3/uL (ref 0.0–0.7)
Eosinophils Relative: 1 % (ref 0–5)
HCT: 39.4 % (ref 36.0–46.0)
Hemoglobin: 13.6 g/dL (ref 12.0–15.0)
LYMPHS ABS: 1.7 10*3/uL (ref 0.7–4.0)
LYMPHS PCT: 23 % (ref 12–46)
MCH: 28.9 pg (ref 26.0–34.0)
MCHC: 34.5 g/dL (ref 30.0–36.0)
MCV: 83.7 fL (ref 78.0–100.0)
Monocytes Absolute: 0.5 10*3/uL (ref 0.1–1.0)
Monocytes Relative: 6 % (ref 3–12)
NEUTROS ABS: 5.3 10*3/uL (ref 1.7–7.7)
Neutrophils Relative %: 70 % (ref 43–77)
PLATELETS: 189 10*3/uL (ref 150–400)
RBC: 4.71 MIL/uL (ref 3.87–5.11)
RDW: 14.7 % (ref 11.5–15.5)
WBC: 7.5 10*3/uL (ref 4.0–10.5)

## 2014-09-16 LAB — HEPATIC FUNCTION PANEL
ALK PHOS: 97 U/L (ref 39–117)
ALT: 18 U/L (ref 0–35)
AST: 17 U/L (ref 0–37)
Albumin: 4.6 g/dL (ref 3.5–5.2)
BILIRUBIN DIRECT: 0.1 mg/dL (ref 0.0–0.3)
BILIRUBIN INDIRECT: 0.4 mg/dL (ref 0.2–1.2)
BILIRUBIN TOTAL: 0.5 mg/dL (ref 0.2–1.2)
TOTAL PROTEIN: 7.7 g/dL (ref 6.0–8.3)

## 2014-09-16 LAB — BASIC METABOLIC PANEL
BUN: 17 mg/dL (ref 6–23)
CO2: 24 mEq/L (ref 19–32)
Calcium: 10.3 mg/dL (ref 8.4–10.5)
Chloride: 104 mEq/L (ref 96–112)
Creat: 0.81 mg/dL (ref 0.50–1.10)
Glucose, Bld: 83 mg/dL (ref 70–99)
POTASSIUM: 4.4 meq/L (ref 3.5–5.3)
Sodium: 138 mEq/L (ref 135–145)

## 2014-09-16 LAB — TSH: TSH: 0.895 u[IU]/mL (ref 0.350–4.500)

## 2014-09-16 LAB — T4: T4, Total: 6.8 ug/dL (ref 4.5–12.0)

## 2014-09-16 NOTE — Progress Notes (Signed)
   Subjective:    Patient ID: Donna Kim, female    DOB: 1981/02/06, 33 y.o.   MRN: [id number]  HPIHaving fatigue, not able to lose weight, foggy thinking. Wanting to have bloodwork for hormone levels and thyroid. Recent pregnancy. Baby is 62 weeks old. Pt is breast feeding. Had these symptoms before pregnancy.   Pt notes hx of antidepr in 2008, took it awhile, didn't help much , had a diminished energy  Feeling brain fog and out of it a lot  Sees a counselor and they rec a test for  ADD online, pt took self test which confirmed adhd  Hormones feel out of whack  Started the minipill about a mo after giving birth  Considering para gard  No bw abnormalities during preg  Has not lost weight and trying to eat the right things   Thyroid questions and concerns, feeling cold at timpt notes chronic cough and quest reflux as to whethe it is an issue.  Exercise walking two or three times per wk,  Stay at home mom, teaches music lessons,  Two children  Pt gets very angry and frustrated intermittently, more so over the five or six yrs ish.  Has worsened since birth of child frustated often,  Sadness not an issue at this time  Chronic cough off and on, seems to be related to reflux     Review of Systems Occasional headache no chest pain no excessive shortness breath no sweating no weight loss no diarrhea no constipation ROS otherwise negative    Objective:   Physical Exam  Alert anxious appearing. HEENT normal. Neck supple. Lungs clear. Heart rare in rhythm. Neuro exam intact. Thyroid nonpalpable.      Assessment & Plan:  Impression 1 fatigue discussed #2 chronic anxiety discussed #3 probable ADHD discussed #4 intermittent cough 1020 related to reflux. #5 excess her weight gain plan appropriate blood work. Return for further discussion with consideration towards medication for difficulties. WSL

## 2014-09-17 LAB — PROLACTIN: Prolactin: 14.1 ng/mL

## 2014-09-18 LAB — ESTROGENS, TOTAL: ESTROGEN: 157 pg/mL

## 2014-09-28 ENCOUNTER — Ambulatory Visit: Payer: 59 | Admitting: Women's Health

## 2014-09-28 ENCOUNTER — Encounter: Payer: Self-pay | Admitting: Family Medicine

## 2014-09-28 ENCOUNTER — Ambulatory Visit (INDEPENDENT_AMBULATORY_CARE_PROVIDER_SITE_OTHER): Payer: 59 | Admitting: Family Medicine

## 2014-09-28 VITALS — BP 130/80 | Ht 65.0 in | Wt 184.0 lb

## 2014-09-28 DIAGNOSIS — F900 Attention-deficit hyperactivity disorder, predominantly inattentive type: Secondary | ICD-10-CM

## 2014-09-28 MED ORDER — METHYLPHENIDATE HCL ER (OSM) 18 MG PO TBCR: 18.0000 mg | EXTENDED_RELEASE_TABLET | Freq: Every day | ORAL | Status: DC

## 2014-09-28 MED ORDER — AMOXICILLIN 500 MG PO CAPS: 500.0000 mg | ORAL_CAPSULE | Freq: Three times a day (TID) | ORAL | Status: DC

## 2014-09-28 NOTE — Progress Notes (Signed)
   Subjective:    Patient ID: Donna Kim, female    DOB: March 05, 1981, 33 y.o.   MRN: [id number]  HPI Patient is here today for a follow up visit on probable ADHD. Patient has had bloodwork completed also. Patient states that she has head congestion, sinus pressure and headaches. This has been present for over 1 week now.  No other concer Extremely long discussion regarding patient's ongoing symptoms regarding focusing. Not as good as it used to be. In fact during school it was also suboptimum. Patient always suspected she had inattentive predominant ADHD. Unsure of family history because adopted.  This is worsened in her adult years ears. Now with multiple children at home seems to be extremely difficult.  Notes headache frontal maxillary sinus fullness diminished energy. No high fevers.   Review of Systems No headache no chest pain no back pain no abdominal pain no change in bowel habits blood in stool ROS otherwise negative    Objective:   Physical Exam Alert no apparent distress. Lungs clear. Heart regular rhythm. H&T moderate nasal congestion frontal tenderness. Neck supple.  DSM criteria questioned anddiscussed and patient confirms for primary inattentive type ADHD     Assessment & Plan:  Impression primary inattentive type ADHD discussed at great length. Patient also suffers from occasional challenges with down mood, but no frank depression. Would like to try medication for ADHD. She is breast-feeding. However less than half the of her diet. This is category C discussed with patient at length she would like to press on with medication. Plan initiate low-dose Concerta. Exercise strongly encouraged. Antibiotics prescribed for sinusitis. Easily 35to 40 minutes spent in discussion of patient's diagnosis.

## 2014-09-29 ENCOUNTER — Telehealth: Payer: Self-pay | Admitting: Family Medicine

## 2014-09-29 NOTE — Telephone Encounter (Signed)
methylphenidate (CONCERTA) 18 MG PO CR tablet was DENIED by her insurance ([employer]) as it is non-formulary on their list, please see green folder for a list of alternatives, please advise

## 2014-10-02 DIAGNOSIS — F900 Attention-deficit hyperactivity disorder, predominantly inattentive type: Secondary | ICD-10-CM | POA: Insufficient documentation

## 2014-10-03 ENCOUNTER — Encounter: Payer: Self-pay | Admitting: Family Medicine

## 2014-10-03 NOTE — Telephone Encounter (Signed)
See form they should cover after i filled out

## 2014-10-04 NOTE — Telephone Encounter (Signed)
Ok that's really weird we will do brand name but that's a shame if pt has to pay higher copay

## 2014-10-04 NOTE — Telephone Encounter (Signed)
Faxed in completed form, methylphenidate (CONCERTA) 18 MG PO CR tablet was DENIED.  Pt has to have had a failure on or intolerance to brand name CONCERTA or we have to show documentation indicating how the generic would work if the patient had an adverse reaction to an inactive ingredient in the brand name Please advise See denial in green folder

## 2014-10-05 ENCOUNTER — Telehealth: Payer: Self-pay | Admitting: Family Medicine

## 2014-10-05 MED ORDER — METHYLPHENIDATE HCL ER (OSM) 18 MG PO TBCR: 18.0000 mg | EXTENDED_RELEASE_TABLET | Freq: Every day | ORAL | Status: DC

## 2014-10-05 NOTE — Telephone Encounter (Signed)
Rx up front for patient pick up. Patient notified. 

## 2014-10-05 NOTE — Telephone Encounter (Signed)
Rx prior auth APPROVED for pt's name brand CONCERTA ER 18mg , expires 10/06/2015 through [employer], faxed approval to [facility]

## 2014-10-13 ENCOUNTER — Ambulatory Visit (INDEPENDENT_AMBULATORY_CARE_PROVIDER_SITE_OTHER): Payer: 59 | Admitting: Advanced Practice Midwife

## 2014-10-13 ENCOUNTER — Encounter: Payer: Self-pay | Admitting: Advanced Practice Midwife

## 2014-10-13 VITALS — BP 120/98 | Ht 65.0 in | Wt 186.0 lb

## 2014-10-13 DIAGNOSIS — Z309 Encounter for contraceptive management, unspecified: Secondary | ICD-10-CM

## 2014-10-13 DIAGNOSIS — Z3009 Encounter for other general counseling and advice on contraception: Secondary | ICD-10-CM

## 2014-10-13 NOTE — Progress Notes (Signed)
[facility] Visit  Patient name: Donna Kim MRN [id number]  Date of birth: 07-08-1981  CC & HPI:  Donna Kim is a 33 y.o. Caucasian female presenting today for discussion of birth control. She is breastfeeding and on micronoir.  She had thought she would want the Paraguard, but now has a "funny feeling about it".  Does not have BTB on micronoir, doesn't forget pills. Discussed other options. May get Nexplanon, but because there isn't a problem with POP's, will just stay on them for now.  Pertinent History Reviewed:  Medical & Surgical Hx:   Past Medical History  Diagnosis Date  . No pertinent past medical history   . Back pain 11/12/2013  . Rosacea   . Postpartum bleeding 07/15/2014  . Contraceptive management 08/12/2014   Past Surgical History  Procedure Laterality Date  . Wisdom tooth extraction     Medications: Reviewed & Updated - see associated section Social History: Reviewed -  reports that she has never smoked. She has never used smokeless tobacco.  Objective Findings:  Vitals: BP 120/98 mmHg  Ht 5\' 5"  (1.651 m)  Wt 186 lb (84.369 kg)  BMI 30.95 kg/m2  Breastfeeding? Yes  Physical Examination: General appearance - alert, well appearing, and in no distress Mental status - alert, oriented to person, place, and time Extremities - no pedal edema noted Skin - normal coloration and turgor, no rashes, no suspicious skin lesions noted  No results found for this or any previous visit (from the past 24 hour(s)).   Assessment & Plan:  A:   Contraception counseling P:  Sign order form for Nexplanon.  If she decides to get it, call office and we will send in the form.  CRESENZO-DISHMAN,Benett Swoyer CNM 10/13/2014 4:05 PM

## 2014-11-23 ENCOUNTER — Ambulatory Visit: Payer: 59 | Admitting: Family Medicine

## 2014-12-07 ENCOUNTER — Ambulatory Visit: Payer: 59 | Admitting: Family Medicine

## 2014-12-16 ENCOUNTER — Ambulatory Visit (INDEPENDENT_AMBULATORY_CARE_PROVIDER_SITE_OTHER): Payer: 59 | Admitting: Family Medicine

## 2014-12-16 ENCOUNTER — Encounter: Payer: Self-pay | Admitting: Family Medicine

## 2014-12-16 VITALS — BP 122/82 | Ht 65.0 in | Wt 191.0 lb

## 2014-12-16 DIAGNOSIS — O99345 Other mental disorders complicating the puerperium: Secondary | ICD-10-CM

## 2014-12-16 DIAGNOSIS — F900 Attention-deficit hyperactivity disorder, predominantly inattentive type: Secondary | ICD-10-CM

## 2014-12-16 DIAGNOSIS — G4700 Insomnia, unspecified: Secondary | ICD-10-CM

## 2014-12-16 DIAGNOSIS — O906 Postpartum mood disturbance: Secondary | ICD-10-CM

## 2014-12-16 DIAGNOSIS — F53 Postpartum depression: Secondary | ICD-10-CM

## 2014-12-16 MED ORDER — BUPROPION HCL ER (SR) 150 MG PO TB12: ORAL_TABLET | ORAL | Status: DC

## 2014-12-16 NOTE — Progress Notes (Signed)
   Subjective:    Patient ID: Donna Kim, female    DOB: September 07, 1981, 34 y.o.   MRN: [id number]  HPI Patient was seen today for ADD checkup. -weight, vital signs reviewed.  The following items were covered. -Compliance with medication : Quit taking it  - Eating patterns : Not eating well when on the Concerta  -sleeping: Insomnia with the Concerta  -Additional issues or questions: Pt took the Concerta for 3 weeks and stopped d/t the insomnia.   Worst insomnia with the med, and wide awake with it  And wide awak with sig challenges with it  Having more post partum challenges with recent preg  Child is high energy and gets frustrated with her  Patient no longer breast-feeding. Having periods of feeling down. Sadness. No suicidal or homicidal thoughts. Feels that her a sliding depression is now more significant. Postpartum   Review of Systems -No headache no chest pain but no back pain no abdominal pain no change by habits no blood in stool    Obj ective:   Physical Exam Alert no acute distress vital stable HEENT normal. Lungs clear. Heart regular in rhythm.       Assessment & Plan:  Impression ADHD with inability to take Concerta No. 2 insomnia worsening #3 depression now exacerbated by postpartum status. Discussed at length. Plan 25 minutes spent most in discussion. Unable to take Concerta as noted. Initiate Wellbutrin rationale discussed. With multiple comorbidities and years worth a symptomatology now worsening time for psychiatry referral discussed. Patient in agreement Review SL

## 2014-12-17 DIAGNOSIS — G4700 Insomnia, unspecified: Secondary | ICD-10-CM | POA: Insufficient documentation

## 2015-01-09 ENCOUNTER — Ambulatory Visit (INDEPENDENT_AMBULATORY_CARE_PROVIDER_SITE_OTHER): Payer: 59 | Admitting: Family Medicine

## 2015-01-09 VITALS — BP 112/78 | Ht 65.0 in | Wt 191.0 lb

## 2015-01-09 DIAGNOSIS — G4700 Insomnia, unspecified: Secondary | ICD-10-CM

## 2015-01-09 DIAGNOSIS — F329 Major depressive disorder, single episode, unspecified: Secondary | ICD-10-CM

## 2015-01-09 DIAGNOSIS — F900 Attention-deficit hyperactivity disorder, predominantly inattentive type: Secondary | ICD-10-CM

## 2015-01-09 DIAGNOSIS — F32A Depression, unspecified: Secondary | ICD-10-CM

## 2015-01-09 NOTE — Progress Notes (Signed)
   Subjective:    Patient ID: Donna Kim, female    DOB: December 13, 1980, 34 y.o.   MRN: [id number]  HPIStarted wellbutrin around Jan 15th. Taking wellbutrin 150mg  one a day. Has had sudicial thoughts. Has had thoughts of cutting herself a couple of times. Last time was a few days ago when she had thoughts of cutting herself. Having headaches, hot flashes, and dizziness. Hearing things that are not there.   Couple weeks back started having these symptoms one wk after staring th ewellbutrin  Had briedf thoughts of self harm but never would  Then had crying spell and felt bad  Worries at times about an accident  Agitated thoughts intermittently and motor agitation symptoms, feeling that some one is crying but no one is  Having aud halluc at times  migr intermittently, wondering if it is cong, though congestion has now resolved.  Upper resp cold now bettter, some h a since  beh health just caleed cannot see ti 23 and iatrist end of march  [phone] Review of Systems Significant trouble sleeping, no acute suicidal thoughts. No chest pain no abdominal pain    Objective:   Physical Exam  Alert no apparent distress vital stable HEENT normal. Lungs clear heart rare rhythm      Assessment & Plan:  Impression considerable Wellbutrin side effects. This is unfortunate because of history of sensitivity to Lexapro. Also unfortunately because the patient was hoping to help her chronic depression. #2 insomnia somewhat worse #3 migraine headaches discussed #4 ADHD ongoing and frustration plan of note several weeks we made referral to psychiatrist. She is frustrated because only due to see counselor shortly and cannot even begin to see behavioral health psychiatrist filled in the march. Was hoping for a sooner referral and requesting on something different. In [place]. 25 minutes spent most in discussion. WSL

## 2015-01-10 ENCOUNTER — Encounter: Payer: Self-pay | Admitting: Family Medicine

## 2015-01-24 ENCOUNTER — Ambulatory Visit (INDEPENDENT_AMBULATORY_CARE_PROVIDER_SITE_OTHER): Payer: 59 | Admitting: Psychiatry

## 2015-01-24 ENCOUNTER — Encounter (HOSPITAL_COMMUNITY): Payer: Self-pay | Admitting: Psychiatry

## 2015-01-24 DIAGNOSIS — F39 Unspecified mood [affective] disorder: Secondary | ICD-10-CM

## 2015-01-24 NOTE — Patient Instructions (Signed)
Discussed orally 
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
## 2015-02-08 ENCOUNTER — Ambulatory Visit (INDEPENDENT_AMBULATORY_CARE_PROVIDER_SITE_OTHER): Payer: 59 | Admitting: Psychiatry

## 2015-02-08 DIAGNOSIS — F32A Depression, unspecified: Secondary | ICD-10-CM

## 2015-02-08 DIAGNOSIS — F329 Major depressive disorder, single episode, unspecified: Secondary | ICD-10-CM
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
## 2015-02-08 NOTE — Patient Instructions (Signed)
Discussed orally 

## 2015-02-21 ENCOUNTER — Encounter (HOSPITAL_COMMUNITY): Payer: Self-pay | Admitting: Psychiatry

## 2015-02-21 ENCOUNTER — Ambulatory Visit (INDEPENDENT_AMBULATORY_CARE_PROVIDER_SITE_OTHER): Payer: 59 | Admitting: Psychiatry

## 2015-02-21 VITALS — BP 120/45 | HR 83 | Ht 65.0 in | Wt 190.0 lb

## 2015-02-21 DIAGNOSIS — F900 Attention-deficit hyperactivity disorder, predominantly inattentive type: Secondary | ICD-10-CM

## 2015-02-21 DIAGNOSIS — F329 Major depressive disorder, single episode, unspecified: Secondary | ICD-10-CM

## 2015-02-21 DIAGNOSIS — F988 Other specified behavioral and emotional disorders with onset usually occurring in childhood and adolescence: Secondary | ICD-10-CM

## 2015-02-21 MED ORDER — METHYLPHENIDATE HCL 10 MG PO TABS: 10.0000 mg | ORAL_TABLET | Freq: Two times a day (BID) | ORAL | Status: DC
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
## 2015-02-22 ENCOUNTER — Ambulatory Visit (INDEPENDENT_AMBULATORY_CARE_PROVIDER_SITE_OTHER): Payer: 59 | Admitting: Psychiatry

## 2015-02-22 DIAGNOSIS — F909 Attention-deficit hyperactivity disorder, unspecified type: Secondary | ICD-10-CM | POA: Diagnosis not present

## 2015-02-22 DIAGNOSIS — F32A Depression, unspecified: Secondary | ICD-10-CM

## 2015-02-22 DIAGNOSIS — F329 Major depressive disorder, single episode, unspecified: Secondary | ICD-10-CM

## 2015-02-22 DIAGNOSIS — F988 Other specified behavioral and emotional disorders with onset usually occurring in childhood and adolescence: Secondary | ICD-10-CM
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
## 2015-02-22 NOTE — Patient Instructions (Signed)
Discussed orally 

## 2015-03-02 ENCOUNTER — Telehealth (HOSPITAL_COMMUNITY): Payer: Self-pay | Admitting: *Deleted

## 2015-03-02 NOTE — Telephone Encounter (Signed)
Completed prior authorization for Methylphenidate online with covermymeds. Awaiting decision to be faxed.

## 2015-03-03 ENCOUNTER — Telehealth (HOSPITAL_COMMUNITY): Payer: Self-pay | Admitting: *Deleted

## 2015-03-03 NOTE — Telephone Encounter (Signed)
lmtcb to inform pt that her medication was approved for refill from insurance company. Number provided

## 2015-03-08 ENCOUNTER — Ambulatory Visit (INDEPENDENT_AMBULATORY_CARE_PROVIDER_SITE_OTHER): Payer: 59 | Admitting: Psychiatry

## 2015-03-08 DIAGNOSIS — F909 Attention-deficit hyperactivity disorder, unspecified type: Secondary | ICD-10-CM | POA: Diagnosis not present

## 2015-03-08 DIAGNOSIS — F988 Other specified behavioral and emotional disorders with onset usually occurring in childhood and adolescence: Secondary | ICD-10-CM
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
## 2015-03-08 NOTE — Patient Instructions (Signed)
Discussed orally 

## 2015-03-22 ENCOUNTER — Ambulatory Visit (INDEPENDENT_AMBULATORY_CARE_PROVIDER_SITE_OTHER): Payer: 59 | Admitting: Psychiatry

## 2015-03-22 ENCOUNTER — Encounter (HOSPITAL_COMMUNITY): Payer: Self-pay | Admitting: Psychiatry

## 2015-03-22 VITALS — BP 114/76 | HR 75 | Ht 65.0 in | Wt 185.6 lb

## 2015-03-22 DIAGNOSIS — F329 Major depressive disorder, single episode, unspecified: Secondary | ICD-10-CM

## 2015-03-22 DIAGNOSIS — F909 Attention-deficit hyperactivity disorder, unspecified type: Secondary | ICD-10-CM | POA: Diagnosis not present

## 2015-03-22 DIAGNOSIS — F988 Other specified behavioral and emotional disorders with onset usually occurring in childhood and adolescence: Secondary | ICD-10-CM

## 2015-03-22 DIAGNOSIS — F53 Puerperal psychosis: Secondary | ICD-10-CM | POA: Diagnosis not present

## 2015-03-22 DIAGNOSIS — F32A Depression, unspecified: Secondary | ICD-10-CM

## 2015-03-22 MED ORDER — CLONAZEPAM 0.5 MG PO TABS: 0.5000 mg | ORAL_TABLET | Freq: Every day | ORAL | Status: DC | PRN

## 2015-03-22 MED ORDER — METHYLPHENIDATE HCL 20 MG PO TABS: 20.0000 mg | ORAL_TABLET | Freq: Two times a day (BID) | ORAL | Status: DC
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
## 2015-03-23 ENCOUNTER — Ambulatory Visit (INDEPENDENT_AMBULATORY_CARE_PROVIDER_SITE_OTHER): Payer: 59 | Admitting: Psychiatry

## 2015-03-23 DIAGNOSIS — F988 Other specified behavioral and emotional disorders with onset usually occurring in childhood and adolescence: Secondary | ICD-10-CM

## 2015-03-23 DIAGNOSIS — F32A Depression, unspecified: Secondary | ICD-10-CM

## 2015-03-23 DIAGNOSIS — F909 Attention-deficit hyperactivity disorder, unspecified type: Secondary | ICD-10-CM | POA: Diagnosis not present

## 2015-03-23 DIAGNOSIS — F329 Major depressive disorder, single episode, unspecified: Secondary | ICD-10-CM

## 2015-03-23 NOTE — Patient Instructions (Signed)
Discussed orally 
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
## 2015-03-27 ENCOUNTER — Encounter: Payer: Self-pay | Admitting: Family Medicine

## 2015-03-27 ENCOUNTER — Ambulatory Visit (INDEPENDENT_AMBULATORY_CARE_PROVIDER_SITE_OTHER): Payer: 59 | Admitting: Family Medicine

## 2015-03-27 VITALS — Temp 97.8°F | Ht 65.0 in | Wt 186.8 lb

## 2015-03-27 DIAGNOSIS — J310 Chronic rhinitis: Secondary | ICD-10-CM

## 2015-03-27 DIAGNOSIS — J329 Chronic sinusitis, unspecified: Secondary | ICD-10-CM | POA: Diagnosis not present

## 2015-03-27 MED ORDER — AMOXICILLIN 500 MG PO CAPS: 500.0000 mg | ORAL_CAPSULE | Freq: Three times a day (TID) | ORAL | Status: DC

## 2015-03-27 NOTE — Progress Notes (Signed)
   Subjective:    Patient ID: Donna Kim, female    DOB: [birth year], 34 y.o.   MRN: [id number]  Cough This is a new problem. The current episode started in the past 7 days. Associated symptoms include ear congestion, headaches, nasal congestion, a sore throat and wheezing. Treatments tried: zinc cold thrapy.     Children with this  Kids sick with exp to other kids   Throat more sore  Head ahce and gunky  With coughing dev headache  Coughing up dry phhlegm   Review of Systems  HENT: Positive for sore throat.   Respiratory: Positive for cough and wheezing.   Neurological: Positive for headaches.       Objective:   Physical Exam  Alert active good hydration H&T moderate nasal congestion discharge pharynx normal neck supple. Lungs clear. Heart regular in rhythm.      Assessment & Plan:  Impression acute rhinosinusitis post viral with element of bronchitis plan antibiotics prescribed. Symptom care discussed. Warning signs discussed WSL

## 2015-04-11 ENCOUNTER — Ambulatory Visit (INDEPENDENT_AMBULATORY_CARE_PROVIDER_SITE_OTHER): Payer: 59 | Admitting: Psychiatry

## 2015-04-11 DIAGNOSIS — F988 Other specified behavioral and emotional disorders with onset usually occurring in childhood and adolescence: Secondary | ICD-10-CM

## 2015-04-11 DIAGNOSIS — F32A Depression, unspecified: Secondary | ICD-10-CM

## 2015-04-11 DIAGNOSIS — F329 Major depressive disorder, single episode, unspecified: Secondary | ICD-10-CM

## 2015-04-11 DIAGNOSIS — F909 Attention-deficit hyperactivity disorder, unspecified type: Secondary | ICD-10-CM | POA: Diagnosis not present
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
## 2015-04-11 NOTE — Patient Instructions (Signed)
Discussed orally 

## 2015-04-19 ENCOUNTER — Ambulatory Visit (HOSPITAL_COMMUNITY): Payer: Self-pay | Admitting: Psychiatry

## 2015-04-25 ENCOUNTER — Ambulatory Visit (HOSPITAL_COMMUNITY): Payer: Self-pay | Admitting: Psychiatry

## 2015-04-25 ENCOUNTER — Telehealth (HOSPITAL_COMMUNITY): Payer: Self-pay | Admitting: *Deleted

## 2015-05-02 ENCOUNTER — Telehealth (HOSPITAL_COMMUNITY): Payer: Self-pay | Admitting: *Deleted

## 2015-05-03 ENCOUNTER — Ambulatory Visit (HOSPITAL_COMMUNITY): Payer: 59 | Admitting: Psychiatry

## 2015-05-09 ENCOUNTER — Ambulatory Visit (HOSPITAL_COMMUNITY): Payer: Self-pay | Admitting: Psychiatry

## 2015-05-10 ENCOUNTER — Ambulatory Visit (INDEPENDENT_AMBULATORY_CARE_PROVIDER_SITE_OTHER): Payer: 59 | Admitting: Psychiatry

## 2015-05-10 DIAGNOSIS — F909 Attention-deficit hyperactivity disorder, unspecified type: Secondary | ICD-10-CM

## 2015-05-10 DIAGNOSIS — F32A Depression, unspecified: Secondary | ICD-10-CM

## 2015-05-10 DIAGNOSIS — F988 Other specified behavioral and emotional disorders with onset usually occurring in childhood and adolescence: Secondary | ICD-10-CM

## 2015-05-10 DIAGNOSIS — F329 Major depressive disorder, single episode, unspecified: Secondary | ICD-10-CM | POA: Diagnosis not present
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
## 2015-05-10 NOTE — Patient Instructions (Signed)
Discussed orally 

## 2015-05-15 ENCOUNTER — Ambulatory Visit (HOSPITAL_COMMUNITY): Payer: Self-pay | Admitting: Psychiatry

## 2015-05-16 ENCOUNTER — Ambulatory Visit (INDEPENDENT_AMBULATORY_CARE_PROVIDER_SITE_OTHER): Payer: 59 | Admitting: Psychiatry

## 2015-05-16 ENCOUNTER — Encounter (HOSPITAL_COMMUNITY): Payer: Self-pay | Admitting: Psychiatry

## 2015-05-16 VITALS — BP 126/83 | HR 73 | Ht 65.0 in | Wt 191.0 lb

## 2015-05-16 DIAGNOSIS — F53 Puerperal psychosis: Secondary | ICD-10-CM | POA: Diagnosis not present

## 2015-05-16 DIAGNOSIS — F909 Attention-deficit hyperactivity disorder, unspecified type: Secondary | ICD-10-CM

## 2015-05-16 DIAGNOSIS — F988 Other specified behavioral and emotional disorders with onset usually occurring in childhood and adolescence: Secondary | ICD-10-CM

## 2015-05-16 MED ORDER — METHYLPHENIDATE HCL ER (OSM) 18 MG PO TBCR: 18.0000 mg | EXTENDED_RELEASE_TABLET | Freq: Every day | ORAL | Status: DC
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
## 2015-05-24 ENCOUNTER — Ambulatory Visit (INDEPENDENT_AMBULATORY_CARE_PROVIDER_SITE_OTHER): Payer: 59 | Admitting: Psychiatry

## 2015-05-24 DIAGNOSIS — F32A Depression, unspecified: Secondary | ICD-10-CM

## 2015-05-24 DIAGNOSIS — F909 Attention-deficit hyperactivity disorder, unspecified type: Secondary | ICD-10-CM

## 2015-05-24 DIAGNOSIS — F988 Other specified behavioral and emotional disorders with onset usually occurring in childhood and adolescence: Secondary | ICD-10-CM

## 2015-05-24 DIAGNOSIS — F329 Major depressive disorder, single episode, unspecified: Secondary | ICD-10-CM
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
## 2015-05-24 NOTE — Patient Instructions (Signed)
Discussed orally 

## 2015-06-09 ENCOUNTER — Telehealth (HOSPITAL_COMMUNITY): Payer: Self-pay | Admitting: *Deleted

## 2015-06-09 NOTE — Telephone Encounter (Signed)
noted 

## 2015-06-09 NOTE — Telephone Encounter (Signed)
Received prior authorization for Concerta. Completed online with cover my meds. Awaiting response to be faxed.

## 2015-06-12 ENCOUNTER — Telehealth (HOSPITAL_COMMUNITY): Payer: Self-pay | Admitting: *Deleted

## 2015-06-13 ENCOUNTER — Ambulatory Visit (HOSPITAL_COMMUNITY): Payer: Self-pay | Admitting: Psychiatry

## 2015-06-14 ENCOUNTER — Encounter: Payer: Self-pay | Admitting: Obstetrics & Gynecology

## 2015-06-14 ENCOUNTER — Ambulatory Visit (INDEPENDENT_AMBULATORY_CARE_PROVIDER_SITE_OTHER): Payer: 59 | Admitting: Psychiatry

## 2015-06-14 ENCOUNTER — Ambulatory Visit (INDEPENDENT_AMBULATORY_CARE_PROVIDER_SITE_OTHER): Payer: 59 | Admitting: Obstetrics & Gynecology

## 2015-06-14 VITALS — BP 120/80 | HR 72 | Wt 189.0 lb

## 2015-06-14 DIAGNOSIS — F329 Major depressive disorder, single episode, unspecified: Secondary | ICD-10-CM | POA: Diagnosis not present

## 2015-06-14 DIAGNOSIS — F32A Depression, unspecified: Secondary | ICD-10-CM

## 2015-06-14 DIAGNOSIS — R5383 Other fatigue: Secondary | ICD-10-CM

## 2015-06-14 DIAGNOSIS — R635 Abnormal weight gain: Secondary | ICD-10-CM | POA: Diagnosis not present

## 2015-06-14 DIAGNOSIS — F988 Other specified behavioral and emotional disorders with onset usually occurring in childhood and adolescence: Secondary | ICD-10-CM

## 2015-06-14 DIAGNOSIS — F909 Attention-deficit hyperactivity disorder, unspecified type: Secondary | ICD-10-CM

## 2015-06-14 NOTE — Progress Notes (Signed)
Patient ID: Donna Kim, female   DOB: 11/14/1981, 33 y.o.   MRN: [id number]  Donna Kim presents with on going frustration with her inability to lose weight We talked at length about calorie counting, macronutrient ratios and timing of eating with intermittent fasting Also talked about exercise regimens and over all activity  She has diminished energy levels and fatigue  Will check her thyroid and also Hgb A1C  Given my fitness pal app suggestion and follow up in 4-6 weeks and see how things are going   Face to face time:  15 minutes  Greater than 50% of the visit time was spent in counseling and coordination of care with the patient.  The summary and outline of the counseling and care coordination is summarized in the note above.   All questions were answered.
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
## 2015-06-14 NOTE — Patient Instructions (Signed)
Discussed orally 

## 2015-06-15 LAB — HEMOGLOBIN A1C
ESTIMATED AVERAGE GLUCOSE: 108 mg/dL
Hgb A1c MFr Bld: 5.4 % (ref 4.8–5.6)

## 2015-06-15 LAB — TSH: TSH: 0.716 u[IU]/mL (ref 0.450–4.500)

## 2015-06-26 ENCOUNTER — Ambulatory Visit (INDEPENDENT_AMBULATORY_CARE_PROVIDER_SITE_OTHER): Payer: 59 | Admitting: Psychiatry

## 2015-06-26 DIAGNOSIS — F329 Major depressive disorder, single episode, unspecified: Secondary | ICD-10-CM

## 2015-06-26 DIAGNOSIS — F988 Other specified behavioral and emotional disorders with onset usually occurring in childhood and adolescence: Secondary | ICD-10-CM

## 2015-06-26 DIAGNOSIS — F909 Attention-deficit hyperactivity disorder, unspecified type: Secondary | ICD-10-CM | POA: Diagnosis not present

## 2015-06-26 DIAGNOSIS — F32A Depression, unspecified: Secondary | ICD-10-CM

## 2015-06-26 NOTE — Patient Instructions (Signed)
Discussed orally 
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
## 2015-07-10 ENCOUNTER — Ambulatory Visit (HOSPITAL_COMMUNITY): Payer: Self-pay | Admitting: Psychiatry

## 2015-07-10 ENCOUNTER — Ambulatory Visit (INDEPENDENT_AMBULATORY_CARE_PROVIDER_SITE_OTHER): Payer: 59 | Admitting: Psychiatry

## 2015-07-10 DIAGNOSIS — F329 Major depressive disorder, single episode, unspecified: Secondary | ICD-10-CM

## 2015-07-10 DIAGNOSIS — F988 Other specified behavioral and emotional disorders with onset usually occurring in childhood and adolescence: Secondary | ICD-10-CM

## 2015-07-10 DIAGNOSIS — F909 Attention-deficit hyperactivity disorder, unspecified type: Secondary | ICD-10-CM

## 2015-07-10 DIAGNOSIS — F32A Depression, unspecified: Secondary | ICD-10-CM
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
## 2015-07-11 NOTE — Patient Instructions (Signed)
Discussed orally 

## 2015-07-14 ENCOUNTER — Ambulatory Visit (HOSPITAL_COMMUNITY): Payer: Self-pay | Admitting: Psychiatry

## 2015-07-17 ENCOUNTER — Ambulatory Visit (HOSPITAL_COMMUNITY): Payer: Self-pay | Admitting: Psychiatry

## 2015-07-24 ENCOUNTER — Ambulatory Visit (INDEPENDENT_AMBULATORY_CARE_PROVIDER_SITE_OTHER): Payer: 59 | Admitting: Obstetrics & Gynecology

## 2015-07-24 ENCOUNTER — Encounter: Payer: Self-pay | Admitting: Obstetrics & Gynecology

## 2015-07-24 VITALS — BP 100/60 | HR 72 | Wt 192.0 lb

## 2015-07-24 DIAGNOSIS — R5383 Other fatigue: Secondary | ICD-10-CM | POA: Diagnosis not present

## 2015-07-24 DIAGNOSIS — R635 Abnormal weight gain: Secondary | ICD-10-CM | POA: Diagnosis not present

## 2015-07-25 ENCOUNTER — Ambulatory Visit (INDEPENDENT_AMBULATORY_CARE_PROVIDER_SITE_OTHER): Payer: 59 | Admitting: Psychiatry

## 2015-07-25 ENCOUNTER — Encounter (HOSPITAL_COMMUNITY): Payer: Self-pay | Admitting: Psychiatry

## 2015-07-25 DIAGNOSIS — F988 Other specified behavioral and emotional disorders with onset usually occurring in childhood and adolescence: Secondary | ICD-10-CM

## 2015-07-25 DIAGNOSIS — F329 Major depressive disorder, single episode, unspecified: Secondary | ICD-10-CM | POA: Diagnosis not present

## 2015-07-25 DIAGNOSIS — F909 Attention-deficit hyperactivity disorder, unspecified type: Secondary | ICD-10-CM | POA: Diagnosis not present

## 2015-07-25 DIAGNOSIS — F32A Depression, unspecified: Secondary | ICD-10-CM

## 2015-07-25 NOTE — Patient Instructions (Signed)
Discussed orally 
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
## 2015-08-05 ENCOUNTER — Emergency Department (HOSPITAL_COMMUNITY): Payer: 59

## 2015-08-05 ENCOUNTER — Encounter (HOSPITAL_COMMUNITY): Payer: Self-pay | Admitting: Emergency Medicine

## 2015-08-05 ENCOUNTER — Emergency Department (HOSPITAL_COMMUNITY)
Admission: EM | Admit: 2015-08-05 | Discharge: 2015-08-05 | Disposition: A | Discharge status: Home / Self Care | Payer: 59 | Attending: Emergency Medicine | Admitting: Emergency Medicine

## 2015-08-05 ENCOUNTER — Emergency Department (HOSPITAL_COMMUNITY)
Admission: EM | Admit: 2015-08-05 | Discharge: 2015-08-05 | Disposition: A | Discharge status: Home / Self Care | Payer: 59 | Source: Home / Self Care | Attending: Family Medicine | Admitting: Family Medicine

## 2015-08-05 DIAGNOSIS — G44309 Post-traumatic headache, unspecified, not intractable: Secondary | ICD-10-CM | POA: Diagnosis not present

## 2015-08-05 DIAGNOSIS — S0990XA Unspecified injury of head, initial encounter: Secondary | ICD-10-CM

## 2015-08-05 DIAGNOSIS — Z872 Personal history of diseases of the skin and subcutaneous tissue: Secondary | ICD-10-CM | POA: Diagnosis not present

## 2015-08-05 DIAGNOSIS — R42 Dizziness and giddiness: Secondary | ICD-10-CM | POA: Diagnosis not present

## 2015-08-05 DIAGNOSIS — Z79899 Other long term (current) drug therapy: Secondary | ICD-10-CM | POA: Insufficient documentation

## 2015-08-05 DIAGNOSIS — R5383 Other fatigue: Secondary | ICD-10-CM | POA: Diagnosis not present

## 2015-08-05 DIAGNOSIS — W1789XA Other fall from one level to another, initial encounter: Secondary | ICD-10-CM | POA: Insufficient documentation

## 2015-08-05 DIAGNOSIS — Y999 Unspecified external cause status: Secondary | ICD-10-CM | POA: Diagnosis not present

## 2015-08-05 DIAGNOSIS — Y929 Unspecified place or not applicable: Secondary | ICD-10-CM | POA: Diagnosis not present

## 2015-08-05 DIAGNOSIS — S8992XA Unspecified injury of left lower leg, initial encounter: Secondary | ICD-10-CM | POA: Diagnosis not present

## 2015-08-05 DIAGNOSIS — W1809XA Striking against other object with subsequent fall, initial encounter: Secondary | ICD-10-CM

## 2015-08-05 DIAGNOSIS — Y939 Activity, unspecified: Secondary | ICD-10-CM | POA: Diagnosis not present

## 2015-08-05 DIAGNOSIS — S199XXA Unspecified injury of neck, initial encounter: Secondary | ICD-10-CM | POA: Diagnosis not present

## 2015-08-05 DIAGNOSIS — R2689 Other abnormalities of gait and mobility: Secondary | ICD-10-CM

## 2015-08-05 NOTE — ED Notes (Signed)
Pt sent from urgent care for further eval of head injury onset Thursday afternoon. Pt reports that a chair was pulled out from under her and she fell and hit the back of head. Denies +LOC.

## 2015-08-05 NOTE — ED Notes (Signed)
Pt reports she sustained a head inj on Thursday  34 y/o daughter pulled chair under pt and pt fell back wards onto concrete flooring w/thin layer of carpet on top Sx have included dizziness, constant HA, fatigue, and feeling "irritable" Husband denies any abnormal behavior... He is at bedside Alert and oriented x4 ... No acute distress.

## 2015-08-05 NOTE — ED Provider Notes (Signed)
CSN: [id number]     Arrival date & time 08/05/15  1759 History   This chart was scribed for non-physician practitioner, Jeannett Senior, PA-C working with Leo Grosser, MD by Tula Nakayama, ED scribe. This patient was seen in room TR01C/TR01C and the patient's care was started at 6:37 PM   Chief Complaint  Patient presents with  . Head Injury   The history is provided by the patient. No language interpreter was used.   HPI Comments: Donna Kim is a 34 y.o. female who presents to the Emergency Department complaining of constant, mild, dull HA that started 2 days ago after she fell. Pt states mild, intermittent dizziness, photophobia and fatigue as associated symptoms. She also notes that she did not remember the event until this morning. Pt has tried Ibuprofen with no relief. Pt reports that she fell after her child accidentally pulled the chair from under her. She fell onto her buttocks and then hit the back of her head on the floor. Pt was seen by Urgent Care PTA for the same who referred her to the ED for further evaluation. She denies LOC, difficulty walking and blurred vision.  Past Medical History  Diagnosis Date  . No pertinent past medical history   . Back pain 11/12/2013  . Rosacea   . Postpartum bleeding 07/15/2014  . Contraceptive management 08/12/2014   Past Surgical History  Procedure Laterality Date  . Wisdom tooth extraction     Family History  Problem Relation Age of Onset  . Adopted: Yes   Social History  Substance Use Topics  . Smoking status: Never Smoker   . Smokeless tobacco: Never Used  . Alcohol Use: Yes     Comment: occasionally wine   OB History    Gravida Para Term Preterm AB TAB SAB Ectopic Multiple Living   2 2 2       2      Review of Systems  Constitutional: Positive for fatigue.  Eyes: Positive for photophobia. Negative for visual disturbance.  Musculoskeletal: Negative for gait problem.  Neurological: Positive for dizziness and headaches.     Allergies  Review of patient's allergies indicates no known allergies.  Home Medications   Prior to Admission medications   Medication Sig Start Date End Date Taking? Authorizing Provider  amoxicillin (AMOXIL) 500 MG capsule Take 1 capsule (500 mg total) by mouth 3 (three) times daily. Patient not taking: Reported on 06/14/2015 03/27/15   Mikey Kirschner, MD  Cholecalciferol (VITAMIN D PO) Take by mouth as needed.     Historical Provider, MD  clonazePAM (KLONOPIN) 0.5 MG tablet Take 1 tablet (0.5 mg total) by mouth daily as needed for anxiety. Patient not taking: Reported on 07/24/2015 03/22/15 03/21/16  Cloria Spring, MD  methylphenidate (CONCERTA) 18 MG PO CR tablet Take 1 tablet (18 mg total) by mouth daily. Patient not taking: Reported on 07/24/2015 05/16/15 05/15/16  Cloria Spring, MD  NON FORMULARY Taking monolaurin QD po    Historical Provider, MD  Omega-3 Fatty Acids (FISH OIL PO) Take by mouth daily.    Historical Provider, MD  Probiotic Product (PROBIOTIC DAILY PO) Take by mouth.    Historical Provider, MD   BP 131/82 mmHg  Pulse 69  Temp(Src) 98.3 F (36.8 C)  Resp 18  Ht 5\' 5"  (1.651 m)  Wt 190 lb (86.183 kg)  BMI 31.62 kg/m2  SpO2 100%  LMP 07/01/2015 Physical Exam  Constitutional: She is oriented to person, place, and time. She appears  well-developed and well-nourished. No distress.  HENT:  Head: Normocephalic and atraumatic.  Eyes: Conjunctivae and EOM are normal. Pupils are equal, round, and reactive to light.  Neck: Neck supple. No tracheal deviation present.  Midline cervical spine tenderness.  Cardiovascular: Normal rate.   Pulmonary/Chest: Effort normal. No respiratory distress.  Neurological: She is alert and oriented to person, place, and time.  5/5 and equal upper and lower extremity strength bilaterally. Equal grip strength bilaterally. Normal finger to nose. Pt unable to do heel to shin on left leg, normal right leg. Unable to stand with eyes closed. No  pronator drift. Patellar reflexes 2+   Skin: Skin is warm and dry.  Psychiatric: She has a normal mood and affect. Her behavior is normal.  Nursing note and vitals reviewed.   ED Course  Procedures   DIAGNOSTIC STUDIES: Oxygen Saturation is 100% on RA, normal by my interpretation.    COORDINATION OF CARE: 6:56 PM Discussed treatment plan with pt which includes CT Head and Neck. Pt agreed to plan.   Labs Review Labs Reviewed - No data to display  Imaging Review Ct Head Wo Contrast  08/05/2015   CLINICAL DATA:  Headache. Dizziness. [place] and hit head two days ago.  EXAM: CT HEAD WITHOUT CONTRAST  CT CERVICAL SPINE WITHOUT CONTRAST  TECHNIQUE: Multidetector CT imaging of the head and cervical spine was performed following the standard protocol without intravenous contrast. Multiplanar CT image reconstructions of the cervical spine were also generated.  COMPARISON:  None.  FINDINGS: CT HEAD FINDINGS  Ventricle size is normal. Negative for acute or chronic infarction. Negative for hemorrhage or fluid collection. Negative for mass or edema. No shift of the midline structures.  Calvarium is intact.  CT CERVICAL SPINE FINDINGS  Normal cervical alignment. Negative for fracture. No significant degenerative change. Spinal canal is adequate in size. No mass lesion.  IMPRESSION: Negative CT of the head and cervical spine.   Electronically Signed   By: Franchot Gallo M.D.   On: 08/05/2015 20:38   Ct Cervical Spine Wo Contrast  08/05/2015   CLINICAL DATA:  Headache. Dizziness. [place] and hit head two days ago.  EXAM: CT HEAD WITHOUT CONTRAST  CT CERVICAL SPINE WITHOUT CONTRAST  TECHNIQUE: Multidetector CT imaging of the head and cervical spine was performed following the standard protocol without intravenous contrast. Multiplanar CT image reconstructions of the cervical spine were also generated.  COMPARISON:  None.  FINDINGS: CT HEAD FINDINGS  Ventricle size is normal. Negative for acute or chronic infarction.  Negative for hemorrhage or fluid collection. Negative for mass or edema. No shift of the midline structures.  Calvarium is intact.  CT CERVICAL SPINE FINDINGS  Normal cervical alignment. Negative for fracture. No significant degenerative change. Spinal canal is adequate in size. No mass lesion.  IMPRESSION: Negative CT of the head and cervical spine.   Electronically Signed   By: Franchot Gallo M.D.   On: 08/05/2015 20:38     EKG Interpretation None      MDM   Final diagnoses:  Minor head injury, initial encounter    Patient from urgent care, head injury 2 days ago, having some dizziness, headache, coordination issues. On my exam, unable to to heal to shin on the left leg, apparently failed Romberg test at urgent care. Patient was sent here for CT of the head. Given neurological findings, will obtain CT of the head. Patient does have some cervical spine tenderness, will get CT of the cervical spine as  well.  CTs are negative. Patient is in no distress. She ate in emergency department, she is ambulatory, vital signs are normal. Stable for discharge home. Most likely mild concussion. Plan to follow-up with primary care doctor. Her gait is without ataxia, she is moving all extremities, strength is intact and equal.  Filed Vitals:   08/05/15 1808 08/05/15 2103  BP: 131/82 122/80  Pulse: 69 80  Temp: 98.3 F (36.8 C)   Resp: 18   Height: 5\' 5"  (1.651 m)   Weight: 190 lb (86.183 kg)   SpO2: 100% 100%    I personally performed the services described in this documentation, which was scribed in my presence. The recorded information has been reviewed and is accurate.   Jeannett Senior, PA-C 08/05/15 2108  Leo Grosser, MD 08/06/15 [phone]

## 2015-08-05 NOTE — ED Provider Notes (Signed)
CSN: [id number]     Arrival date & time 08/05/15  1607 History   First MD Initiated Contact with Patient 08/05/15 1652     Chief Complaint  Patient presents with  . Head Injury   (Consider location/radiation/quality/duration/timing/severity/associated sxs/prior Treatment) HPI Comments: 34 year old female was getting ready to sit down in her child pulled the seed out from under she fell backwards onto her back and struck the back of her head on the floor. She states she felt a little dizzy and shaken up at the time that she was able to get up and then sit back in the chair. She developed minor transient headache at the time self-limited. Yesterday she developed a headache that was primarily to the left hemicranium and occiput. She also has pain behind her eyes. It was mild and dull. In addition she feels as though her thinking is a little slower than usual and she is a bit more easily distracted. She took 2 ibuprofen and it abated the discomfort. She has been feeling dizzy fatigue, irritable. She has ADD and 2 children so her husband did not readily recognize any changes in her behavior however the patient states that she does feel differently and as previously described.  Patient is a 33 y.o. female presenting with head injury. The history is provided by the patient. No language interpreter was used.  Head Injury Location:  Generalized Time since incident:  2 days Mechanism of injury: direct blow   Pain details:    Quality:  Dull   Severity:  Mild   Duration:  1 day   Timing:  Intermittent   Progression:  Partially resolved Chronicity:  New Relieved by:  NSAIDs Associated symptoms: headache and neck pain   Associated symptoms: no blurred vision, no difficulty breathing, no disorientation, no double vision, no focal weakness, no hearing loss, no loss of consciousness, no memory loss, no nausea, no numbness, no seizures, no tinnitus and no vomiting   Risk factors: no alcohol use and no obesity      Past Medical History  Diagnosis Date  . No pertinent past medical history   . Back pain 11/12/2013  . Rosacea   . Postpartum bleeding 07/15/2014  . Contraceptive management 08/12/2014   Past Surgical History  Procedure Laterality Date  . Wisdom tooth extraction     Family History  Problem Relation Age of Onset  . Adopted: Yes   Social History  Substance Use Topics  . Smoking status: Never Smoker   . Smokeless tobacco: Never Used  . Alcohol Use: Yes     Comment: occasionally wine   OB History    Gravida Para Term Preterm AB TAB SAB Ectopic Multiple Living   2 2 2       2      Review of Systems  Constitutional: Positive for activity change and fatigue. Negative for fever.  HENT: Negative for congestion, dental problem, drooling, ear pain, hearing loss, nosebleeds, rhinorrhea, tinnitus and voice change.   Eyes: Positive for photophobia. Negative for blurred vision, double vision, pain, discharge and visual disturbance.  Respiratory: Negative for cough, choking, chest tightness and shortness of breath.   Cardiovascular: Negative for chest pain and leg swelling.  Gastrointestinal: Negative.  Negative for nausea and vomiting.  Genitourinary: Negative.   Musculoskeletal: Positive for neck pain. Negative for gait problem and neck stiffness.  Skin: Negative.  Negative for wound.  Neurological: Positive for dizziness and headaches. Negative for tremors, focal weakness, seizures, loss of consciousness, syncope, speech  difficulty and numbness.  Psychiatric/Behavioral: Positive for decreased concentration. Negative for hallucinations, memory loss and self-injury. The patient is hyperactive.   All other systems reviewed and are negative.   Allergies  Review of patient's allergies indicates no known allergies.  Home Medications   Prior to Admission medications   Medication Sig Start Date End Date Taking? Authorizing Provider  amoxicillin (AMOXIL) 500 MG capsule Take 1 capsule  (500 mg total) by mouth 3 (three) times daily. Patient not taking: Reported on 06/14/2015 03/27/15   Mikey Kirschner, MD  Cholecalciferol (VITAMIN D PO) Take by mouth as needed.     Historical Provider, MD  clonazePAM (KLONOPIN) 0.5 MG tablet Take 1 tablet (0.5 mg total) by mouth daily as needed for anxiety. Patient not taking: Reported on 07/24/2015 03/22/15 03/21/16  Cloria Spring, MD  methylphenidate (CONCERTA) 18 MG PO CR tablet Take 1 tablet (18 mg total) by mouth daily. Patient not taking: Reported on 07/24/2015 05/16/15 05/15/16  Cloria Spring, MD  NON FORMULARY Taking monolaurin QD po    Historical Provider, MD  Omega-3 Fatty Acids (FISH OIL PO) Take by mouth daily.    Historical Provider, MD  Probiotic Product (PROBIOTIC DAILY PO) Take by mouth.    Historical Provider, MD   Meds Ordered and Administered this Visit  Medications - No data to display  BP 125/80 mmHg  Pulse 60  Temp(Src) 98.5 F (36.9 C) (Oral)  Resp 18  SpO2 100%  LMP 07/01/2015 No data found.   Physical Exam  Constitutional: She is oriented to person, place, and time. She appears well-nourished. No distress.  HENT:  Mouth/Throat: No oropharyngeal exudate.  There is tenderness with a small hematoma to the left occiput area. No open areas or dermal injuries. Oropharynx clear and moist. No swelling or erythema. The tongue is midline. The uvula is midline. Swallowing reflex is normal. Tongue control is normal.  Eyes: Conjunctivae and EOM are normal. Pupils are equal, round, and reactive to light. Right eye exhibits no discharge. Left eye exhibits no discharge.  Photophobia experienced with eye exam.  Neck: Normal range of motion. Neck supple.  There is mild tenderness to the paracervical musculature. No spinal tenderness. No spinal deformity is palpable or observed. No overlying swelling or discoloration. Demonstrates full range of motion of the neck without pain.  Cardiovascular: Normal rate, regular rhythm, normal  heart sounds and intact distal pulses.   Pulmonary/Chest: Effort normal and breath sounds normal. No respiratory distress. She has no wheezes. She has no rales.  Abdominal: Soft. There is no tenderness.  Musculoskeletal: Normal range of motion. She exhibits no edema.  Strength to the upper and lower extremities are symmetric and 5 over 5. Range of motion is normal.  Lymphadenopathy:    She has no cervical adenopathy.  Neurological: She is alert and oriented to person, place, and time. She has normal strength. She displays no tremor. No cranial nerve deficit or sensory deficit. She exhibits normal muscle tone.  No dysmetria. Romberg is positive. She is unable to maintains station. Unable to perform heel-to-toe without falling out of line or grabbing onto an object to  h maintain her balance.  Skin: Skin is warm and dry. No rash noted. She is not diaphoretic. No erythema.  Psychiatric: She has a normal mood and affect.  Nursing note and vitals reviewed.   ED Course  Procedures (including critical care time)  Labs Review Labs Reviewed - No data to display  Imaging Review No results  found.   Visual Acuity Review  Right Eye Distance:   Left Eye Distance:   Bilateral Distance:    Right Eye Near:   Left Eye Near:    Bilateral Near:         MDM   1. Fall against object, initial encounter   2. Head injury, initial encounter   3. Post-traumatic headache, not intractable, unspecified chronicity pattern   4. Dizziness   5. Poor balance    Transferred to the emergency department for evaluation of head injury associated with symptoms concerning for concussion. May go by shuttle. Patient currently stable. She is with her husband.    Janne Napoleon, NP 08/05/15 1746

## 2015-08-05 NOTE — ED Notes (Signed)
Pt in CT at this time.
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
## 2015-08-08 ENCOUNTER — Ambulatory Visit (HOSPITAL_COMMUNITY): Payer: Self-pay | Admitting: Psychiatry

## 2015-08-09 ENCOUNTER — Ambulatory Visit (HOSPITAL_COMMUNITY): Payer: Self-pay | Admitting: Psychiatry

## 2015-08-23 ENCOUNTER — Ambulatory Visit (INDEPENDENT_AMBULATORY_CARE_PROVIDER_SITE_OTHER): Payer: 59 | Admitting: Psychiatry

## 2015-08-23 ENCOUNTER — Encounter (HOSPITAL_COMMUNITY): Payer: Self-pay | Admitting: Psychiatry

## 2015-08-23 DIAGNOSIS — F909 Attention-deficit hyperactivity disorder, unspecified type: Secondary | ICD-10-CM

## 2015-08-23 DIAGNOSIS — F329 Major depressive disorder, single episode, unspecified: Secondary | ICD-10-CM | POA: Diagnosis not present

## 2015-08-23 DIAGNOSIS — F988 Other specified behavioral and emotional disorders with onset usually occurring in childhood and adolescence: Secondary | ICD-10-CM

## 2015-08-23 DIAGNOSIS — F32A Depression, unspecified: Secondary | ICD-10-CM

## 2015-08-23 NOTE — Patient Instructions (Signed)
Discussed orally 
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
## 2015-09-08 ENCOUNTER — Telehealth: Payer: Self-pay | Admitting: Family Medicine

## 2015-09-08 DIAGNOSIS — M549 Dorsalgia, unspecified: Secondary | ICD-10-CM

## 2015-09-08 NOTE — Telephone Encounter (Signed)
Lets do refer, not sure about back dating will have to ask brendale

## 2015-09-08 NOTE — Telephone Encounter (Signed)
[facility] (need to know reasoning for chiropractor referral to put diagnoses in epic)

## 2015-09-08 NOTE — Telephone Encounter (Signed)
Spoke with patient and patient informed me that she is being seen at the chiropractor for back pain. Referral order in epic per Dr.Steve Luking.
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
## 2015-09-19 ENCOUNTER — Encounter (HOSPITAL_COMMUNITY): Payer: Self-pay | Admitting: Psychiatry

## 2015-09-19 ENCOUNTER — Ambulatory Visit (INDEPENDENT_AMBULATORY_CARE_PROVIDER_SITE_OTHER): Payer: 59 | Admitting: Psychiatry

## 2015-09-19 DIAGNOSIS — F909 Attention-deficit hyperactivity disorder, unspecified type: Secondary | ICD-10-CM

## 2015-09-19 DIAGNOSIS — F329 Major depressive disorder, single episode, unspecified: Secondary | ICD-10-CM

## 2015-09-19 DIAGNOSIS — F32A Depression, unspecified: Secondary | ICD-10-CM

## 2015-09-19 DIAGNOSIS — F988 Other specified behavioral and emotional disorders with onset usually occurring in childhood and adolescence: Secondary | ICD-10-CM

## 2015-09-19 NOTE — Patient Instructions (Signed)
Discussed orally 
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
## 2015-10-04 NOTE — Progress Notes (Signed)
Patient ID: Donna Kim, female   DOB: 04-18-1981, 34 y.o.   MRN: [id number] Chief Complaint  Patient presents with  . Follow-up    result of blood work.    Blood pressure 100/60, pulse 72, weight 192 lb (87.091 kg), last menstrual period 07/01/2015, not currently breastfeeding.  All labs are normal including thyroid Follow up on dietary guidelines and calorie issues  Recommended Dr Bethann Goo for stricter control     Face to face time:  10 minutes  Greater than 50% of the visit time was spent in counseling and coordination of care with the patient.  The summary and outline of the counseling and care coordination is summarized in the note above.   All questions were answered.

## 2015-10-05 ENCOUNTER — Ambulatory Visit (INDEPENDENT_AMBULATORY_CARE_PROVIDER_SITE_OTHER): Payer: 59 | Admitting: Psychiatry

## 2015-10-05 ENCOUNTER — Encounter (HOSPITAL_COMMUNITY): Payer: Self-pay | Admitting: Psychiatry

## 2015-10-05 DIAGNOSIS — F988 Other specified behavioral and emotional disorders with onset usually occurring in childhood and adolescence: Secondary | ICD-10-CM

## 2015-10-05 DIAGNOSIS — F329 Major depressive disorder, single episode, unspecified: Secondary | ICD-10-CM | POA: Diagnosis not present

## 2015-10-05 DIAGNOSIS — F909 Attention-deficit hyperactivity disorder, unspecified type: Secondary | ICD-10-CM

## 2015-10-05 DIAGNOSIS — F32A Depression, unspecified: Secondary | ICD-10-CM

## 2015-10-05 NOTE — Patient Instructions (Signed)
Discussed orally 
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
## 2015-10-16 ENCOUNTER — Telehealth (HOSPITAL_COMMUNITY): Payer: Self-pay | Admitting: *Deleted

## 2015-10-16 NOTE — Telephone Encounter (Signed)
lmtcb due to provider being out of office on Nov 16. Number provided.

## 2015-10-17 ENCOUNTER — Ambulatory Visit (HOSPITAL_COMMUNITY): Payer: Self-pay | Admitting: Psychiatry

## 2015-10-18 ENCOUNTER — Telehealth (HOSPITAL_COMMUNITY): Payer: Self-pay | Admitting: *Deleted

## 2015-10-18 ENCOUNTER — Ambulatory Visit (HOSPITAL_COMMUNITY): Payer: Self-pay | Admitting: Psychiatry

## 2015-10-31 ENCOUNTER — Ambulatory Visit (INDEPENDENT_AMBULATORY_CARE_PROVIDER_SITE_OTHER): Payer: 59 | Admitting: Psychiatry

## 2015-10-31 ENCOUNTER — Encounter (HOSPITAL_COMMUNITY): Payer: Self-pay | Admitting: Psychiatry

## 2015-10-31 DIAGNOSIS — F329 Major depressive disorder, single episode, unspecified: Secondary | ICD-10-CM

## 2015-10-31 DIAGNOSIS — F909 Attention-deficit hyperactivity disorder, unspecified type: Secondary | ICD-10-CM

## 2015-10-31 DIAGNOSIS — F988 Other specified behavioral and emotional disorders with onset usually occurring in childhood and adolescence: Secondary | ICD-10-CM

## 2015-10-31 DIAGNOSIS — F32A Depression, unspecified: Secondary | ICD-10-CM
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
## 2015-10-31 NOTE — Patient Instructions (Signed)
Discussed orally 

## 2015-11-14 ENCOUNTER — Ambulatory Visit (INDEPENDENT_AMBULATORY_CARE_PROVIDER_SITE_OTHER): Payer: 59 | Admitting: Psychiatry

## 2015-11-14 ENCOUNTER — Encounter (HOSPITAL_COMMUNITY): Payer: Self-pay | Admitting: Psychiatry

## 2015-11-14 DIAGNOSIS — F329 Major depressive disorder, single episode, unspecified: Secondary | ICD-10-CM | POA: Diagnosis not present

## 2015-11-14 DIAGNOSIS — F32A Depression, unspecified: Secondary | ICD-10-CM

## 2015-11-14 DIAGNOSIS — F988 Other specified behavioral and emotional disorders with onset usually occurring in childhood and adolescence: Secondary | ICD-10-CM

## 2015-11-14 DIAGNOSIS — F909 Attention-deficit hyperactivity disorder, unspecified type: Secondary | ICD-10-CM | POA: Diagnosis not present
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
## 2015-11-14 NOTE — Patient Instructions (Addendum)
Discussed orally 

## 2015-11-28 ENCOUNTER — Ambulatory Visit (HOSPITAL_COMMUNITY): Payer: Self-pay | Admitting: Psychiatry

## 2015-11-30 ENCOUNTER — Ambulatory Visit (INDEPENDENT_AMBULATORY_CARE_PROVIDER_SITE_OTHER): Payer: 59 | Admitting: Psychiatry

## 2015-11-30 ENCOUNTER — Encounter (HOSPITAL_COMMUNITY): Payer: Self-pay | Admitting: Psychiatry

## 2015-11-30 DIAGNOSIS — F329 Major depressive disorder, single episode, unspecified: Secondary | ICD-10-CM

## 2015-11-30 DIAGNOSIS — F988 Other specified behavioral and emotional disorders with onset usually occurring in childhood and adolescence: Secondary | ICD-10-CM

## 2015-11-30 DIAGNOSIS — F32A Depression, unspecified: Secondary | ICD-10-CM

## 2015-11-30 DIAGNOSIS — F909 Attention-deficit hyperactivity disorder, unspecified type: Secondary | ICD-10-CM | POA: Diagnosis not present

## 2015-11-30 NOTE — Patient Instructions (Signed)
Discussed orally 
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
## 2015-12-07 ENCOUNTER — Ambulatory Visit (HOSPITAL_COMMUNITY): Payer: Self-pay | Admitting: Psychiatry

## 2015-12-18 ENCOUNTER — Ambulatory Visit (INDEPENDENT_AMBULATORY_CARE_PROVIDER_SITE_OTHER): Payer: BLUE CROSS/BLUE SHIELD | Admitting: Psychiatry

## 2015-12-18 ENCOUNTER — Encounter (HOSPITAL_COMMUNITY): Payer: Self-pay | Admitting: Psychiatry

## 2015-12-18 DIAGNOSIS — F988 Other specified behavioral and emotional disorders with onset usually occurring in childhood and adolescence: Secondary | ICD-10-CM

## 2015-12-18 DIAGNOSIS — F32A Depression, unspecified: Secondary | ICD-10-CM

## 2015-12-18 DIAGNOSIS — F909 Attention-deficit hyperactivity disorder, unspecified type: Secondary | ICD-10-CM

## 2015-12-18 DIAGNOSIS — F329 Major depressive disorder, single episode, unspecified: Secondary | ICD-10-CM | POA: Diagnosis not present

## 2015-12-18 NOTE — Patient Instructions (Signed)
Discussed orally 
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
## 2015-12-26 ENCOUNTER — Ambulatory Visit (INDEPENDENT_AMBULATORY_CARE_PROVIDER_SITE_OTHER): Payer: BLUE CROSS/BLUE SHIELD | Admitting: Psychiatry

## 2015-12-26 ENCOUNTER — Encounter (HOSPITAL_COMMUNITY): Payer: Self-pay | Admitting: Psychiatry

## 2015-12-26 VITALS — BP 118/92 | HR 88 | Ht 65.0 in | Wt 198.2 lb

## 2015-12-26 DIAGNOSIS — F900 Attention-deficit hyperactivity disorder, predominantly inattentive type: Secondary | ICD-10-CM | POA: Diagnosis not present

## 2015-12-26 DIAGNOSIS — F329 Major depressive disorder, single episode, unspecified: Secondary | ICD-10-CM | POA: Diagnosis not present

## 2015-12-26 DIAGNOSIS — F32A Depression, unspecified: Secondary | ICD-10-CM

## 2015-12-26 DIAGNOSIS — F988 Other specified behavioral and emotional disorders with onset usually occurring in childhood and adolescence: Secondary | ICD-10-CM

## 2015-12-26 MED ORDER — METHYLPHENIDATE HCL ER (OSM) 36 MG PO TBCR: 36.0000 mg | EXTENDED_RELEASE_TABLET | Freq: Every day | ORAL | Status: DC
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
## 2016-01-03 ENCOUNTER — Encounter (HOSPITAL_COMMUNITY): Payer: Self-pay | Admitting: Psychiatry

## 2016-01-03 ENCOUNTER — Ambulatory Visit (INDEPENDENT_AMBULATORY_CARE_PROVIDER_SITE_OTHER): Payer: BLUE CROSS/BLUE SHIELD | Admitting: Psychiatry

## 2016-01-03 DIAGNOSIS — F988 Other specified behavioral and emotional disorders with onset usually occurring in childhood and adolescence: Secondary | ICD-10-CM

## 2016-01-03 DIAGNOSIS — F329 Major depressive disorder, single episode, unspecified: Secondary | ICD-10-CM

## 2016-01-03 DIAGNOSIS — F909 Attention-deficit hyperactivity disorder, unspecified type: Secondary | ICD-10-CM | POA: Diagnosis not present

## 2016-01-03 DIAGNOSIS — F32A Depression, unspecified: Secondary | ICD-10-CM

## 2016-01-03 NOTE — Patient Instructions (Signed)
Discussed orally 
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
## 2016-01-15 ENCOUNTER — Ambulatory Visit (INDEPENDENT_AMBULATORY_CARE_PROVIDER_SITE_OTHER): Payer: BLUE CROSS/BLUE SHIELD | Admitting: Psychiatry

## 2016-01-15 ENCOUNTER — Encounter (HOSPITAL_COMMUNITY): Payer: Self-pay | Admitting: Psychiatry

## 2016-01-15 DIAGNOSIS — F329 Major depressive disorder, single episode, unspecified: Secondary | ICD-10-CM | POA: Diagnosis not present

## 2016-01-15 DIAGNOSIS — F909 Attention-deficit hyperactivity disorder, unspecified type: Secondary | ICD-10-CM | POA: Diagnosis not present

## 2016-01-15 DIAGNOSIS — F988 Other specified behavioral and emotional disorders with onset usually occurring in childhood and adolescence: Secondary | ICD-10-CM

## 2016-01-15 DIAGNOSIS — F32A Depression, unspecified: Secondary | ICD-10-CM
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
## 2016-01-15 NOTE — Patient Instructions (Signed)
Discussed orally 

## 2016-01-22 ENCOUNTER — Ambulatory Visit (HOSPITAL_COMMUNITY): Payer: Self-pay | Admitting: Psychiatry

## 2016-01-23 ENCOUNTER — Ambulatory Visit (INDEPENDENT_AMBULATORY_CARE_PROVIDER_SITE_OTHER): Payer: BLUE CROSS/BLUE SHIELD | Admitting: Psychiatry

## 2016-01-23 ENCOUNTER — Encounter (HOSPITAL_COMMUNITY): Payer: Self-pay | Admitting: Psychiatry

## 2016-01-23 VITALS — BP 111/96 | HR 109 | Ht 65.0 in | Wt 195.8 lb

## 2016-01-23 DIAGNOSIS — F909 Attention-deficit hyperactivity disorder, unspecified type: Secondary | ICD-10-CM

## 2016-01-23 DIAGNOSIS — F53 Puerperal psychosis: Secondary | ICD-10-CM

## 2016-01-23 DIAGNOSIS — F988 Other specified behavioral and emotional disorders with onset usually occurring in childhood and adolescence: Secondary | ICD-10-CM

## 2016-01-23 MED ORDER — METHYLPHENIDATE HCL ER (OSM) 36 MG PO TBCR: 36.0000 mg | EXTENDED_RELEASE_TABLET | Freq: Every day | ORAL | Status: DC
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
## 2016-01-30 ENCOUNTER — Telehealth (HOSPITAL_COMMUNITY): Payer: Self-pay | Admitting: *Deleted

## 2016-01-30 NOTE — Telephone Encounter (Signed)
Called pt and lmtcb due to needing to resch appt from 02-01-16 due to provider being out of office. Number provided.

## 2016-01-31 ENCOUNTER — Encounter (HOSPITAL_COMMUNITY): Payer: Self-pay | Admitting: Psychiatry

## 2016-01-31 ENCOUNTER — Ambulatory Visit (INDEPENDENT_AMBULATORY_CARE_PROVIDER_SITE_OTHER): Payer: BLUE CROSS/BLUE SHIELD | Admitting: Psychiatry

## 2016-01-31 DIAGNOSIS — F329 Major depressive disorder, single episode, unspecified: Secondary | ICD-10-CM

## 2016-01-31 DIAGNOSIS — F909 Attention-deficit hyperactivity disorder, unspecified type: Secondary | ICD-10-CM | POA: Diagnosis not present

## 2016-01-31 DIAGNOSIS — F988 Other specified behavioral and emotional disorders with onset usually occurring in childhood and adolescence: Secondary | ICD-10-CM

## 2016-01-31 DIAGNOSIS — F32A Depression, unspecified: Secondary | ICD-10-CM

## 2016-01-31 NOTE — Patient Instructions (Signed)
Discussed orally 
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
## 2016-02-01 ENCOUNTER — Ambulatory Visit (HOSPITAL_COMMUNITY): Payer: Self-pay | Admitting: Psychiatry

## 2016-02-08 ENCOUNTER — Telehealth (HOSPITAL_COMMUNITY): Payer: Self-pay | Admitting: *Deleted

## 2016-02-13 ENCOUNTER — Ambulatory Visit (HOSPITAL_COMMUNITY): Payer: Self-pay | Admitting: Psychiatry

## 2016-03-05 ENCOUNTER — Encounter (HOSPITAL_COMMUNITY): Payer: Self-pay | Admitting: Psychiatry

## 2016-03-05 ENCOUNTER — Ambulatory Visit (INDEPENDENT_AMBULATORY_CARE_PROVIDER_SITE_OTHER): Payer: BLUE CROSS/BLUE SHIELD | Admitting: Psychiatry

## 2016-03-05 DIAGNOSIS — F329 Major depressive disorder, single episode, unspecified: Secondary | ICD-10-CM | POA: Diagnosis not present

## 2016-03-05 DIAGNOSIS — F909 Attention-deficit hyperactivity disorder, unspecified type: Secondary | ICD-10-CM | POA: Diagnosis not present

## 2016-03-05 DIAGNOSIS — F988 Other specified behavioral and emotional disorders with onset usually occurring in childhood and adolescence: Secondary | ICD-10-CM

## 2016-03-05 DIAGNOSIS — F32A Depression, unspecified: Secondary | ICD-10-CM
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
## 2016-03-05 NOTE — Patient Instructions (Signed)
Discussed orally 

## 2016-03-12 ENCOUNTER — Telehealth (HOSPITAL_COMMUNITY): Payer: Self-pay | Admitting: *Deleted

## 2016-03-12 ENCOUNTER — Other Ambulatory Visit (HOSPITAL_COMMUNITY): Payer: Self-pay | Admitting: Psychiatry

## 2016-03-12 MED ORDER — METHYLPHENIDATE HCL ER (OSM) 36 MG PO TBCR: 36.0000 mg | EXTENDED_RELEASE_TABLET | Freq: Every day | ORAL | Status: DC

## 2016-03-12 NOTE — Telephone Encounter (Signed)
Informed pt that printed script is ready for pick up

## 2016-03-12 NOTE — Telephone Encounter (Signed)
printed

## 2016-03-12 NOTE — Telephone Encounter (Signed)
Pt called stating she is very sorry for calling but the last time she came to see provider, provider gived her a printed script for Concerta. Per pt, she was recently cleaning her room, and she can not find what she did with the second script for your Concerta. Per pt, she leaves for a trip in [place] for about a week and a half. Per pt, she would like to know if Dr. Harrington Challenger could please approve another script for her to fill. Pt number is [phone].

## 2016-03-25 ENCOUNTER — Ambulatory Visit (INDEPENDENT_AMBULATORY_CARE_PROVIDER_SITE_OTHER): Payer: BLUE CROSS/BLUE SHIELD | Admitting: Psychiatry

## 2016-03-25 ENCOUNTER — Encounter (HOSPITAL_COMMUNITY): Payer: Self-pay | Admitting: Psychiatry

## 2016-03-25 DIAGNOSIS — F909 Attention-deficit hyperactivity disorder, unspecified type: Secondary | ICD-10-CM | POA: Diagnosis not present

## 2016-03-25 DIAGNOSIS — F329 Major depressive disorder, single episode, unspecified: Secondary | ICD-10-CM

## 2016-03-25 DIAGNOSIS — F988 Other specified behavioral and emotional disorders with onset usually occurring in childhood and adolescence: Secondary | ICD-10-CM

## 2016-03-25 DIAGNOSIS — F32A Depression, unspecified: Secondary | ICD-10-CM

## 2016-03-25 NOTE — Patient Instructions (Signed)
Discussed orally 
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
## 2016-03-26 ENCOUNTER — Ambulatory Visit (HOSPITAL_COMMUNITY): Payer: Self-pay | Admitting: Psychiatry

## 2016-04-17 ENCOUNTER — Telehealth (HOSPITAL_COMMUNITY): Payer: Self-pay | Admitting: *Deleted

## 2016-04-18 ENCOUNTER — Encounter (HOSPITAL_COMMUNITY): Payer: Self-pay | Admitting: Psychiatry

## 2016-04-18 ENCOUNTER — Ambulatory Visit (INDEPENDENT_AMBULATORY_CARE_PROVIDER_SITE_OTHER): Payer: BLUE CROSS/BLUE SHIELD | Admitting: Psychiatry

## 2016-04-18 VITALS — BP 108/83 | HR 93 | Ht 65.0 in | Wt 185.4 lb

## 2016-04-18 DIAGNOSIS — F32A Depression, unspecified: Secondary | ICD-10-CM

## 2016-04-18 DIAGNOSIS — F988 Other specified behavioral and emotional disorders with onset usually occurring in childhood and adolescence: Secondary | ICD-10-CM

## 2016-04-18 DIAGNOSIS — F909 Attention-deficit hyperactivity disorder, unspecified type: Secondary | ICD-10-CM

## 2016-04-18 DIAGNOSIS — F329 Major depressive disorder, single episode, unspecified: Secondary | ICD-10-CM | POA: Diagnosis not present

## 2016-04-18 MED ORDER — LISDEXAMFETAMINE DIMESYLATE 40 MG PO CAPS: 40.0000 mg | ORAL_CAPSULE | ORAL | Status: DC
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
## 2016-04-19 ENCOUNTER — Telehealth (HOSPITAL_COMMUNITY): Payer: Self-pay | Admitting: *Deleted

## 2016-04-19 NOTE — Telephone Encounter (Signed)
phone call from patient said [facility] sent prior auth for Vyvanse.

## 2016-04-19 NOTE — Telephone Encounter (Signed)
Spoke with pt and informed her of the Prior Auth process

## 2016-04-19 NOTE — Telephone Encounter (Signed)
Pt showed understanding. Will send request to [facility]

## 2016-04-22 ENCOUNTER — Telehealth (HOSPITAL_COMMUNITY): Payer: Self-pay | Admitting: *Deleted

## 2016-04-22 ENCOUNTER — Ambulatory Visit (HOSPITAL_COMMUNITY): Payer: Self-pay | Admitting: Psychiatry

## 2016-04-22 NOTE — Telephone Encounter (Signed)
Pt called to see if Dr. Harrington Challenger could approve for her to get samples for her Vyvanse while she waits for her Prior Auth to be completed. Pt number is [phone].

## 2016-04-23 ENCOUNTER — Telehealth (HOSPITAL_COMMUNITY): Payer: Self-pay | Admitting: *Deleted

## 2016-04-23 NOTE — Telephone Encounter (Signed)
No samples are given of controlled drugs

## 2016-04-23 NOTE — Telephone Encounter (Signed)
noted 

## 2016-04-23 NOTE — Telephone Encounter (Signed)
Prior authorization for Vyvanse receivied. Submitted online with cover my meds. Awaiting response.

## 2016-04-23 NOTE — Telephone Encounter (Signed)
Spoke with pt and informed her of what provider stated and she showed understanding. Also informed pt that prior auth was sent and pt showed understanding.

## 2016-05-03 DIAGNOSIS — M9902 Segmental and somatic dysfunction of thoracic region: Secondary | ICD-10-CM | POA: Diagnosis not present

## 2016-05-03 DIAGNOSIS — M546 Pain in thoracic spine: Secondary | ICD-10-CM | POA: Diagnosis not present

## 2016-05-03 DIAGNOSIS — M9901 Segmental and somatic dysfunction of cervical region: Secondary | ICD-10-CM | POA: Diagnosis not present

## 2016-05-03 DIAGNOSIS — M542 Cervicalgia: Secondary | ICD-10-CM | POA: Diagnosis not present

## 2016-05-08 DIAGNOSIS — J069 Acute upper respiratory infection, unspecified: Secondary | ICD-10-CM | POA: Diagnosis not present

## 2016-05-08 DIAGNOSIS — H6591 Unspecified nonsuppurative otitis media, right ear: Secondary | ICD-10-CM | POA: Diagnosis not present

## 2016-05-08 DIAGNOSIS — J029 Acute pharyngitis, unspecified: Secondary | ICD-10-CM | POA: Diagnosis not present

## 2016-05-08 DIAGNOSIS — H60501 Unspecified acute noninfective otitis externa, right ear: Secondary | ICD-10-CM | POA: Diagnosis not present

## 2016-05-09 ENCOUNTER — Encounter: Payer: Self-pay | Admitting: Family Medicine

## 2016-05-09 ENCOUNTER — Ambulatory Visit (INDEPENDENT_AMBULATORY_CARE_PROVIDER_SITE_OTHER): Payer: BLUE CROSS/BLUE SHIELD | Admitting: Family Medicine

## 2016-05-09 ENCOUNTER — Telehealth: Payer: Self-pay | Admitting: Family Medicine

## 2016-05-09 ENCOUNTER — Ambulatory Visit (INDEPENDENT_AMBULATORY_CARE_PROVIDER_SITE_OTHER): Payer: BLUE CROSS/BLUE SHIELD | Admitting: Psychiatry

## 2016-05-09 ENCOUNTER — Encounter (HOSPITAL_COMMUNITY): Payer: Self-pay | Admitting: Psychiatry

## 2016-05-09 VITALS — BP 112/80 | Temp 98.4°F | Ht 65.0 in | Wt 186.5 lb

## 2016-05-09 DIAGNOSIS — Z87820 Personal history of traumatic brain injury: Secondary | ICD-10-CM | POA: Diagnosis not present

## 2016-05-09 DIAGNOSIS — F909 Attention-deficit hyperactivity disorder, unspecified type: Secondary | ICD-10-CM | POA: Diagnosis not present

## 2016-05-09 DIAGNOSIS — H60391 Other infective otitis externa, right ear: Secondary | ICD-10-CM

## 2016-05-09 DIAGNOSIS — F329 Major depressive disorder, single episode, unspecified: Secondary | ICD-10-CM | POA: Diagnosis not present

## 2016-05-09 DIAGNOSIS — F32A Depression, unspecified: Secondary | ICD-10-CM

## 2016-05-09 DIAGNOSIS — F988 Other specified behavioral and emotional disorders with onset usually occurring in childhood and adolescence: Secondary | ICD-10-CM

## 2016-05-09 MED ORDER — CIPROFLOXACIN-DEXAMETHASONE 0.3-0.1 % OT SUSP: 4.0000 [drp] | Freq: Two times a day (BID) | OTIC | Status: DC

## 2016-05-09 MED ORDER — CEFPROZIL 500 MG PO TABS: 500.0000 mg | ORAL_TABLET | Freq: Two times a day (BID) | ORAL | Status: DC

## 2016-05-09 MED ORDER — HYDROCODONE-ACETAMINOPHEN 5-325 MG PO TABS: 1.0000 | ORAL_TABLET | ORAL | Status: DC | PRN

## 2016-05-09 NOTE — Telephone Encounter (Signed)
Pt calling today because she was seen by Urgent care yesterday and  Issued zpak for ear infection. The pain is so bad that Ibuprofen and Tylenol Is NOT touching the pain and issued a zpak and ear drops. The patient sounded like she  Was in a great deal of pain  Please advise   [facility]

## 2016-05-09 NOTE — Progress Notes (Signed)
   Subjective:    Patient ID: Donna Kim, female    DOB: 10-31-1981, 35 y.o.   MRN: [id number]  Otalgia  There is pain in the right ear. This is a new problem. The current episode started 1 to 4 weeks ago. There has been no fever. Associated symptoms include headaches and a sore throat. She has tried ear drops and NSAIDs (Zpak,) for the symptoms.   Patient states no other concerns this visit.   Review of Systems  HENT: Positive for ear pain and sore throat.   Neurological: Positive for headaches.  Patient wonders if this ear infection has anything to do with a concussion she had earlier this year or concussion she had several years ago she denies any other particular complaints denies dizziness nausea vomiting or double vision     Objective:   Physical Exam  Right otitis externa noted left TM normal throat is normal neck no masses lungs clear      Assessment & Plan:  Otitis externa drops indicated as well as antibiotics. Follow-up if problems warnings discuss pain medication prescribed just in case patient requests this caution drowsiness not to use when driving

## 2016-05-09 NOTE — Telephone Encounter (Signed)
Patient given z pack and ear drops for ear infection yesterday at urgent care. Patient taking advil for pain but still having severe ear pain.

## 2016-05-09 NOTE — Telephone Encounter (Signed)
This patient was treated when she walked in today.

## 2016-05-09 NOTE — Patient Instructions (Signed)
Discussed orally 
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
## 2016-05-10 ENCOUNTER — Emergency Department (HOSPITAL_COMMUNITY)
Admission: EM | Admit: 2016-05-10 | Discharge: 2016-05-10 | Disposition: A | Discharge status: Home / Self Care | Payer: BLUE CROSS/BLUE SHIELD | Attending: Emergency Medicine | Admitting: Emergency Medicine

## 2016-05-10 ENCOUNTER — Telehealth: Payer: Self-pay

## 2016-05-10 ENCOUNTER — Telehealth: Payer: Self-pay | Admitting: Family Medicine

## 2016-05-10 ENCOUNTER — Ambulatory Visit: Payer: Self-pay | Admitting: Family Medicine

## 2016-05-10 ENCOUNTER — Encounter (HOSPITAL_COMMUNITY): Payer: Self-pay

## 2016-05-10 DIAGNOSIS — H6501 Acute serous otitis media, right ear: Secondary | ICD-10-CM

## 2016-05-10 DIAGNOSIS — Z79899 Other long term (current) drug therapy: Secondary | ICD-10-CM | POA: Diagnosis not present

## 2016-05-10 DIAGNOSIS — H6091 Unspecified otitis externa, right ear: Secondary | ICD-10-CM | POA: Diagnosis not present

## 2016-05-10 DIAGNOSIS — Z7982 Long term (current) use of aspirin: Secondary | ICD-10-CM | POA: Diagnosis not present

## 2016-05-10 DIAGNOSIS — H9201 Otalgia, right ear: Secondary | ICD-10-CM | POA: Diagnosis not present

## 2016-05-10 MED ORDER — CLINDAMYCIN HCL 300 MG PO CAPS: 300.0000 mg | ORAL_CAPSULE | Freq: Three times a day (TID) | ORAL | Status: DC

## 2016-05-10 MED ORDER — CIPROFLOXACIN-DEXAMETHASONE 0.3-0.1 % OT SUSP
4.0000 [drp] | Freq: Once | OTIC | Status: AC
  Administered 2016-05-10: 4 [drp] via OTIC
  Filled 2016-05-10: qty 7.5

## 2016-05-10 MED ORDER — HYDROCODONE-ACETAMINOPHEN 5-325 MG PO TABS
2.0000 | ORAL_TABLET | Freq: Once | ORAL | Status: AC
  Administered 2016-05-10: 2 via ORAL
  Filled 2016-05-10: qty 2

## 2016-05-10 MED ORDER — TETRACAINE HCL 0.5 % OP SOLN
1.0000 [drp] | Freq: Once | OPHTHALMIC | Status: AC
  Administered 2016-05-10: 1 [drp] via OPHTHALMIC
  Filled 2016-05-10: qty 4

## 2016-05-10 MED ORDER — CIPROFLOXACIN-DEXAMETHASONE 0.3-0.1 % OT SUSP
OTIC | Status: AC
  Filled 2016-05-10: qty 7.5

## 2016-05-10 MED ORDER — OXYCODONE-ACETAMINOPHEN 5-325 MG PO TABS
1.0000 | ORAL_TABLET | ORAL | Status: DC | PRN
Start: 2016-05-10 — End: 2016-07-30

## 2016-05-10 NOTE — Telephone Encounter (Signed)
Patient called and stated that she went to the ER this am because her ear pain was very intense. She was told by the ER to increase her Vicodin to 2 tablets and take ibuprofen 800 mg prn. Patient states that she started itching and her throat feels "funny" since increasing the Vicodin. Per Dr. Nicki Reaper- stop the Vicodin, take some benadryl for the itching and take Percocet 5/325 mg #30 1 tab every 4 hours prn caution drowsiness. Patient verbalized understanding. Script ready for pickup.

## 2016-05-10 NOTE — Discharge Instructions (Signed)
You may take ibuprofen 800 mg every 8 hours as needed for pain. Please take this medication with food. You may take 1-2 Vicodin tablets every 4-6 hours as needed for pain. Please continue your Cefzil and Ciprodex as prescribed. You may use the numbing drops 1-2 drops in the right ear every 2-4 hours as needed for pain.   Otitis Externa Otitis externa is a bacterial or fungal infection of the outer ear canal. This is the area from the eardrum to the outside of the ear. Otitis externa is sometimes called "swimmer's ear." CAUSES  Possible causes of infection include:  Swimming in dirty water.  Moisture remaining in the ear after swimming or bathing.  Mild injury (trauma) to the ear.  Objects stuck in the ear (foreign body).  Cuts or scrapes (abrasions) on the outside of the ear. SIGNS AND SYMPTOMS  The first symptom of infection is often itching in the ear canal. Later signs and symptoms may include swelling and redness of the ear canal, ear pain, and yellowish-white fluid (pus) coming from the ear. The ear pain may be worse when pulling on the earlobe. DIAGNOSIS  Your health care provider will perform a physical exam. A sample of fluid may be taken from the ear and examined for bacteria or fungi. TREATMENT  Antibiotic ear drops are often given for 10 to 14 days. Treatment may also include pain medicine or corticosteroids to reduce itching and swelling. HOME CARE INSTRUCTIONS   Apply antibiotic ear drops to the ear canal as prescribed by your health care provider.  Take medicines only as directed by your health care provider.  If you have diabetes, follow any additional treatment instructions from your health care provider.  Keep all follow-up visits as directed by your health care provider. PREVENTION   Keep your ear dry. Use the corner of a towel to absorb water out of the ear canal after swimming or bathing.  Avoid scratching or putting objects inside your ear. This can damage the  ear canal or remove the protective wax that lines the canal. This makes it easier for bacteria and fungi to grow.  Avoid swimming in lakes, polluted water, or poorly chlorinated pools.  You may use ear drops made of rubbing alcohol and vinegar after swimming. Combine equal parts of white vinegar and alcohol in a bottle. Put 3 or 4 drops into each ear after swimming. SEEK MEDICAL CARE IF:   You have a fever.  Your ear is still red, swollen, painful, or draining pus after 3 days.  Your redness, swelling, or pain gets worse.  You have a severe headache.  You have redness, swelling, pain, or tenderness in the area behind your ear. MAKE SURE YOU:   Understand these instructions.  Will watch your condition.  Will get help right away if you are not doing well or get worse.   This information is not intended to replace advice given to you by your health care provider. Make sure you discuss any questions you have with your health care provider.   Document Released: 11/18/2005 Document Revised: 12/09/2014 Document Reviewed: 12/05/2011 Elsevier Interactive Patient Education 2016 [place].  Otitis Media With Effusion Otitis media with effusion is the presence of fluid in the middle ear. This is a common problem in children, which often follows ear infections. It may be present for weeks or longer after the infection. Unlike an acute ear infection, otitis media with effusion refers only to fluid behind the ear drum and not  infection. Children with repeated ear and sinus infections and allergy problems are the most likely to get otitis media with effusion. CAUSES  The most frequent cause of the fluid buildup is dysfunction of the eustachian tubes. These are the tubes that drain fluid in the ears to the back of the nose (nasopharynx). SYMPTOMS   The main symptom of this condition is hearing loss. As a result, you or your child may:  Listen to the TV at a loud volume.  Not respond to  questions.  Ask "what" often when spoken to.  Mistake or confuse one sound or word for another.  There may be a sensation of fullness or pressure but usually not pain. DIAGNOSIS   Your health care provider will diagnose this condition by examining you or your child's ears.  Your health care provider may test the pressure in you or your child's ear with a tympanometer.  A hearing test may be conducted if the problem persists. TREATMENT   Treatment depends on the duration and the effects of the effusion.  Antibiotics, decongestants, nose drops, and cortisone-type drugs (tablets or nasal spray) may not be helpful.  Children with persistent ear effusions may have delayed language or behavioral problems. Children at risk for developmental delays in hearing, learning, and speech may require referral to a specialist earlier than children not at risk.  You or your child's health care provider may suggest a referral to an ear, nose, and throat surgeon for treatment. The following may help restore normal hearing:  Drainage of fluid.  Placement of ear tubes (tympanostomy tubes).  Removal of adenoids (adenoidectomy). HOME CARE INSTRUCTIONS   Avoid secondhand smoke.  Infants who are breastfed are less likely to have this condition.  Avoid feeding infants while they are lying flat.  Avoid known environmental allergens.  Avoid people who are sick. SEEK MEDICAL CARE IF:   Hearing is not better in 3 months.  Hearing is worse.  Ear pain.  Drainage from the ear.  Dizziness. MAKE SURE YOU:   Understand these instructions.  Will watch your condition.  Will get help right away if you are not doing well or get worse.   This information is not intended to replace advice given to you by your health care provider. Make sure you discuss any questions you have with your health care provider.   Document Released: 12/26/2004 Document Revised: 12/09/2014 Document Reviewed:  06/15/2013 Elsevier Interactive Patient Education [employer].

## 2016-05-10 NOTE — Telephone Encounter (Signed)
Notified patient it would be fine to stop the Cefzil and try clindamycin 300 mg 1 3 times a day for the next 7 days. Med sent to pharmacy.

## 2016-05-10 NOTE — ED Notes (Signed)
Pt diagnosed yesterday with swimmers ear on the right.  Pt states the pain in the right ear has worsened and now with pain to her right jaw.

## 2016-05-10 NOTE — Telephone Encounter (Signed)
It appears to be difficult to predict regarding what is causing the issue it would be fine to stop the Cefzil and try clindamycin 300 mg 1 3 times a day for the next 7 days

## 2016-05-10 NOTE — Telephone Encounter (Signed)
Pt now feels like the Vicodin is not what's causing her to itch/nausea she thinks  It may be the Antibiotic and is asking that we switch the antibiotic  Please advise   Donna Kim

## 2016-05-10 NOTE — ED Provider Notes (Signed)
TIME SEEN: 1:50 AM  CHIEF COMPLAINT: Right ear pain  HPI: Pt is a 35 y.o. female with history of anxiety who presents to the emergency department with complaint of right ear pain. Pain is been present for the past week and worsened over the past several hours. Patient was seen at urgent care 3 days ago and was diagnosed with your infection. Was started on azithromycin and given eardrops. She cannot remember what these eardrops were but stated that they did not help with her pain. Saw her PCP Dr. Wolfgang [place] yesterday and was diagnosed with otitis externa. Was started on Cefzil and Ciprodex. States she's had 1 dose of the Cefzil but could not pick up the Ciprodex because she needed prior authorization with her insurance. States she was also given Vicodin which she is taking for pain half to 1 tablet every 4 hours. States she has some relief with this. Also using 800 mg of ibuprofen. Came in tonight because pain was uncontrolled and radiates into her jaw. No dental pain or facial swelling. No fever. States pain got worse after swimming 4 days ago. No headache, neck pain or neck stiffness. No cough. No vomiting or diarrhea. No rash.  ROS: See HPI Constitutional: no fever  Eyes: no drainage  ENT: no runny nose   Cardiovascular:  no chest pain  Resp: no SOB  GI: no vomiting GU: no dysuria Integumentary: no rash  Allergy: no hives  Musculoskeletal: no leg swelling  Neurological: no slurred speech ROS otherwise negative  PAST MEDICAL HISTORY/PAST SURGICAL HISTORY:  Past Medical History  Diagnosis Date  . No pertinent past medical history   . Back pain 11/12/2013  . Rosacea   . Postpartum bleeding 07/15/2014  . Contraceptive management 08/12/2014    MEDICATIONS:  Prior to Admission medications   Medication Sig Start Date End Date Taking? Authorizing Provider  cefPROZIL (CEFZIL) 500 MG tablet Take 1 tablet (500 mg total) by mouth 2 (two) times daily. X 10 days 05/09/16  Yes Kathyrn Drown, MD   HYDROcodone-acetaminophen (NORCO/VICODIN) 5-325 MG tablet Take 1 tablet by mouth every 4 (four) hours as needed. 05/09/16  Yes Kathyrn Drown, MD  Omega-3 Fatty Acids (FISH OIL PO) Take by mouth daily as needed. Reported on 05/09/2016   Yes Historical Provider, MD  Probiotic Product (PROBIOTIC DAILY PO) Take by mouth.   Yes Historical Provider, MD  aspirin-acetaminophen-caffeine (EXCEDRIN MIGRAINE) [phone] MG tablet Take 1 tablet by mouth every 6 (six) hours as needed for headache.    Historical Provider, MD  azithromycin (ZITHROMAX) 250 MG tablet  05/08/16   Historical Provider, MD  ciprofloxacin-dexamethasone (CIPRODEX) otic suspension Place 4 drops into the right ear 2 (two) times daily. 05/09/16   Kathyrn Drown, MD  clonazePAM (KLONOPIN) 0.5 MG tablet Take 0.5 mg by mouth as needed for anxiety.    Historical Provider, MD  lisdexamfetamine (VYVANSE) 40 MG capsule Take 1 capsule (40 mg total) by mouth every morning. Patient not taking: Reported on 05/09/2016 04/18/16   Cloria Spring, MD  loratadine (CLARITIN) 5 MG/5ML syrup Take by mouth daily. Reported on 05/09/2016    Historical Provider, MD  neomycin-polymyxin-hydrocortisone (CORTISPORIN) 3.5-10000-1 otic suspension  05/08/16   Historical Provider, MD  NON FORMULARY Reported on 05/09/2016    Historical Provider, MD    ALLERGIES:  No Known Allergies  SOCIAL HISTORY:  Social History  Substance Use Topics  . Smoking status: Never Smoker   . Smokeless tobacco: Never Used  . Alcohol Use:  Yes     Comment: occasionally wine    FAMILY HISTORY: Family History  Problem Relation Age of Onset  . Adopted: Yes    EXAM: BP 145/103 mmHg  Pulse 87  Temp(Src) 98.2 F (36.8 C) (Oral)  Resp 20  Ht 5\' 4"  (1.626 m)  Wt 186 lb (84.369 kg)  BMI 31.91 kg/m2  SpO2 100%  LMP 04/08/2016 CONSTITUTIONAL: Alert and oriented and responds appropriately to questions. Well-appearing; well-nourished, Appears uncomfortable, afebrile and nontoxic, appears very  anxious HEAD: Normocephalic EYES: Conjunctivae clear, PERRL ENT: normal nose; no rhinorrhea; moist mucous membranes; No pharyngeal erythema or petechiae, no tonsillar hypertrophy or exudate, no uvular deviation, no trismus or drooling, normal phonation, no stridor, no dental caries or abscess noted, no Ludwig's angina, tongue sits flat in the bottom of the mouth; left TM is clear without erythema, purulence, bulging, perforation, effusion.  No cerumen impaction or sign of foreign body in the external auditory canal. No inflammation, erythema or drainage from the external auditory canal on the left.  On the upright patient does have erythematous TM with bulging and effusion. No perforation. She also has inflammation and erythema of the right external auditory canal without purulent drainage or bleeding. No signs of mastoiditis bilaterally. No pain with manipulation of the pinna bilaterally. NECK: Supple, no meningismus, no LAD  CARD: RRR; S1 and S2 appreciated; no murmurs, no clicks, no rubs, no gallops RESP: Normal chest excursion without splinting or tachypnea; breath sounds clear and equal bilaterally; no wheezes, no rhonchi, no rales, no hypoxia or respiratory distress, speaking full sentences ABD/GI: Normal bowel sounds; non-distended; soft, non-tender, no rebound, no guarding, no peritoneal signs BACK:  The back appears normal and is non-tender to palpation, there is no CVA tenderness EXT: Normal ROM in all joints; non-tender to palpation; no edema; normal capillary refill; no cyanosis, no calf tenderness or swelling    SKIN: Normal color for age and race; warm; no rash NEURO: Moves all extremities equally, sensation to light touch intact diffusely, cranial nerves II through XII intact PSYCH: The patient's mood and manner are appropriate. Grooming and personal hygiene are appropriate.  MEDICAL DECISION MAKING: Patient here with what appears to be right otitis media and otitis externa. She is are  you on Cefzil. Unable to pick up her Ciprodex. We have provided her with a bottle of Ciprodex from the emergency department. Have also given her Vicodin for pain. She took ibuprofen prior to arrival. Have advised her to increase her Vicodin to 2 tablets every 4-6 hours as needed for pain. She was provided with 30 tablets by her PCP yesterday. I do not feel she needs any stronger narcotic pain medication at this time. Have advised her to continue ibuprofen 800 mg every 8 hours with food. We've also given her tetracaine drops to place in her ear. We have given her some of these drops in the emergency department with good relief of pain. No signs of meningitis, dental abscess, Ludwig's angina, deep space neck infection, peritonsillar abscess, mastoiditis on exam. She is well-appearing, nontoxic and appears well-hydrated. She does appear very anxious and I have spent a large amount of time reassuring patient and her mother. She has PCP follow-up. We have also given ENT follow-up information if symptoms are not improving in the next week. I do not feel she needs an ear wick placed that she does have swelling of her external auditory canal on the right but it is open and I feel that she will  be able to get the drops in her ear successfully. They're comfortable with this plan.   At this time, I do not feel there is any life-threatening condition present. I have reviewed and discussed all results (EKG, imaging, lab, urine as appropriate), exam findings with patient. I have reviewed nursing notes and appropriate previous records.  I feel the patient is safe to be discharged home without further emergent workup. Discussed usual and customary return precautions. Patient and family (if present) verbalize understanding and are comfortable with this plan.  Patient will follow-up with their primary care provider. If they do not have a primary care provider, information for follow-up has been provided to them. All questions have  been answered.        [place], DO 05/10/16 [phone]

## 2016-05-14 ENCOUNTER — Encounter: Payer: Self-pay | Admitting: Family Medicine

## 2016-05-14 ENCOUNTER — Telehealth: Payer: Self-pay | Admitting: Family Medicine

## 2016-05-14 ENCOUNTER — Ambulatory Visit (INDEPENDENT_AMBULATORY_CARE_PROVIDER_SITE_OTHER): Payer: BLUE CROSS/BLUE SHIELD | Admitting: Family Medicine

## 2016-05-14 VITALS — BP 120/76 | Temp 98.1°F | Ht 65.0 in | Wt 188.0 lb

## 2016-05-14 DIAGNOSIS — H6091 Unspecified otitis externa, right ear: Secondary | ICD-10-CM

## 2016-05-14 DIAGNOSIS — H60331 Swimmer's ear, right ear: Secondary | ICD-10-CM

## 2016-05-14 MED ORDER — CLINDAMYCIN HCL 300 MG PO CAPS: 300.0000 mg | ORAL_CAPSULE | Freq: Three times a day (TID) | ORAL | Status: DC

## 2016-05-14 MED ORDER — CIPROFLOXACIN-DEXAMETHASONE 0.3-0.1 % OT SUSP: 4.0000 [drp] | Freq: Two times a day (BID) | OTIC | Status: DC

## 2016-05-14 NOTE — Telephone Encounter (Signed)
Office visit today with dr Richardson Landry.

## 2016-05-14 NOTE — Telephone Encounter (Signed)
Pt is still having pressure in her ears and is wanting to know if that is normal of if she needs to be rechecked. Please advise.

## 2016-05-14 NOTE — Telephone Encounter (Signed)
Changed cefzil to clindamycin because of side effects. Nausea, vomiting and itching. Pt states yesterday she though ear was doing better but this am it is back to where it started. Ear pain had to take oxycodone, feels swollen, low grade fever, not able to hear, no drainage.

## 2016-05-14 NOTE — Telephone Encounter (Signed)
Pt notified and scheduled appt today.

## 2016-05-14 NOTE — Telephone Encounter (Signed)
[facility] - to get symptoms. Pt went to urgent care 6/7 for right otitis externa. Prescribed zpack and cortisporin drops. Seen next day here by dr Nicki Reaper. Changed med to cefzil and ciprofloxacin-dexamethasone drops. Then seen 6/9 at ED and prescribed oxycodone.

## 2016-05-14 NOTE — Progress Notes (Signed)
   Subjective:    Patient ID: Donna Kim, female    DOB: 07/30/1981, 35 y.o.   MRN: [id number]  Otalgia  There is pain in the right ear. This is a new problem. The current episode started 1 to 4 weeks ago. The problem has been unchanged. There has been no fever. Associated symptoms include headaches and a sore throat. Associated symptoms comments: vomiting. She has tried antibiotics and ear drops for the symptoms. The treatment provided no relief.   Patient has been seen at our office, [place] and [facility] for this issue within the past week.   Review of Systems  HENT: Positive for ear pain and sore throat.   Neurological: Positive for headaches.   Ear full and pinful and trouble hearing and difficulty hearing   Congestion and stuffy and congested in sinus area  Noting tinnitus in left ear  Pos sig recent swimming     Objective:   Physical Exam   alert vitals stable external ear canal inflamed eardrum crusty left TM nomal phrynx normal lungs clear heart rate and rhythm. Substantial anxiety freefloating and present      Assessment & Plan:  simpression external otitis. This is her fourth visit to heathcare professional for this at 3 different locations. pla manai cindamycin.maintain Ciproex. Prescription written. ENT referral. At least a dozen questions answered WSL

## 2016-05-15 ENCOUNTER — Telehealth: Payer: Self-pay | Admitting: Family Medicine

## 2016-05-15 NOTE — Telephone Encounter (Signed)
Called patient and informed her that medication was approved and it may take up to 1 hour before information is updated and patient's pharmacy. Patient verbalized understanding.

## 2016-05-15 NOTE — Telephone Encounter (Signed)
ciprofloxacin-dexamethasone (CIPRODEX) otic suspension  This script was denied last week on the 9th, was resent on the 13th  But they will not be taking that or even see it until we process the original  Denial from the 9th  You can contact them at [phone] ext [phone]  Case ref # [id number]  Was supposed to have taken cortisporin (did get this from urgent care) Need to show she did try that and didn't work

## 2016-05-15 NOTE — Telephone Encounter (Signed)
Called and did a peer to peer with patient's insurance. Due to patient trying and failing the Cortisporin the Ciprodex otic suspension was approved. Insurance states it may take up to one hour before this is approved at patient's pharmacy but they are sending over the information to patient's pharmacy.

## 2016-05-16 ENCOUNTER — Ambulatory Visit (INDEPENDENT_AMBULATORY_CARE_PROVIDER_SITE_OTHER): Payer: BLUE CROSS/BLUE SHIELD | Admitting: Otolaryngology

## 2016-05-16 DIAGNOSIS — H60331 Swimmer's ear, right ear: Secondary | ICD-10-CM | POA: Diagnosis not present

## 2016-05-20 ENCOUNTER — Ambulatory Visit (HOSPITAL_COMMUNITY): Payer: Self-pay | Admitting: Psychiatry

## 2016-05-21 ENCOUNTER — Telehealth: Payer: Self-pay | Admitting: *Deleted

## 2016-05-21 NOTE — Telephone Encounter (Signed)
Ciprodex approved-Reference number [id number]- Authorized 05/10/16-12/01/38. Patient and pharmacy notified(see previous telephone message)

## 2016-05-23 ENCOUNTER — Ambulatory Visit (INDEPENDENT_AMBULATORY_CARE_PROVIDER_SITE_OTHER): Payer: BLUE CROSS/BLUE SHIELD | Admitting: Otolaryngology

## 2016-05-23 DIAGNOSIS — H60331 Swimmer's ear, right ear: Secondary | ICD-10-CM | POA: Diagnosis not present

## 2016-05-23 DIAGNOSIS — H9311 Tinnitus, right ear: Secondary | ICD-10-CM | POA: Diagnosis not present

## 2016-05-24 ENCOUNTER — Ambulatory Visit (HOSPITAL_COMMUNITY): Payer: Self-pay | Admitting: Psychiatry

## 2016-05-24 ENCOUNTER — Other Ambulatory Visit (INDEPENDENT_AMBULATORY_CARE_PROVIDER_SITE_OTHER): Payer: Self-pay | Admitting: Otolaryngology

## 2016-05-24 DIAGNOSIS — H9311 Tinnitus, right ear: Secondary | ICD-10-CM

## 2016-05-28 ENCOUNTER — Encounter (HOSPITAL_COMMUNITY): Payer: Self-pay | Admitting: Psychiatry

## 2016-05-28 ENCOUNTER — Ambulatory Visit (INDEPENDENT_AMBULATORY_CARE_PROVIDER_SITE_OTHER): Payer: BLUE CROSS/BLUE SHIELD | Admitting: Psychiatry

## 2016-05-28 DIAGNOSIS — F909 Attention-deficit hyperactivity disorder, unspecified type: Secondary | ICD-10-CM

## 2016-05-28 DIAGNOSIS — F32A Depression, unspecified: Secondary | ICD-10-CM

## 2016-05-28 DIAGNOSIS — F988 Other specified behavioral and emotional disorders with onset usually occurring in childhood and adolescence: Secondary | ICD-10-CM

## 2016-05-28 DIAGNOSIS — F329 Major depressive disorder, single episode, unspecified: Secondary | ICD-10-CM | POA: Diagnosis not present

## 2016-05-28 NOTE — Patient Instructions (Signed)
Discussed orally 
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
## 2016-05-31 ENCOUNTER — Ambulatory Visit (HOSPITAL_COMMUNITY): Payer: BLUE CROSS/BLUE SHIELD

## 2016-06-05 ENCOUNTER — Ambulatory Visit (HOSPITAL_COMMUNITY)
Admission: RE | Admit: 2016-06-05 | Discharge: 2016-06-05 | Disposition: A | Discharge status: Home / Self Care | Payer: BLUE CROSS/BLUE SHIELD | Source: Ambulatory Visit | Attending: Otolaryngology | Admitting: Otolaryngology

## 2016-06-05 DIAGNOSIS — H9311 Tinnitus, right ear: Secondary | ICD-10-CM | POA: Diagnosis not present

## 2016-06-05 DIAGNOSIS — H93A1 Pulsatile tinnitus, right ear: Secondary | ICD-10-CM | POA: Diagnosis not present

## 2016-06-05 DIAGNOSIS — R9082 White matter disease, unspecified: Secondary | ICD-10-CM | POA: Insufficient documentation

## 2016-06-05 MED ORDER — GADOBENATE DIMEGLUMINE 529 MG/ML IV SOLN
15.0000 mL | Freq: Once | INTRAVENOUS | Status: AC | PRN
  Administered 2016-06-05: 15 mL via INTRAVENOUS

## 2016-06-11 ENCOUNTER — Ambulatory Visit (INDEPENDENT_AMBULATORY_CARE_PROVIDER_SITE_OTHER): Payer: BLUE CROSS/BLUE SHIELD | Admitting: Psychiatry

## 2016-06-11 ENCOUNTER — Encounter (HOSPITAL_COMMUNITY): Payer: Self-pay | Admitting: Psychiatry

## 2016-06-11 DIAGNOSIS — F329 Major depressive disorder, single episode, unspecified: Secondary | ICD-10-CM

## 2016-06-11 DIAGNOSIS — F988 Other specified behavioral and emotional disorders with onset usually occurring in childhood and adolescence: Secondary | ICD-10-CM

## 2016-06-11 DIAGNOSIS — F909 Attention-deficit hyperactivity disorder, unspecified type: Secondary | ICD-10-CM | POA: Diagnosis not present

## 2016-06-11 DIAGNOSIS — F32A Depression, unspecified: Secondary | ICD-10-CM

## 2016-06-11 NOTE — Patient Instructions (Signed)
Discussed orally 
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
## 2016-06-12 ENCOUNTER — Ambulatory Visit (HOSPITAL_COMMUNITY): Payer: BLUE CROSS/BLUE SHIELD

## 2016-06-12 ENCOUNTER — Other Ambulatory Visit (HOSPITAL_COMMUNITY): Payer: BLUE CROSS/BLUE SHIELD

## 2016-06-25 ENCOUNTER — Encounter (HOSPITAL_COMMUNITY): Payer: Self-pay | Admitting: Psychiatry

## 2016-06-25 ENCOUNTER — Ambulatory Visit (INDEPENDENT_AMBULATORY_CARE_PROVIDER_SITE_OTHER): Payer: BLUE CROSS/BLUE SHIELD | Admitting: Psychiatry

## 2016-06-25 DIAGNOSIS — F329 Major depressive disorder, single episode, unspecified: Secondary | ICD-10-CM | POA: Diagnosis not present

## 2016-06-25 DIAGNOSIS — F909 Attention-deficit hyperactivity disorder, unspecified type: Secondary | ICD-10-CM | POA: Diagnosis not present

## 2016-06-25 DIAGNOSIS — F988 Other specified behavioral and emotional disorders with onset usually occurring in childhood and adolescence: Secondary | ICD-10-CM

## 2016-06-25 DIAGNOSIS — F32A Depression, unspecified: Secondary | ICD-10-CM
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
## 2016-07-01 ENCOUNTER — Ambulatory Visit (INDEPENDENT_AMBULATORY_CARE_PROVIDER_SITE_OTHER): Payer: BLUE CROSS/BLUE SHIELD | Admitting: Psychiatry

## 2016-07-01 ENCOUNTER — Encounter (HOSPITAL_COMMUNITY): Payer: Self-pay | Admitting: Psychiatry

## 2016-07-01 DIAGNOSIS — F909 Attention-deficit hyperactivity disorder, unspecified type: Secondary | ICD-10-CM

## 2016-07-01 DIAGNOSIS — F329 Major depressive disorder, single episode, unspecified: Secondary | ICD-10-CM

## 2016-07-01 DIAGNOSIS — F32A Depression, unspecified: Secondary | ICD-10-CM

## 2016-07-01 DIAGNOSIS — F988 Other specified behavioral and emotional disorders with onset usually occurring in childhood and adolescence: Secondary | ICD-10-CM
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
## 2016-07-09 ENCOUNTER — Encounter (HOSPITAL_COMMUNITY): Payer: Self-pay | Admitting: Psychiatry

## 2016-07-09 ENCOUNTER — Ambulatory Visit (INDEPENDENT_AMBULATORY_CARE_PROVIDER_SITE_OTHER): Payer: BLUE CROSS/BLUE SHIELD | Admitting: Psychiatry

## 2016-07-09 DIAGNOSIS — F909 Attention-deficit hyperactivity disorder, unspecified type: Secondary | ICD-10-CM

## 2016-07-09 DIAGNOSIS — F32A Depression, unspecified: Secondary | ICD-10-CM

## 2016-07-09 DIAGNOSIS — F329 Major depressive disorder, single episode, unspecified: Secondary | ICD-10-CM

## 2016-07-09 DIAGNOSIS — F988 Other specified behavioral and emotional disorders with onset usually occurring in childhood and adolescence: Secondary | ICD-10-CM
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
## 2016-07-23 ENCOUNTER — Ambulatory Visit (HOSPITAL_COMMUNITY): Payer: Self-pay | Admitting: Psychiatry

## 2016-07-30 ENCOUNTER — Encounter: Payer: Self-pay | Admitting: Family Medicine

## 2016-07-30 ENCOUNTER — Ambulatory Visit (INDEPENDENT_AMBULATORY_CARE_PROVIDER_SITE_OTHER): Payer: BLUE CROSS/BLUE SHIELD | Admitting: Family Medicine

## 2016-07-30 VITALS — BP 122/80 | Ht 64.5 in | Wt 190.0 lb

## 2016-07-30 DIAGNOSIS — J329 Chronic sinusitis, unspecified: Secondary | ICD-10-CM

## 2016-07-30 DIAGNOSIS — M9902 Segmental and somatic dysfunction of thoracic region: Secondary | ICD-10-CM | POA: Diagnosis not present

## 2016-07-30 DIAGNOSIS — J029 Acute pharyngitis, unspecified: Secondary | ICD-10-CM

## 2016-07-30 DIAGNOSIS — R079 Chest pain, unspecified: Secondary | ICD-10-CM

## 2016-07-30 DIAGNOSIS — E669 Obesity, unspecified: Secondary | ICD-10-CM | POA: Diagnosis not present

## 2016-07-30 DIAGNOSIS — M9901 Segmental and somatic dysfunction of cervical region: Secondary | ICD-10-CM | POA: Diagnosis not present

## 2016-07-30 DIAGNOSIS — M542 Cervicalgia: Secondary | ICD-10-CM | POA: Diagnosis not present

## 2016-07-30 DIAGNOSIS — J310 Chronic rhinitis: Secondary | ICD-10-CM

## 2016-07-30 LAB — POCT RAPID STREP A (OFFICE): RAPID STREP A SCREEN: NEGATIVE

## 2016-07-30 MED ORDER — AMOXICILLIN 500 MG PO CAPS: 500.0000 mg | ORAL_CAPSULE | Freq: Three times a day (TID) | ORAL | 0 refills | Status: DC

## 2016-07-30 NOTE — Progress Notes (Signed)
   Subjective:    Patient ID: Donna Kim, female    DOB: 11/24/1981, 36 y.o.   MRN: [id number] Patient arrives office with many concerns Sinusitis  Episode onset: 5 days ago. Associated symptoms include coughing, headaches and a sore throat. (Fever) Treatments tried: excedrin, rest, fluids.   daught er had viral illness  Pt developed fever of 102 then 101 on fri and sat  achiness in the bones  Felt very strange and weird, pt cked to make sure not pregnant  achey in the chest  Chest pain left-sided sharp in nature patient worried about her heart. No pleuritic component   Review of Systems  HENT: Positive for sore throat.   Respiratory: Positive for cough.   Neurological: Positive for headaches.       Objective:   Physical Exam Alert moderate malaise. H&T moderate nasal congestion frontal gyrus pharynx normal neck supple lungs clear heart regular in rhythm chest wall not particularly painful abdomen benign   EKG normal sinus rhythm no significant ST-T changes    Assessment & Plan:  Impression chest pain highly likely musculoskeletal #2 viral syndrome #3 secondary rhinosinusitis #4 flare of anxiety frank discussion held in this regard patient's anxiety is so profound and free-floating she really is having a major challenge with this. Currently considering continuing counseling resistant to new medications 25 minutes spent most in discussion

## 2016-08-01 ENCOUNTER — Encounter: Payer: Self-pay | Admitting: Obstetrics & Gynecology

## 2016-08-01 ENCOUNTER — Ambulatory Visit (INDEPENDENT_AMBULATORY_CARE_PROVIDER_SITE_OTHER): Payer: BLUE CROSS/BLUE SHIELD | Admitting: Obstetrics & Gynecology

## 2016-08-01 VITALS — BP 118/80 | HR 88 | Ht 64.0 in | Wt 191.0 lb

## 2016-08-01 DIAGNOSIS — F330 Major depressive disorder, recurrent, mild: Secondary | ICD-10-CM | POA: Diagnosis not present

## 2016-08-01 DIAGNOSIS — M940 Chondrocostal junction syndrome [Tietze]: Secondary | ICD-10-CM

## 2016-08-01 DIAGNOSIS — F411 Generalized anxiety disorder: Secondary | ICD-10-CM | POA: Diagnosis not present

## 2016-08-02 ENCOUNTER — Encounter: Payer: Self-pay | Admitting: Family Medicine

## 2016-08-13 ENCOUNTER — Encounter (HOSPITAL_COMMUNITY): Payer: Self-pay | Admitting: Psychiatry

## 2016-08-13 ENCOUNTER — Ambulatory Visit (INDEPENDENT_AMBULATORY_CARE_PROVIDER_SITE_OTHER): Payer: BLUE CROSS/BLUE SHIELD | Admitting: Psychiatry

## 2016-08-13 DIAGNOSIS — F988 Other specified behavioral and emotional disorders with onset usually occurring in childhood and adolescence: Secondary | ICD-10-CM

## 2016-08-13 DIAGNOSIS — F909 Attention-deficit hyperactivity disorder, unspecified type: Secondary | ICD-10-CM | POA: Diagnosis not present

## 2016-08-13 DIAGNOSIS — F329 Major depressive disorder, single episode, unspecified: Secondary | ICD-10-CM

## 2016-08-13 DIAGNOSIS — F32A Depression, unspecified: Secondary | ICD-10-CM
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
## 2016-08-15 ENCOUNTER — Encounter: Payer: Self-pay | Admitting: Family Medicine

## 2016-08-15 NOTE — Progress Notes (Signed)
Chief Complaint  Patient presents with  . breast issue    feel uncomfortable and sore    Blood pressure 118/80, pulse 88, height 5\' 4"  (1.626 m), weight 191 lb (86.6 kg), last menstrual period 08/01/2016.  35 y.o. [id number] Patient's last menstrual period was 08/01/2016. The current method of family planning is none.  Outpatient Encounter Prescriptions as of 08/01/2016  Medication Sig Note  . amoxicillin (AMOXIL) 500 MG capsule Take 1 capsule (500 mg total) by mouth 3 (three) times daily.   Marland Kitchen aspirin-acetaminophen-caffeine (EXCEDRIN MIGRAINE) 250-250-65 MG tablet Take 1 tablet by mouth every 6 (six) hours as needed for headache.   . Probiotic Product (PROBIOTIC DAILY PO) Take by mouth.   . [DISCONTINUED] clonazePAM (KLONOPIN) 0.5 MG tablet Take 0.5 mg by mouth as needed for anxiety.   . [DISCONTINUED] Omega-3 Fatty Acids (FISH OIL PO) Take by mouth daily as needed. Reported on 05/09/2016   . ciprofloxacin-dexamethasone (CIPRODEX) otic suspension Place 4 drops into the right ear 2 (two) times daily. (Patient not taking: Reported on 07/30/2016)   . [DISCONTINUED] neomycin-polymyxin-hydrocortisone (CORTISPORIN) 3.5-10000-1 otic suspension  05/09/2016: Received from: External Pharmacy   No facility-administered encounter medications on file as of 08/01/2016.     Subjective Pt comes in complaining of about a month of "breast" pain Note from Dr Lance Sell office from yesterday is also reviewed Pt was informed of likelihood of chest wall musculoskeletal pain Pt has been increasing her swimming distance to beyond a mile  We also talked at length regarding her increasing depression and anxiety  Objective Breast no skin changes or erythema, nreast tissue itself with no masses palpable and non tender, the chest wall however is tender to palpation medially bilaterally and along left latera, breast tissue moved off of the chest wall and the pain remained in the sam places throughout   Pertinent  ROS No burning with urination, frequency or urgency No nausea, vomiting or diarrhea Nor fever chills or other constitutional symptoms   Labs or studies none    Impression Diagnoses this Encounter::   ICD-9-CM ICD-10-CM   1. Costochondritis 733.6 M94.0     Established relevant diagnosis(es): Depression/anxiety swimming  Plan/Recommendations: No orders of the defined types were placed in this encounter.   Labs or Scans Ordered: No orders of the defined types were placed in this encounter.   Management:: Form changes with swimming may have to curb distance  Follow up Return if symptoms worsen or fail to improve, for yearly.          All questions were answered.  Past Medical History:  Diagnosis Date  . Back pain 11/12/2013  . Contraceptive management 08/12/2014  . No pertinent past medical history   . Postpartum bleeding 07/15/2014  . Rosacea     Past Surgical History:  Procedure Laterality Date  . WISDOM TOOTH EXTRACTION      OB History    Gravida Para Term Preterm AB Living   2 2 2     2    SAB TAB Ectopic Multiple Live Births           2      Allergies  Allergen Reactions  . Cefzil [Cefprozil]     Vomiting, nausea, itching    Social History   Social History  . Marital status: Married    Spouse name: N/A  . Number of children: N/A  . Years of education: N/A   Social History Main Topics  . Smoking status: Never  Smoker  . Smokeless tobacco: Never Used  . Alcohol use Yes     Comment: occasionally wine  . Drug use: No  . Sexual activity: Yes    Birth control/ protection: Pill   Other Topics Concern  . None   Social History Narrative  . None    Family History  Problem Relation Age of Onset  . Adopted: Yes

## 2016-08-27 ENCOUNTER — Encounter (HOSPITAL_COMMUNITY): Payer: Self-pay | Admitting: Psychiatry

## 2016-08-27 ENCOUNTER — Ambulatory Visit (INDEPENDENT_AMBULATORY_CARE_PROVIDER_SITE_OTHER): Payer: BLUE CROSS/BLUE SHIELD | Admitting: Psychiatry

## 2016-08-27 DIAGNOSIS — F32A Depression, unspecified: Secondary | ICD-10-CM

## 2016-08-27 DIAGNOSIS — F329 Major depressive disorder, single episode, unspecified: Secondary | ICD-10-CM

## 2016-08-27 DIAGNOSIS — F988 Other specified behavioral and emotional disorders with onset usually occurring in childhood and adolescence: Secondary | ICD-10-CM

## 2016-08-27 DIAGNOSIS — F909 Attention-deficit hyperactivity disorder, unspecified type: Secondary | ICD-10-CM | POA: Diagnosis not present
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
## 2016-09-03 ENCOUNTER — Ambulatory Visit (INDEPENDENT_AMBULATORY_CARE_PROVIDER_SITE_OTHER): Payer: BLUE CROSS/BLUE SHIELD | Admitting: Psychiatry

## 2016-09-03 ENCOUNTER — Encounter (HOSPITAL_COMMUNITY): Payer: Self-pay | Admitting: Psychiatry

## 2016-09-03 DIAGNOSIS — F909 Attention-deficit hyperactivity disorder, unspecified type: Secondary | ICD-10-CM | POA: Diagnosis not present

## 2016-09-03 DIAGNOSIS — F32A Depression, unspecified: Secondary | ICD-10-CM

## 2016-09-03 DIAGNOSIS — F329 Major depressive disorder, single episode, unspecified: Secondary | ICD-10-CM | POA: Diagnosis not present
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
## 2016-09-04 ENCOUNTER — Ambulatory Visit (INDEPENDENT_AMBULATORY_CARE_PROVIDER_SITE_OTHER): Payer: BLUE CROSS/BLUE SHIELD | Admitting: Psychiatry

## 2016-09-04 ENCOUNTER — Encounter (HOSPITAL_COMMUNITY): Payer: Self-pay | Admitting: Psychiatry

## 2016-09-04 VITALS — BP 99/71 | HR 84 | Ht 64.0 in | Wt 188.6 lb

## 2016-09-04 DIAGNOSIS — F909 Attention-deficit hyperactivity disorder, unspecified type: Secondary | ICD-10-CM

## 2016-09-04 MED ORDER — LISDEXAMFETAMINE DIMESYLATE 40 MG PO CAPS: 40.0000 mg | ORAL_CAPSULE | ORAL | 0 refills | Status: DC

## 2016-09-04 MED ORDER — CLONAZEPAM 0.5 MG PO TABS: 0.5000 mg | ORAL_TABLET | Freq: Every day | ORAL | 2 refills | Status: DC | PRN
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
## 2016-09-10 ENCOUNTER — Ambulatory Visit (INDEPENDENT_AMBULATORY_CARE_PROVIDER_SITE_OTHER): Payer: BLUE CROSS/BLUE SHIELD | Admitting: Psychiatry

## 2016-09-10 ENCOUNTER — Encounter (HOSPITAL_COMMUNITY): Payer: Self-pay | Admitting: Psychiatry

## 2016-09-10 DIAGNOSIS — F909 Attention-deficit hyperactivity disorder, unspecified type: Secondary | ICD-10-CM
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
## 2016-09-17 ENCOUNTER — Encounter (HOSPITAL_COMMUNITY): Payer: Self-pay | Admitting: Psychiatry

## 2016-09-17 ENCOUNTER — Ambulatory Visit (INDEPENDENT_AMBULATORY_CARE_PROVIDER_SITE_OTHER): Payer: BLUE CROSS/BLUE SHIELD | Admitting: Psychiatry

## 2016-09-17 DIAGNOSIS — F329 Major depressive disorder, single episode, unspecified: Secondary | ICD-10-CM

## 2016-09-17 DIAGNOSIS — F909 Attention-deficit hyperactivity disorder, unspecified type: Secondary | ICD-10-CM

## 2016-09-17 DIAGNOSIS — F32A Depression, unspecified: Secondary | ICD-10-CM
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
## 2016-09-24 ENCOUNTER — Encounter (HOSPITAL_COMMUNITY): Payer: Self-pay | Admitting: Psychiatry

## 2016-09-24 ENCOUNTER — Ambulatory Visit (INDEPENDENT_AMBULATORY_CARE_PROVIDER_SITE_OTHER): Payer: BLUE CROSS/BLUE SHIELD | Admitting: Psychiatry

## 2016-09-24 DIAGNOSIS — F329 Major depressive disorder, single episode, unspecified: Secondary | ICD-10-CM

## 2016-09-24 DIAGNOSIS — F32A Depression, unspecified: Secondary | ICD-10-CM

## 2016-09-24 DIAGNOSIS — F909 Attention-deficit hyperactivity disorder, unspecified type: Secondary | ICD-10-CM
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
## 2016-09-30 ENCOUNTER — Telehealth: Payer: Self-pay | Admitting: Family Medicine

## 2016-09-30 NOTE — Telephone Encounter (Signed)
Pt called stating that her daughter was diagnosed with pin worms yesterday and is wanting to know if she needs to be tested. Please advise.     [facility]

## 2016-10-01 ENCOUNTER — Ambulatory Visit (HOSPITAL_COMMUNITY): Payer: Self-pay | Admitting: Psychiatry

## 2016-10-01 ENCOUNTER — Other Ambulatory Visit: Payer: Self-pay | Admitting: *Deleted

## 2016-10-01 MED ORDER — ALBENDAZOLE 200 MG PO TABS: 200.0000 mg | ORAL_TABLET | Freq: Once | ORAL | 0 refills | Status: AC

## 2016-10-01 NOTE — Telephone Encounter (Signed)
Pediatrician diagnosed and treated both daughters. Mother not having any symptoms but was told she should be treated

## 2016-10-01 NOTE — Telephone Encounter (Signed)
Discussed with pt. Med sent to pharm.  

## 2016-10-01 NOTE — Telephone Encounter (Signed)
Vermox 100 mg one time dose, if pharm does not have, ask them what they rec for poinworms

## 2016-10-01 NOTE — Telephone Encounter (Signed)
Who actually tested her daughter?

## 2016-10-01 NOTE — Telephone Encounter (Signed)
Ok lets do 

## 2016-10-01 NOTE — Telephone Encounter (Signed)
Pharm does not have vermox. They have albenza 200mg  once time dose

## 2016-10-07 DIAGNOSIS — M546 Pain in thoracic spine: Secondary | ICD-10-CM | POA: Diagnosis not present

## 2016-10-07 DIAGNOSIS — M9901 Segmental and somatic dysfunction of cervical region: Secondary | ICD-10-CM | POA: Diagnosis not present

## 2016-10-07 DIAGNOSIS — M542 Cervicalgia: Secondary | ICD-10-CM | POA: Diagnosis not present

## 2016-10-07 DIAGNOSIS — M9902 Segmental and somatic dysfunction of thoracic region: Secondary | ICD-10-CM | POA: Diagnosis not present

## 2016-10-08 ENCOUNTER — Encounter (HOSPITAL_COMMUNITY): Payer: Self-pay | Admitting: Psychiatry

## 2016-10-08 ENCOUNTER — Ambulatory Visit (INDEPENDENT_AMBULATORY_CARE_PROVIDER_SITE_OTHER): Payer: BLUE CROSS/BLUE SHIELD | Admitting: Psychiatry

## 2016-10-08 DIAGNOSIS — F32A Depression, unspecified: Secondary | ICD-10-CM

## 2016-10-08 DIAGNOSIS — F329 Major depressive disorder, single episode, unspecified: Secondary | ICD-10-CM

## 2016-10-08 DIAGNOSIS — F909 Attention-deficit hyperactivity disorder, unspecified type: Secondary | ICD-10-CM
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
## 2016-10-14 DIAGNOSIS — M9901 Segmental and somatic dysfunction of cervical region: Secondary | ICD-10-CM | POA: Diagnosis not present

## 2016-10-14 DIAGNOSIS — M9902 Segmental and somatic dysfunction of thoracic region: Secondary | ICD-10-CM | POA: Diagnosis not present

## 2016-10-14 DIAGNOSIS — M542 Cervicalgia: Secondary | ICD-10-CM | POA: Diagnosis not present

## 2016-10-14 DIAGNOSIS — M546 Pain in thoracic spine: Secondary | ICD-10-CM | POA: Diagnosis not present

## 2016-10-15 ENCOUNTER — Ambulatory Visit (INDEPENDENT_AMBULATORY_CARE_PROVIDER_SITE_OTHER): Payer: BLUE CROSS/BLUE SHIELD | Admitting: Psychiatry

## 2016-10-15 DIAGNOSIS — F32A Depression, unspecified: Secondary | ICD-10-CM

## 2016-10-15 DIAGNOSIS — F329 Major depressive disorder, single episode, unspecified: Secondary | ICD-10-CM

## 2016-10-15 DIAGNOSIS — M542 Cervicalgia: Secondary | ICD-10-CM | POA: Diagnosis not present

## 2016-10-15 DIAGNOSIS — M546 Pain in thoracic spine: Secondary | ICD-10-CM | POA: Diagnosis not present

## 2016-10-15 DIAGNOSIS — M9902 Segmental and somatic dysfunction of thoracic region: Secondary | ICD-10-CM | POA: Diagnosis not present

## 2016-10-15 DIAGNOSIS — M9901 Segmental and somatic dysfunction of cervical region: Secondary | ICD-10-CM | POA: Diagnosis not present

## 2016-10-15 DIAGNOSIS — F909 Attention-deficit hyperactivity disorder, unspecified type: Secondary | ICD-10-CM | POA: Diagnosis not present
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
## 2016-10-21 ENCOUNTER — Ambulatory Visit (INDEPENDENT_AMBULATORY_CARE_PROVIDER_SITE_OTHER): Payer: BLUE CROSS/BLUE SHIELD | Admitting: Psychiatry

## 2016-10-21 ENCOUNTER — Encounter (HOSPITAL_COMMUNITY): Payer: Self-pay | Admitting: Psychiatry

## 2016-10-21 DIAGNOSIS — F32A Depression, unspecified: Secondary | ICD-10-CM

## 2016-10-21 DIAGNOSIS — F909 Attention-deficit hyperactivity disorder, unspecified type: Secondary | ICD-10-CM | POA: Diagnosis not present

## 2016-10-21 DIAGNOSIS — F329 Major depressive disorder, single episode, unspecified: Secondary | ICD-10-CM | POA: Diagnosis not present
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
## 2016-10-29 ENCOUNTER — Ambulatory Visit (INDEPENDENT_AMBULATORY_CARE_PROVIDER_SITE_OTHER): Payer: BLUE CROSS/BLUE SHIELD | Admitting: Psychiatry

## 2016-10-29 ENCOUNTER — Encounter (HOSPITAL_COMMUNITY): Payer: Self-pay | Admitting: Psychiatry

## 2016-10-29 DIAGNOSIS — F329 Major depressive disorder, single episode, unspecified: Secondary | ICD-10-CM

## 2016-10-29 DIAGNOSIS — F909 Attention-deficit hyperactivity disorder, unspecified type: Secondary | ICD-10-CM

## 2016-10-29 DIAGNOSIS — F32A Depression, unspecified: Secondary | ICD-10-CM
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
## 2016-11-04 ENCOUNTER — Encounter (HOSPITAL_COMMUNITY): Payer: Self-pay | Admitting: Psychiatry

## 2016-11-04 ENCOUNTER — Ambulatory Visit (INDEPENDENT_AMBULATORY_CARE_PROVIDER_SITE_OTHER): Payer: BLUE CROSS/BLUE SHIELD | Admitting: Psychiatry

## 2016-11-04 VITALS — BP 120/81 | HR 82 | Ht 64.0 in | Wt 190.0 lb

## 2016-11-04 DIAGNOSIS — F909 Attention-deficit hyperactivity disorder, unspecified type: Secondary | ICD-10-CM | POA: Diagnosis not present

## 2016-11-04 DIAGNOSIS — Z7982 Long term (current) use of aspirin: Secondary | ICD-10-CM

## 2016-11-04 DIAGNOSIS — Z79899 Other long term (current) drug therapy: Secondary | ICD-10-CM

## 2016-11-04 MED ORDER — LISDEXAMFETAMINE DIMESYLATE 50 MG PO CAPS: 50.0000 mg | ORAL_CAPSULE | Freq: Every day | ORAL | 0 refills | Status: DC
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
## 2016-11-11 ENCOUNTER — Other Ambulatory Visit (HOSPITAL_COMMUNITY): Payer: Self-pay | Admitting: Psychiatry

## 2016-11-11 ENCOUNTER — Telehealth (HOSPITAL_COMMUNITY): Payer: Self-pay | Admitting: *Deleted

## 2016-11-11 MED ORDER — LISDEXAMFETAMINE DIMESYLATE 40 MG PO CAPS: 40.0000 mg | ORAL_CAPSULE | ORAL | 0 refills | Status: DC

## 2016-11-11 NOTE — Progress Notes (Unsigned)
vyvanse

## 2016-11-11 NOTE — Telephone Encounter (Signed)
printed

## 2016-11-11 NOTE — Telephone Encounter (Signed)
Pt called stating during her previous office visit, her Vyvanse was changed to a higher dose. Per pt, the higher dose is not working for her. Per pt, she would like for Dr. Harrington Challenger to decrease the mg to the next lower lower dose. Per pt during previous visit, provider gived her 3 months worth of script and if provider could do the same for this new mg. Pt number is [phone].

## 2016-11-11 NOTE — Telephone Encounter (Signed)
Spoke with pt and informed her that printed script is ready for pick up

## 2016-11-12 DIAGNOSIS — M9903 Segmental and somatic dysfunction of lumbar region: Secondary | ICD-10-CM | POA: Diagnosis not present

## 2016-11-12 DIAGNOSIS — M9906 Segmental and somatic dysfunction of lower extremity: Secondary | ICD-10-CM | POA: Diagnosis not present

## 2016-11-12 DIAGNOSIS — M545 Low back pain: Secondary | ICD-10-CM | POA: Diagnosis not present

## 2016-11-12 DIAGNOSIS — M25571 Pain in right ankle and joints of right foot: Secondary | ICD-10-CM | POA: Diagnosis not present

## 2016-11-13 ENCOUNTER — Encounter (HOSPITAL_COMMUNITY): Payer: Self-pay | Admitting: *Deleted

## 2016-11-13 ENCOUNTER — Encounter (HOSPITAL_COMMUNITY): Payer: Self-pay | Admitting: Psychiatry

## 2016-11-13 ENCOUNTER — Ambulatory Visit (INDEPENDENT_AMBULATORY_CARE_PROVIDER_SITE_OTHER): Payer: BLUE CROSS/BLUE SHIELD | Admitting: Psychiatry

## 2016-11-13 DIAGNOSIS — F909 Attention-deficit hyperactivity disorder, unspecified type: Secondary | ICD-10-CM | POA: Diagnosis not present

## 2016-11-13 DIAGNOSIS — F32A Depression, unspecified: Secondary | ICD-10-CM

## 2016-11-13 DIAGNOSIS — F329 Major depressive disorder, single episode, unspecified: Secondary | ICD-10-CM | POA: Diagnosis not present

## 2016-11-13 NOTE — Progress Notes (Signed)
Pt came into office pt pick up printed script she requested per previous call. Pt D/L number is [id number] with expiration date of 10-14-2024. Order number is [id number] with expiration date of 12-11-16.
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
## 2016-11-20 ENCOUNTER — Ambulatory Visit (INDEPENDENT_AMBULATORY_CARE_PROVIDER_SITE_OTHER): Payer: BLUE CROSS/BLUE SHIELD | Admitting: Family Medicine

## 2016-11-20 ENCOUNTER — Encounter: Payer: Self-pay | Admitting: Family Medicine

## 2016-11-20 VITALS — BP 118/74 | Temp 99.1°F | Ht 65.0 in | Wt 186.0 lb

## 2016-11-20 DIAGNOSIS — J329 Chronic sinusitis, unspecified: Secondary | ICD-10-CM

## 2016-11-20 DIAGNOSIS — J310 Chronic rhinitis: Secondary | ICD-10-CM

## 2016-11-20 MED ORDER — AZITHROMYCIN 250 MG PO TABS: ORAL_TABLET | ORAL | 0 refills | Status: DC

## 2016-11-20 NOTE — Progress Notes (Signed)
   Subjective:    Patient ID: Donna Kim, female    DOB: [birth year], 35 y.o.   MRN: [id number]  Sinusitis  This is a new problem. Episode onset: 3 days. The maximum temperature recorded prior to her arrival was 100.4 - 100.9 F. Associated symptoms include chills, congestion, coughing, ear pain, headaches, a hoarse voice, sinus pressure, sneezing and a sore throat. (Fever) Treatments tried: theraflu, dayquil, nyquil, benadryl,    Slight cough Thursday  Deep cough and congested   Wonders if partially related o christmas tree exposure  Pos alergy rxn to tree  took bendryl to the last night to help with cong  dayquil and nyquil    Took multi sym cough meds prn  Low grade temp  Energy level is good today  Review of Systems  Constitutional: Positive for chills.  HENT: Positive for congestion, ear pain, hoarse voice, sinus pressure, sneezing and sore throat.   Respiratory: Positive for cough.   Neurological: Positive for headaches.       Objective:   Physical Exam Alert, mild malaise. Hydration good Vitals stable. frontal/ maxillary tenderness evident positive nasal congestion. pharynx normal neck supple  lungs clear/no crackles or wheezes. heart regular in rhythm        Assessment & Plan:  Impression rhinosinusitis likely post viral, discussed with patient. plan antibiotics prescribed. Questions answered. Symptomatic care discussed. warning signs discussed. WSL

## 2016-11-28 ENCOUNTER — Encounter (HOSPITAL_COMMUNITY): Payer: Self-pay | Admitting: Psychiatry

## 2016-11-28 ENCOUNTER — Ambulatory Visit (INDEPENDENT_AMBULATORY_CARE_PROVIDER_SITE_OTHER): Payer: BLUE CROSS/BLUE SHIELD | Admitting: Psychiatry

## 2016-11-28 DIAGNOSIS — F329 Major depressive disorder, single episode, unspecified: Secondary | ICD-10-CM

## 2016-11-28 DIAGNOSIS — F909 Attention-deficit hyperactivity disorder, unspecified type: Secondary | ICD-10-CM

## 2016-11-28 DIAGNOSIS — F32A Depression, unspecified: Secondary | ICD-10-CM
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
## 2016-12-17 ENCOUNTER — Encounter (HOSPITAL_COMMUNITY): Payer: Self-pay | Admitting: Psychiatry

## 2016-12-17 ENCOUNTER — Ambulatory Visit (INDEPENDENT_AMBULATORY_CARE_PROVIDER_SITE_OTHER): Payer: BLUE CROSS/BLUE SHIELD | Admitting: Psychiatry

## 2016-12-17 DIAGNOSIS — F32A Depression, unspecified: Secondary | ICD-10-CM

## 2016-12-17 DIAGNOSIS — F329 Major depressive disorder, single episode, unspecified: Secondary | ICD-10-CM

## 2016-12-17 DIAGNOSIS — F909 Attention-deficit hyperactivity disorder, unspecified type: Secondary | ICD-10-CM | POA: Diagnosis not present
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
## 2017-01-02 ENCOUNTER — Ambulatory Visit (INDEPENDENT_AMBULATORY_CARE_PROVIDER_SITE_OTHER): Payer: BLUE CROSS/BLUE SHIELD | Admitting: Psychiatry

## 2017-01-02 ENCOUNTER — Encounter (HOSPITAL_COMMUNITY): Payer: Self-pay | Admitting: Psychiatry

## 2017-01-02 VITALS — BP 103/84 | HR 93 | Ht 64.0 in | Wt 190.2 lb

## 2017-01-02 DIAGNOSIS — Z7982 Long term (current) use of aspirin: Secondary | ICD-10-CM | POA: Diagnosis not present

## 2017-01-02 DIAGNOSIS — F909 Attention-deficit hyperactivity disorder, unspecified type: Secondary | ICD-10-CM

## 2017-01-02 DIAGNOSIS — F39 Unspecified mood [affective] disorder: Secondary | ICD-10-CM

## 2017-01-02 DIAGNOSIS — Z79899 Other long term (current) drug therapy: Secondary | ICD-10-CM | POA: Diagnosis not present

## 2017-01-02 MED ORDER — CLONAZEPAM 0.5 MG PO TABS: 0.5000 mg | ORAL_TABLET | Freq: Every day | ORAL | 2 refills | Status: DC | PRN

## 2017-01-02 MED ORDER — LISDEXAMFETAMINE DIMESYLATE 50 MG PO CAPS: 50.0000 mg | ORAL_CAPSULE | Freq: Every day | ORAL | 0 refills | Status: DC
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
## 2017-01-07 ENCOUNTER — Ambulatory Visit (INDEPENDENT_AMBULATORY_CARE_PROVIDER_SITE_OTHER): Payer: BLUE CROSS/BLUE SHIELD | Admitting: Psychiatry

## 2017-01-07 ENCOUNTER — Encounter (HOSPITAL_COMMUNITY): Payer: Self-pay | Admitting: Psychiatry

## 2017-01-07 DIAGNOSIS — F909 Attention-deficit hyperactivity disorder, unspecified type: Secondary | ICD-10-CM

## 2017-01-07 DIAGNOSIS — F32A Depression, unspecified: Secondary | ICD-10-CM

## 2017-01-07 DIAGNOSIS — F329 Major depressive disorder, single episode, unspecified: Secondary | ICD-10-CM | POA: Diagnosis not present
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
## 2017-02-25 ENCOUNTER — Ambulatory Visit (INDEPENDENT_AMBULATORY_CARE_PROVIDER_SITE_OTHER): Payer: BLUE CROSS/BLUE SHIELD | Admitting: Psychiatry

## 2017-02-25 DIAGNOSIS — F32A Depression, unspecified: Secondary | ICD-10-CM

## 2017-02-25 DIAGNOSIS — F329 Major depressive disorder, single episode, unspecified: Secondary | ICD-10-CM

## 2017-02-25 DIAGNOSIS — F909 Attention-deficit hyperactivity disorder, unspecified type: Secondary | ICD-10-CM
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
## 2017-03-12 ENCOUNTER — Ambulatory Visit (INDEPENDENT_AMBULATORY_CARE_PROVIDER_SITE_OTHER): Payer: BLUE CROSS/BLUE SHIELD | Admitting: Psychiatry

## 2017-03-12 ENCOUNTER — Encounter (HOSPITAL_COMMUNITY): Payer: Self-pay | Admitting: Psychiatry

## 2017-03-12 DIAGNOSIS — F329 Major depressive disorder, single episode, unspecified: Secondary | ICD-10-CM | POA: Diagnosis not present

## 2017-03-12 DIAGNOSIS — F909 Attention-deficit hyperactivity disorder, unspecified type: Secondary | ICD-10-CM | POA: Diagnosis not present

## 2017-03-12 DIAGNOSIS — F32A Depression, unspecified: Secondary | ICD-10-CM
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
## 2017-03-13 ENCOUNTER — Ambulatory Visit (HOSPITAL_COMMUNITY): Payer: Self-pay | Admitting: Psychiatry

## 2017-03-17 ENCOUNTER — Ambulatory Visit (INDEPENDENT_AMBULATORY_CARE_PROVIDER_SITE_OTHER): Payer: BLUE CROSS/BLUE SHIELD | Admitting: Adult Health

## 2017-03-17 ENCOUNTER — Encounter: Payer: Self-pay | Admitting: Adult Health

## 2017-03-17 VITALS — BP 122/70 | HR 89 | Ht 65.0 in | Wt 200.0 lb

## 2017-03-17 DIAGNOSIS — Z3201 Encounter for pregnancy test, result positive: Secondary | ICD-10-CM | POA: Diagnosis not present

## 2017-03-17 DIAGNOSIS — N912 Amenorrhea, unspecified: Secondary | ICD-10-CM

## 2017-03-17 DIAGNOSIS — Z3490 Encounter for supervision of normal pregnancy, unspecified, unspecified trimester: Secondary | ICD-10-CM

## 2017-03-17 DIAGNOSIS — O3680X0 Pregnancy with inconclusive fetal viability, not applicable or unspecified: Secondary | ICD-10-CM

## 2017-03-17 LAB — POCT URINE PREGNANCY: Preg Test, Ur: POSITIVE — AB

## 2017-03-17 NOTE — Progress Notes (Signed)
Subjective:     Patient ID: Donna Kim, female   DOB: 30-Oct-1981, 36 y.o.   MRN: [id number]  HPI Denajah is a 36 year old white female, married,in for UPT, has missed a period and had +HPT.She stopped vyvanse in February and klonopin was stopped in March.   Review of Systems +missed period Reviewed past medical,surgical, social and family history. Reviewed medications and allergies.     Objective:   Physical Exam BP 122/70 (BP Location: Right Arm, Patient Position: Sitting, Cuff Size: Normal)   Pulse 89   Ht 5\' 5"  (1.651 m)   Wt 200 lb (90.7 kg)   LMP 02/06/2017 (Exact Date)   BMI 33.28 kg/m UPT +, about 5+4 weeks, by LMP, with EDD 11/13/17,Skin warm and dry. Neck: mid line trachea, normal thyroid, good ROM, no lymphadenopathy noted. Lungs: clear to ausculation bilaterally. Cardiovascular: regular rate and rhythm.Abdomen is soft and non tender.    Assessment:     1. Positive pregnancy test   2. Pregnancy, unspecified gestational age   29. Encounter to determine fetal viability of pregnancy, single or unspecified fetus       Plan:     Return in 2 weeks for dating [place] Review handout on first trimester

## 2017-03-17 NOTE — Patient Instructions (Signed)
First Trimester of Pregnancy The first trimester of pregnancy is from week 1 until the end of week 13 (months 1 through 3). A week after a sperm fertilizes an egg, the egg will implant on the wall of the uterus. This embryo will begin to develop into a baby. Genes from you and your partner will form the baby. The female genes will determine whether the baby will be a boy or a girl. At 6-8 weeks, the eyes and face will be formed, and the heartbeat can be seen on ultrasound. At the end of 12 weeks, all the baby's organs will be formed. Now that you are pregnant, you will want to do everything you can to have a healthy baby. Two of the most important things are to get good prenatal care and to follow your health care provider's instructions. Prenatal care is all the medical care you receive before the baby's birth. This care will help prevent, find, and treat any problems during the pregnancy and childbirth. Body changes during your first trimester Your body goes through many changes during pregnancy. The changes vary from woman to woman.  You may gain or lose a couple of pounds at first.  You may feel sick to your stomach (nauseous) and you may throw up (vomit). If the vomiting is uncontrollable, call your health care provider.  You may tire easily.  You may develop headaches that can be relieved by medicines. All medicines should be approved by your health care provider.  You may urinate more often. Painful urination may mean you have a bladder infection.  You may develop heartburn as a result of your pregnancy.  You may develop constipation because certain hormones are causing the muscles that push stool through your intestines to slow down.  You may develop hemorrhoids or swollen veins (varicose veins).  Your breasts may begin to grow larger and become tender. Your nipples may stick out more, and the tissue that surrounds them (areola) may become darker.  Your gums may bleed and may be  sensitive to brushing and flossing.  Dark spots or blotches (chloasma, mask of pregnancy) may develop on your face. This will likely fade after the baby is born.  Your menstrual periods will stop.  You may have a loss of appetite.  You may develop cravings for certain kinds of food.  You may have changes in your emotions from day to day, such as being excited to be pregnant or being concerned that something may go wrong with the pregnancy and baby.  You may have more vivid and strange dreams.  You may have changes in your hair. These can include thickening of your hair, rapid growth, and changes in texture. Some women also have hair loss during or after pregnancy, or hair that feels dry or thin. Your hair will most likely return to normal after your baby is born.  What to expect at prenatal visits During a routine prenatal visit:  You will be weighed to make sure you and the baby are growing normally.  Your blood pressure will be taken.  Your abdomen will be measured to track your baby's growth.  The fetal heartbeat will be listened to between weeks 10 and 14 of your pregnancy.  Test results from any previous visits will be discussed.  Your health care provider may ask you:  How you are feeling.  If you are feeling the baby move.  If you have had any abnormal symptoms, such as leaking fluid, bleeding, severe headaches,   or abdominal cramping.  If you are using any tobacco products, including cigarettes, chewing tobacco, and electronic cigarettes.  If you have any questions.  Other tests that may be performed during your first trimester include:  Blood tests to find your blood type and to check for the presence of any previous infections. The tests will also be used to check for low iron levels (anemia) and protein on red blood cells (Rh antibodies). Depending on your risk factors, or if you previously had diabetes during pregnancy, you may have tests to check for high blood  sugar that affects pregnant women (gestational diabetes).  Urine tests to check for infections, diabetes, or protein in the urine.  An ultrasound to confirm the proper growth and development of the baby.  Fetal screens for spinal cord problems (spina bifida) and Down syndrome.  HIV (human immunodeficiency virus) testing. Routine prenatal testing includes screening for HIV, unless you choose not to have this test.  You may need other tests to make sure you and the baby are doing well.  Follow these instructions at home: Medicines  Follow your health care provider's instructions regarding medicine use. Specific medicines may be either safe or unsafe to take during pregnancy.  Take a prenatal vitamin that contains at least 600 micrograms (mcg) of folic acid.  If you develop constipation, try taking a stool softener if your health care provider approves. Eating and drinking  Eat a balanced diet that includes fresh fruits and vegetables, whole grains, good sources of protein such as meat, eggs, or tofu, and low-fat dairy. Your health care provider will help you determine the amount of weight gain that is right for you.  Avoid raw meat and uncooked cheese. These carry germs that can cause birth defects in the baby.  Eating four or five small meals rather than three large meals a day may help relieve nausea and vomiting. If you start to feel nauseous, eating a few soda crackers can be helpful. Drinking liquids between meals, instead of during meals, also seems to help ease nausea and vomiting.  Limit foods that are high in fat and processed sugars, such as fried and sweet foods.  To prevent constipation: ? Eat foods that are high in fiber, such as fresh fruits and vegetables, whole grains, and beans. ? Drink enough fluid to keep your urine clear or pale yellow. Activity  Exercise only as directed by your health care provider. Most women can continue their usual exercise routine during  pregnancy. Try to exercise for 30 minutes at least 5 days a week. Exercising will help you: ? Control your weight. ? Stay in shape. ? Be prepared for labor and delivery.  Experiencing pain or cramping in the lower abdomen or lower back is a good sign that you should stop exercising. Check with your health care provider before continuing with normal exercises.  Try to avoid standing for long periods of time. Move your legs often if you must stand in one place for a long time.  Avoid heavy lifting.  Wear low-heeled shoes and practice good posture.  You may continue to have sex unless your health care provider tells you not to. Relieving pain and discomfort  Wear a good support bra to relieve breast tenderness.  Take warm sitz baths to soothe any pain or discomfort caused by hemorrhoids. Use hemorrhoid cream if your health care provider approves.  Rest with your legs elevated if you have leg cramps or low back pain.  If you develop   varicose veins in your legs, wear support hose. Elevate your feet for 15 minutes, 3-4 times a day. Limit salt in your diet. Prenatal care  Schedule your prenatal visits by the twelfth week of pregnancy. They are usually scheduled monthly at first, then more often in the last 2 months before delivery.  Write down your questions. Take them to your prenatal visits.  Keep all your prenatal visits as told by your health care provider. This is important. Safety  Wear your seat belt at all times when driving.  Make a list of emergency phone numbers, including numbers for family, friends, the hospital, and police and fire departments. General instructions  Ask your health care provider for a referral to a local prenatal education class. Begin classes no later than the beginning of month 6 of your pregnancy.  Ask for help if you have counseling or nutritional needs during pregnancy. Your health care provider can offer advice or refer you to specialists for help  with various needs.  Do not use hot tubs, steam rooms, or saunas.  Do not douche or use tampons or scented sanitary pads.  Do not cross your legs for long periods of time.  Avoid cat litter boxes and soil used by cats. These carry germs that can cause birth defects in the baby and possibly loss of the fetus by miscarriage or stillbirth.  Avoid all smoking, herbs, alcohol, and medicines not prescribed by your health care provider. Chemicals in these products affect the formation and growth of the baby.  Do not use any products that contain nicotine or tobacco, such as cigarettes and e-cigarettes. If you need help quitting, ask your health care provider. You may receive counseling support and other resources to help you quit.  Schedule a dentist appointment. At home, brush your teeth with a soft toothbrush and be gentle when you floss. Contact a health care provider if:  You have dizziness.  You have mild pelvic cramps, pelvic pressure, or nagging pain in the abdominal area.  You have persistent nausea, vomiting, or diarrhea.  You have a bad smelling vaginal discharge.  You have pain when you urinate.  You notice increased swelling in your face, hands, legs, or ankles.  You are exposed to fifth disease or chickenpox.  You are exposed to German measles (rubella) and have never had it. Get help right away if:  You have a fever.  You are leaking fluid from your vagina.  You have spotting or bleeding from your vagina.  You have severe abdominal cramping or pain.  You have rapid weight gain or loss.  You vomit blood or material that looks like coffee grounds.  You develop a severe headache.  You have shortness of breath.  You have any kind of trauma, such as from a fall or a car accident. Summary  The first trimester of pregnancy is from week 1 until the end of week 13 (months 1 through 3).  Your body goes through many changes during pregnancy. The changes vary from  woman to woman.  You will have routine prenatal visits. During those visits, your health care provider will examine you, discuss any test results you may have, and talk with you about how you are feeling. This information is not intended to replace advice given to you by your health care provider. Make sure you discuss any questions you have with your health care provider. Document Released: 11/12/2001 Document Revised: 10/30/2016 Document Reviewed: 10/30/2016 [employer] Interactive Patient Education  2017 [employer]  

## 2017-03-19 ENCOUNTER — Ambulatory Visit (HOSPITAL_COMMUNITY): Payer: Self-pay | Admitting: Psychiatry

## 2017-03-26 ENCOUNTER — Encounter (HOSPITAL_COMMUNITY): Payer: Self-pay | Admitting: Psychiatry

## 2017-03-26 ENCOUNTER — Ambulatory Visit (INDEPENDENT_AMBULATORY_CARE_PROVIDER_SITE_OTHER): Payer: BLUE CROSS/BLUE SHIELD | Admitting: Psychiatry

## 2017-03-26 DIAGNOSIS — F329 Major depressive disorder, single episode, unspecified: Secondary | ICD-10-CM

## 2017-03-26 DIAGNOSIS — F909 Attention-deficit hyperactivity disorder, unspecified type: Secondary | ICD-10-CM

## 2017-03-26 DIAGNOSIS — F32A Depression, unspecified: Secondary | ICD-10-CM
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
## 2017-03-31 ENCOUNTER — Ambulatory Visit (INDEPENDENT_AMBULATORY_CARE_PROVIDER_SITE_OTHER): Payer: BLUE CROSS/BLUE SHIELD

## 2017-03-31 DIAGNOSIS — O3680X0 Pregnancy with inconclusive fetal viability, not applicable or unspecified: Secondary | ICD-10-CM | POA: Diagnosis not present

## 2017-03-31 NOTE — Progress Notes (Signed)
[place] 7+4 wks,single IUP,w/ys,positive FHT 123 bpm,normal ovaries bilat,crl 9.17 mm,EDD 11/13/2017 by LMP

## 2017-04-01 ENCOUNTER — Ambulatory Visit (HOSPITAL_COMMUNITY): Payer: Self-pay | Admitting: Psychiatry

## 2017-04-03 DIAGNOSIS — M542 Cervicalgia: Secondary | ICD-10-CM | POA: Diagnosis not present

## 2017-04-03 DIAGNOSIS — M9901 Segmental and somatic dysfunction of cervical region: Secondary | ICD-10-CM | POA: Diagnosis not present

## 2017-04-03 DIAGNOSIS — M9903 Segmental and somatic dysfunction of lumbar region: Secondary | ICD-10-CM | POA: Diagnosis not present

## 2017-04-03 DIAGNOSIS — M545 Low back pain: Secondary | ICD-10-CM | POA: Diagnosis not present

## 2017-04-09 ENCOUNTER — Ambulatory Visit (INDEPENDENT_AMBULATORY_CARE_PROVIDER_SITE_OTHER): Payer: BLUE CROSS/BLUE SHIELD | Admitting: Psychiatry

## 2017-04-09 DIAGNOSIS — F329 Major depressive disorder, single episode, unspecified: Secondary | ICD-10-CM

## 2017-04-09 DIAGNOSIS — F32A Depression, unspecified: Secondary | ICD-10-CM

## 2017-04-09 DIAGNOSIS — F909 Attention-deficit hyperactivity disorder, unspecified type: Secondary | ICD-10-CM | POA: Diagnosis not present
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
## 2017-04-11 ENCOUNTER — Telehealth: Payer: Self-pay | Admitting: *Deleted

## 2017-04-11 NOTE — Telephone Encounter (Signed)
Patient called with complaints of cold/allergy symptoms. States she has a runny nose, cough, headache. Advised she could take Claritin or Zyrtec, saline spray if needed, push fluids, good hand hygiene and alcohol free cough drops.  Advised to monitor for a fever and if one develops, to take Tylenol to lower fever. No further questions, verbalized understanding.

## 2017-04-14 ENCOUNTER — Encounter: Payer: Self-pay | Admitting: Women's Health

## 2017-04-14 ENCOUNTER — Other Ambulatory Visit (HOSPITAL_COMMUNITY)
Admission: RE | Admit: 2017-04-14 | Discharge: 2017-04-14 | Disposition: A | Discharge status: Home / Self Care | Payer: BLUE CROSS/BLUE SHIELD | Source: Ambulatory Visit | Attending: Obstetrics & Gynecology | Admitting: Obstetrics & Gynecology

## 2017-04-14 ENCOUNTER — Ambulatory Visit (INDEPENDENT_AMBULATORY_CARE_PROVIDER_SITE_OTHER): Payer: BLUE CROSS/BLUE SHIELD | Admitting: Women's Health

## 2017-04-14 ENCOUNTER — Ambulatory Visit: Payer: BLUE CROSS/BLUE SHIELD | Admitting: *Deleted

## 2017-04-14 VITALS — BP 110/82 | HR 91 | Wt 200.0 lb

## 2017-04-14 DIAGNOSIS — Z124 Encounter for screening for malignant neoplasm of cervix: Secondary | ICD-10-CM | POA: Diagnosis not present

## 2017-04-14 DIAGNOSIS — F418 Other specified anxiety disorders: Secondary | ICD-10-CM | POA: Insufficient documentation

## 2017-04-14 DIAGNOSIS — Z3490 Encounter for supervision of normal pregnancy, unspecified, unspecified trimester: Secondary | ICD-10-CM | POA: Insufficient documentation

## 2017-04-14 DIAGNOSIS — F900 Attention-deficit hyperactivity disorder, predominantly inattentive type: Secondary | ICD-10-CM

## 2017-04-14 DIAGNOSIS — Z3A09 9 weeks gestation of pregnancy: Secondary | ICD-10-CM | POA: Diagnosis not present

## 2017-04-14 DIAGNOSIS — Z3481 Encounter for supervision of other normal pregnancy, first trimester: Secondary | ICD-10-CM | POA: Diagnosis not present

## 2017-04-14 DIAGNOSIS — Z3682 Encounter for antenatal screening for nuchal translucency: Secondary | ICD-10-CM

## 2017-04-14 DIAGNOSIS — F419 Anxiety disorder, unspecified: Secondary | ICD-10-CM

## 2017-04-14 DIAGNOSIS — Z1389 Encounter for screening for other disorder: Secondary | ICD-10-CM

## 2017-04-14 DIAGNOSIS — Z331 Pregnant state, incidental: Secondary | ICD-10-CM

## 2017-04-14 LAB — POCT URINALYSIS DIPSTICK
Blood, UA: NEGATIVE
Glucose, UA: NEGATIVE
Ketones, UA: NEGATIVE
Leukocytes, UA: NEGATIVE
NITRITE UA: NEGATIVE
PROTEIN UA: NEGATIVE

## 2017-04-14 LAB — OB RESULTS CONSOLE GC/CHLAMYDIA: GC PROBE AMP, GENITAL: NEGATIVE

## 2017-04-14 NOTE — Patient Instructions (Addendum)
Nausea & Vomiting  Have saltine crackers or pretzels by your bed and eat a few bites before you raise your head out of bed in the morning  Eat small frequent meals throughout the day instead of large meals  Drink plenty of fluids throughout the day to stay hydrated, just don't drink a lot of fluids with your meals.  This can make your stomach fill up faster making you feel sick  Do not brush your teeth right after you eat  Products with real ginger are good for nausea, like ginger ale and ginger hard candy Make sure it says made with real ginger!  Sucking on sour candy like lemon heads is also good for nausea  If your prenatal vitamins make you nauseated, take them at night so you will sleep through the nausea  Sea Bands  If you feel like you need medicine for the nausea & vomiting please let us know  If you are unable to keep any fluids or food down please let us know   Constipation  Drink plenty of fluid, preferably water, throughout the day  Eat foods high in fiber such as fruits, vegetables, and grains  Exercise, such as walking, is a good way to keep your bowels regular  Drink warm fluids, especially warm prune juice, or decaf coffee  Eat a 1/2 cup of real oatmeal (not instant), 1/2 cup applesauce, and 1/2-1 cup warm prune juice every day  If needed, you may take Colace (docusate sodium) stool softener once or twice a day to help keep the stool soft. If you are pregnant, wait until you are out of your first trimester (12-14 weeks of pregnancy)  If you still are having problems with constipation, you may take Miralax once daily as needed to help keep your bowels regular.  If you are pregnant, wait until you are out of your first trimester (12-14 weeks of pregnancy)  For Headaches:   Stay well hydrated, drink enough water so that your urine is clear, sometimes if you are dehydrated you can get headaches  Eat small frequent meals and snacks, sometimes if you are hungry  you can get headaches  Sometimes you get headaches during pregnancy from the pregnancy hormones  You can try tylenol (1-2 regular strength 325mg  or 1-2 extra strength 500mg ) as directed on the box. The least amount of medication that works is best.   Cool compresses (cool wet washcloth or ice pack) to area of head that is hurting  You can also try drinking a caffeinated drink to see if this will help  If not helping, try below:  For Prevention of Headaches/Migraines:  CoQ10 100mg  three times daily  Vitamin B2 400mg  daily  Magnesium Oxide 400-600mg  daily  If You Get a Bad Headache/Migraine:  Benadryl 25mg    Magnesium Oxide  1 large Gatorade  2 extra strength Tylenol (1,000mg  total)  1 cup coffee or Coke  If this doesn't help please call us @ [phone]    First Trimester of Pregnancy The first trimester of pregnancy is from week 1 until the end of week 12 (months 1 through 3). A week after a sperm fertilizes an egg, the egg will implant on the wall of the uterus. This embryo will begin to develop into a baby. Genes from you and your partner are forming the baby. The female genes determine whether the baby is a boy or a girl. At 6-8 weeks, the eyes and face are formed, and the heartbeat can be seen  on ultrasound. At the end of 12 weeks, all the baby's organs are formed.  Now that you are pregnant, you will want to do everything you can to have a healthy baby. Two of the most important things are to get good prenatal care and to follow your health care provider's instructions. Prenatal care is all the medical care you receive before the baby's birth. This care will help prevent, find, and treat any problems during the pregnancy and childbirth. BODY CHANGES Your body goes through many changes during pregnancy. The changes vary from woman to woman.   You may gain or lose a couple of pounds at first.  You may feel sick to your stomach (nauseous) and throw up (vomit). If the  vomiting is uncontrollable, call your health care provider.  You may tire easily.  You may develop headaches that can be relieved by medicines approved by your health care provider.  You may urinate more often. Painful urination may mean you have a bladder infection.  You may develop heartburn as a result of your pregnancy.  You may develop constipation because certain hormones are causing the muscles that push waste through your intestines to slow down.  You may develop hemorrhoids or swollen, bulging veins (varicose veins).  Your breasts may begin to grow larger and become tender. Your nipples may stick out more, and the tissue that surrounds them (areola) may become darker.  Your gums may bleed and may be sensitive to brushing and flossing.  Dark spots or blotches (chloasma, mask of pregnancy) may develop on your face. This will likely fade after the baby is born.  Your menstrual periods will stop.  You may have a loss of appetite.  You may develop cravings for certain kinds of food.  You may have changes in your emotions from day to day, such as being excited to be pregnant or being concerned that something may go wrong with the pregnancy and baby.  You may have more vivid and strange dreams.  You may have changes in your hair. These can include thickening of your hair, rapid growth, and changes in texture. Some women also have hair loss during or after pregnancy, or hair that feels dry or thin. Your hair will most likely return to normal after your baby is born. WHAT TO EXPECT AT YOUR PRENATAL VISITS During a routine prenatal visit:  You will be weighed to make sure you and the baby are growing normally.  Your blood pressure will be taken.  Your abdomen will be measured to track your baby's growth.  The fetal heartbeat will be listened to starting around week 10 or 12 of your pregnancy.  Test results from any previous visits will be discussed. Your health care provider  may ask you:  How you are feeling.  If you are feeling the baby move.  If you have had any abnormal symptoms, such as leaking fluid, bleeding, severe headaches, or abdominal cramping.  If you have any questions. Other tests that may be performed during your first trimester include:  Blood tests to find your blood type and to check for the presence of any previous infections. They will also be used to check for low iron levels (anemia) and Rh antibodies. Later in the pregnancy, blood tests for diabetes will be done along with other tests if problems develop.  Urine tests to check for infections, diabetes, or protein in the urine.  An ultrasound to confirm the proper growth and development of the baby.  An amniocentesis to check for possible genetic problems.  Fetal screens for spina bifida and Down syndrome.  You may need other tests to make sure you and the baby are doing well. HOME CARE INSTRUCTIONS  Medicines  Follow your health care provider's instructions regarding medicine use. Specific medicines may be either safe or unsafe to take during pregnancy.  Take your prenatal vitamins as directed.  If you develop constipation, try taking a stool softener if your health care provider approves. Diet  Eat regular, well-balanced meals. Choose a variety of foods, such as meat or vegetable-based protein, fish, milk and low-fat dairy products, vegetables, fruits, and whole grain breads and cereals. Your health care provider will help you determine the amount of weight gain that is right for you.  Avoid raw meat and uncooked cheese. These carry germs that can cause birth defects in the baby.  Eating four or five small meals rather than three large meals a day may help relieve nausea and vomiting. If you start to feel nauseous, eating a few soda crackers can be helpful. Drinking liquids between meals instead of during meals also seems to help nausea and vomiting.  If you develop  constipation, eat more high-fiber foods, such as fresh vegetables or fruit and whole grains. Drink enough fluids to keep your urine clear or pale yellow. Activity and Exercise  Exercise only as directed by your health care provider. Exercising will help you:  Control your weight.  Stay in shape.  Be prepared for labor and delivery.  Experiencing pain or cramping in the lower abdomen or low back is a good sign that you should stop exercising. Check with your health care provider before continuing normal exercises.  Try to avoid standing for long periods of time. Move your legs often if you must stand in one place for a long time.  Avoid heavy lifting.  Wear low-heeled shoes, and practice good posture.  You may continue to have sex unless your health care provider directs you otherwise. Relief of Pain or Discomfort  Wear a good support bra for breast tenderness.   Take warm sitz baths to soothe any pain or discomfort caused by hemorrhoids. Use hemorrhoid cream if your health care provider approves.   Rest with your legs elevated if you have leg cramps or low back pain.  If you develop varicose veins in your legs, wear support hose. Elevate your feet for 15 minutes, 3-4 times a day. Limit salt in your diet. Prenatal Care  Schedule your prenatal visits by the twelfth week of pregnancy. They are usually scheduled monthly at first, then more often in the last 2 months before delivery.  Write down your questions. Take them to your prenatal visits.  Keep all your prenatal visits as directed by your health care provider. Safety  Wear your seat belt at all times when driving.  Make a list of emergency phone numbers, including numbers for family, friends, the hospital, and police and fire departments. General Tips  Ask your health care provider for a referral to a local prenatal education class. Begin classes no later than at the beginning of month 6 of your pregnancy.  Ask for  help if you have counseling or nutritional needs during pregnancy. Your health care provider can offer advice or refer you to specialists for help with various needs.  Do not use hot tubs, steam rooms, or saunas.  Do not douche or use tampons or scented sanitary pads.  Do not cross your legs for  long periods of time.  Avoid cat litter boxes and soil used by cats. These carry germs that can cause birth defects in the baby and possibly loss of the fetus by miscarriage or stillbirth.  Avoid all smoking, herbs, alcohol, and medicines not prescribed by your health care provider. Chemicals in these affect the formation and growth of the baby.  Schedule a dentist appointment. At home, brush your teeth with a soft toothbrush and be gentle when you floss. SEEK MEDICAL CARE IF:   You have dizziness.  You have mild pelvic cramps, pelvic pressure, or nagging pain in the abdominal area.  You have persistent nausea, vomiting, or diarrhea.  You have a bad smelling vaginal discharge.  You have pain with urination.  You notice increased swelling in your face, hands, legs, or ankles. SEEK IMMEDIATE MEDICAL CARE IF:   You have a fever.  You are leaking fluid from your vagina.  You have spotting or bleeding from your vagina.  You have severe abdominal cramping or pain.  You have rapid weight gain or loss.  You vomit blood or material that looks like coffee grounds.  You are exposed to [place] measles and have never had them.  You are exposed to fifth disease or chickenpox.  You develop a severe headache.  You have shortness of breath.  You have any kind of trauma, such as from a fall or a car accident. Document Released: 11/12/2001 Document Revised: 04/04/2014 Document Reviewed: 09/28/2013 [facility] Patient Information 2015 [place], [place]. This information is not intended to replace advice given to you by your health care provider. Make sure you discuss any questions you have with your  health care provider.

## 2017-04-14 NOTE — Progress Notes (Signed)
  Subjective:  Donna Kim is a 36 y.o. G60P2002 Caucasian female at [redacted]w[redacted]d by LMP c/w 6wk u/s, being seen today for her first obstetrical visit.  Her obstetrical history is significant for term uncomplicated svb x 2; anxiety/ADHD- stopped both vivanz and klonopin prior to trying to conceive.  Pregnancy history fully reviewed.  Patient reports allergies, claritin helping some. Productive cough. No fever/chills. Denies vb, cramping, uti s/s, abnormal/malodorous vag d/c, or vulvovaginal itching/irritation.  BP 110/82   Pulse 91   Wt 200 lb (90.7 kg)   LMP 02/06/2017 (Exact Date)   BMI 33.28 kg/m   HISTORY: OB History  Gravida Para Term Preterm AB Living  3 2 2     2   SAB TAB Ectopic Multiple Live Births          2    # Outcome Date GA Lbr Len/2nd Weight Sex Delivery Anes PTL Lv  3 Current           2 Term 07/04/14 [redacted]w[redacted]d 01:59 / 00:08 8 lb 15.4 oz (4.065 kg) F Vag-Spont None N LIV  1 Term 01/02/12 [redacted]w[redacted]d 10:50 / 05:14 7 lb 2.6 oz (3.249 kg) F Vag-Spont EPI N LIV     Birth Comments: none     Past Medical History:  Diagnosis Date  . Back pain 11/12/2013  . Contraceptive management 08/12/2014  . Mental disorder    anxiety  . No pertinent past medical history   . Postpartum bleeding 07/15/2014  . Rosacea   . Vaginal Pap smear, abnormal    Past Surgical History:  Procedure Laterality Date  . WISDOM TOOTH EXTRACTION     Family History  Problem Relation Age of Onset  . Adopted: Yes  . ADD / ADHD Daughter     Exam   System:     General: Well developed & nourished, no acute distress   Skin: Warm & dry, normal coloration and turgor, no rashes   Neurologic: Alert & oriented, normal mood   Cardiovascular: Regular rate & rhythm   Respiratory: Effort & rate normal, LCTAB, acyanotic   Abdomen: Soft, non tender   Extremities: normal strength, tone   Pelvic Exam:    Perineum: Normal perineum   Vulva: Normal, no lesions   Vagina:  Normal mucosa, normal discharge   Cervix:  Normal, bulbous, appears closed   Uterus: Normal size/shape/contour for GA   Thin prep pap smear obtained w/ high risk HPV cotesting FHR: + via informal u/s   Assessment:   Pregnancy: [id number] Patient Active Problem List   Diagnosis Date Noted  . Rosacea, acne 03/18/2014    Priority: High  . Supervision of normal pregnancy 04/14/2017  . Depression, postpartum, postpartum condition 12/17/2014  . Insomnia 12/17/2014  . ADHD (attention deficit hyperactivity disorder), inattentive type 10/02/2014    [redacted]w[redacted]d G3P2002 New OB visit Anxiety ADHD  Plan:  Initial labs obtained Continue prenatal vitamins Problem list reviewed and updated Reviewed n/v relief measures and warning s/s to report Reviewed recommended weight gain based on pre-gravid BMI Encouraged well-balanced diet Genetic Screening discussed Integrated Screen: requested Cystic fibrosis screening discussed declined Ultrasound discussed; fetal survey: requested Follow up in 3 weeks for 1st IT/NT and visit [facility] completed but not sent, not applying for preg [place] Can take Klonopin if needed-only takes prn, try to wait until out of 1st trimester if possible  Tawnya Crook CNM, [facility] 04/14/2017 3:54 PM

## 2017-04-15 LAB — HEPATITIS B SURFACE ANTIGEN: Hepatitis B Surface Ag: NEGATIVE

## 2017-04-15 LAB — RUBELLA SCREEN: RUBELLA: 5.4 {index} (ref >0.99)

## 2017-04-15 LAB — URINALYSIS, ROUTINE W REFLEX MICROSCOPIC
BILIRUBIN UA: NEGATIVE
GLUCOSE, UA: NEGATIVE
Ketones, UA: NEGATIVE
Leukocytes, UA: NEGATIVE
Nitrite, UA: NEGATIVE
PROTEIN UA: NEGATIVE
RBC UA: NEGATIVE
Specific Gravity, UA: 1.019 (ref 1.005–1.030)
UUROB: 0.2 mg/dL (ref 0.2–1.0)
pH, UA: 7.5 (ref 5.0–7.5)

## 2017-04-15 LAB — PMP SCREEN PROFILE (10S), URINE
Amphetamine Scrn, Ur: NEGATIVE ng/mL
BARBITURATE SCREEN URINE: NEGATIVE ng/mL
BENZODIAZEPINE SCREEN, URINE: NEGATIVE ng/mL
CANNABINOIDS UR QL SCN: NEGATIVE ng/mL
CREATININE(CRT), U: 68.7 mg/dL (ref 20.0–300.0)
Cocaine (Metab) Scrn, Ur: NEGATIVE ng/mL
METHADONE SCREEN, URINE: NEGATIVE ng/mL
OPIATE SCREEN URINE: NEGATIVE ng/mL
OXYCODONE+OXYMORPHONE UR QL SCN: NEGATIVE ng/mL
Ph of Urine: 7.6 (ref 4.5–8.9)
Phencyclidine Qn, Ur: NEGATIVE ng/mL
Propoxyphene Scrn, Ur: NEGATIVE ng/mL

## 2017-04-15 LAB — HIV ANTIBODY (ROUTINE TESTING W REFLEX): HIV SCREEN 4TH GENERATION: NONREACTIVE

## 2017-04-15 LAB — MED LIST OPTION NOT SELECTED

## 2017-04-15 LAB — ANTIBODY SCREEN: ANTIBODY SCREEN: NEGATIVE

## 2017-04-15 LAB — CBC
HEMATOCRIT: 40.5 % (ref 34.0–46.6)
HEMOGLOBIN: 13.3 g/dL (ref 11.1–15.9)
MCH: 29.8 pg (ref 26.6–33.0)
MCHC: 32.8 g/dL (ref 31.5–35.7)
MCV: 91 fL (ref 79–97)
Platelets: 181 10*3/uL (ref 150–379)
RBC: 4.47 x10E6/uL (ref 3.77–5.28)
RDW: 13.6 % (ref 12.3–15.4)
WBC: 11.4 10*3/uL — AB (ref 3.4–10.8)

## 2017-04-15 LAB — VARICELLA ZOSTER ANTIBODY, IGG: VARICELLA: 938 {index} (ref >165)

## 2017-04-15 LAB — ABO/RH: Rh Factor: POSITIVE

## 2017-04-15 LAB — RPR: RPR Ser Ql: NONREACTIVE

## 2017-04-16 ENCOUNTER — Encounter: Payer: Self-pay | Admitting: Women's Health

## 2017-04-16 ENCOUNTER — Other Ambulatory Visit: Payer: Self-pay | Admitting: Women's Health

## 2017-04-16 ENCOUNTER — Telehealth: Payer: Self-pay | Admitting: *Deleted

## 2017-04-16 DIAGNOSIS — R8271 Bacteriuria: Secondary | ICD-10-CM | POA: Insufficient documentation

## 2017-04-16 LAB — CYTOLOGY - PAP
Chlamydia: NEGATIVE
Diagnosis: NEGATIVE
HPV: NOT DETECTED
Neisseria Gonorrhea: NEGATIVE

## 2017-04-16 LAB — URINE CULTURE

## 2017-04-16 MED ORDER — AMOXICILLIN 500 MG PO CAPS
500.0000 mg | ORAL_CAPSULE | Freq: Two times a day (BID) | ORAL | 0 refills | Status: DC
Start: 2017-04-16 — End: 2017-05-05

## 2017-04-16 NOTE — Telephone Encounter (Signed)
Informed patient urine culture showed GBS UTI and prescription was sent to pharmacy. Also informed she will need to be treated during labor. Verbalized understanding.

## 2017-04-21 ENCOUNTER — Telehealth: Payer: Self-pay | Admitting: *Deleted

## 2017-04-21 NOTE — Telephone Encounter (Signed)
Patient states she will come tomorrow to leave sample. She is just irritable, cranky and "just uncomfortable down there". No burning.

## 2017-04-21 NOTE — Telephone Encounter (Addendum)
Patient called stating she is still having UTI symptoms with no relief and is cranky and irritable. She has 1 day left of the antibiotic and doesn't know if she needs to come in and leave another sample or change antibiotics.  She also pulled a tick off her this am. She did not save the tick but for advised for future reference, to save. Advised to wash are with antibacterial soap and apply triple antibiotic ointment. Encouraged to monitor area for redness, "bulls-eye", fever for the next few days/weeks.   Please advise on UTI.

## 2017-04-22 ENCOUNTER — Encounter: Payer: Self-pay | Admitting: *Deleted

## 2017-04-22 ENCOUNTER — Telehealth: Payer: Self-pay | Admitting: *Deleted

## 2017-04-22 ENCOUNTER — Other Ambulatory Visit: Payer: BLUE CROSS/BLUE SHIELD

## 2017-04-22 DIAGNOSIS — R102 Pelvic and perineal pain: Secondary | ICD-10-CM

## 2017-04-23 ENCOUNTER — Telehealth: Payer: Self-pay | Admitting: *Deleted

## 2017-04-23 ENCOUNTER — Ambulatory Visit (INDEPENDENT_AMBULATORY_CARE_PROVIDER_SITE_OTHER): Payer: BLUE CROSS/BLUE SHIELD | Admitting: Psychiatry

## 2017-04-23 ENCOUNTER — Encounter (HOSPITAL_COMMUNITY): Payer: Self-pay | Admitting: Psychiatry

## 2017-04-23 DIAGNOSIS — F909 Attention-deficit hyperactivity disorder, unspecified type: Secondary | ICD-10-CM | POA: Diagnosis not present

## 2017-04-23 DIAGNOSIS — F329 Major depressive disorder, single episode, unspecified: Secondary | ICD-10-CM

## 2017-04-23 DIAGNOSIS — F32A Depression, unspecified: Secondary | ICD-10-CM

## 2017-04-23 NOTE — Telephone Encounter (Signed)
Patient called stating she is having discharge but it seems normal, no odor. She states she does not feel as irritable as well. Still waiting for urine culture result.
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
## 2017-04-24 ENCOUNTER — Encounter: Payer: Self-pay | Admitting: *Deleted

## 2017-04-24 LAB — URINE CULTURE: Organism ID, Bacteria: NO GROWTH

## 2017-05-02 ENCOUNTER — Telehealth: Payer: Self-pay | Admitting: *Deleted

## 2017-05-02 NOTE — Telephone Encounter (Signed)
Patient called stating mold was discovered in her house and she wanted to know if she should stay out of the house. Informed patient that it would be best if she could to be on the safe side. Verbalized understanding.

## 2017-05-02 NOTE — Telephone Encounter (Signed)
Erroneous encounter

## 2017-05-05 ENCOUNTER — Ambulatory Visit (INDEPENDENT_AMBULATORY_CARE_PROVIDER_SITE_OTHER): Payer: BLUE CROSS/BLUE SHIELD

## 2017-05-05 ENCOUNTER — Other Ambulatory Visit: Payer: BLUE CROSS/BLUE SHIELD

## 2017-05-05 ENCOUNTER — Encounter: Payer: Self-pay | Admitting: Obstetrics & Gynecology

## 2017-05-05 ENCOUNTER — Ambulatory Visit (INDEPENDENT_AMBULATORY_CARE_PROVIDER_SITE_OTHER): Payer: BLUE CROSS/BLUE SHIELD | Admitting: Obstetrics & Gynecology

## 2017-05-05 VITALS — BP 110/80 | HR 76 | Wt 199.0 lb

## 2017-05-05 DIAGNOSIS — Z3682 Encounter for antenatal screening for nuchal translucency: Secondary | ICD-10-CM

## 2017-05-05 DIAGNOSIS — Z331 Pregnant state, incidental: Secondary | ICD-10-CM

## 2017-05-05 DIAGNOSIS — Z3402 Encounter for supervision of normal first pregnancy, second trimester: Secondary | ICD-10-CM

## 2017-05-05 DIAGNOSIS — Z3481 Encounter for supervision of other normal pregnancy, first trimester: Secondary | ICD-10-CM

## 2017-05-05 DIAGNOSIS — Z1389 Encounter for screening for other disorder: Secondary | ICD-10-CM

## 2017-05-05 LAB — POCT URINALYSIS DIPSTICK
Blood, UA: NEGATIVE
Glucose, UA: NEGATIVE
KETONES UA: NEGATIVE
LEUKOCYTES UA: NEGATIVE
Nitrite, UA: NEGATIVE
PROTEIN UA: NEGATIVE

## 2017-05-05 NOTE — Progress Notes (Signed)
NT ultrasound today at 12+[redacted] weeks GA.  Single, active fetus with FHR 166 bpm. CRL measures 51.1 mm which is consistent with dating. NT measures 1.4 mm and nasal bone is present. Bilateral ovaries appear normal.

## 2017-05-05 NOTE — Progress Notes (Signed)
[id number] [redacted]w[redacted]d Estimated Date of Delivery: 11/13/17  Blood pressure 110/80, pulse 76, weight 199 lb (90.3 kg), last menstrual period 02/06/2017.   BP weight and urine results all reviewed and noted.  Please refer to the obstetrical flow sheet for the fundal height and fetal heart rate documentation:  Patient reports good fetal movement, denies any bleeding and no rupture of membranes symptoms or regular contractions. Patient is without complaints. All questions were answered.  Orders Placed This Encounter  Procedures  . Maternal Screen, Integrated #1  . POCT urinalysis dipstick    Plan:  Continued routine obstetrical care, NT normal sonogram, NB present  Return in about 4 weeks (around 06/02/2017) for LROB.

## 2017-05-06 ENCOUNTER — Ambulatory Visit (INDEPENDENT_AMBULATORY_CARE_PROVIDER_SITE_OTHER): Payer: BLUE CROSS/BLUE SHIELD | Admitting: Psychiatry

## 2017-05-06 ENCOUNTER — Encounter (HOSPITAL_COMMUNITY): Payer: Self-pay | Admitting: Psychiatry

## 2017-05-06 DIAGNOSIS — F909 Attention-deficit hyperactivity disorder, unspecified type: Secondary | ICD-10-CM | POA: Diagnosis not present

## 2017-05-06 DIAGNOSIS — F329 Major depressive disorder, single episode, unspecified: Secondary | ICD-10-CM | POA: Diagnosis not present

## 2017-05-06 DIAGNOSIS — F32A Depression, unspecified: Secondary | ICD-10-CM
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
## 2017-05-07 LAB — MATERNAL SCREEN, INTEGRATED #1
CROWN RUMP LENGTH MAT SCREEN: 51 mm
Gest. Age on Collection Date: 11.9 weeks
Maternal Age at EDD: 34.1 yr
Nuchal Translucency (NT): 1.4 mm
Number of Fetuses: 1
PAPP-A VALUE: 211.9 ng/mL
WEIGHT: 199 [lb_av]

## 2017-05-09 ENCOUNTER — Telehealth: Payer: Self-pay | Admitting: *Deleted

## 2017-05-09 ENCOUNTER — Other Ambulatory Visit: Payer: Self-pay | Admitting: Obstetrics & Gynecology

## 2017-05-09 ENCOUNTER — Encounter: Payer: Self-pay | Admitting: Obstetrics & Gynecology

## 2017-05-09 DIAGNOSIS — M542 Cervicalgia: Secondary | ICD-10-CM | POA: Diagnosis not present

## 2017-05-09 DIAGNOSIS — M9901 Segmental and somatic dysfunction of cervical region: Secondary | ICD-10-CM | POA: Diagnosis not present

## 2017-05-09 DIAGNOSIS — M545 Low back pain: Secondary | ICD-10-CM | POA: Diagnosis not present

## 2017-05-09 DIAGNOSIS — M9903 Segmental and somatic dysfunction of lumbar region: Secondary | ICD-10-CM | POA: Diagnosis not present

## 2017-05-09 MED ORDER — AZITHROMYCIN 250 MG PO TABS: ORAL_TABLET | ORAL | 0 refills | Status: DC

## 2017-05-09 MED ORDER — DEXTROMETHORPHAN-GUAIFENESIN 10-100 MG/5ML PO LIQD: 5.0000 mL | ORAL | 0 refills | Status: DC | PRN

## 2017-05-09 NOTE — Telephone Encounter (Signed)
Patient called stating she is still having congestion despite taking Robitussin and now is coughing up green mucous. She is coughing so much her throat is raw. Please advise.

## 2017-05-11 ENCOUNTER — Encounter (HOSPITAL_COMMUNITY): Payer: Self-pay | Admitting: *Deleted

## 2017-05-11 ENCOUNTER — Emergency Department (HOSPITAL_COMMUNITY)
Admission: EM | Admit: 2017-05-11 | Discharge: 2017-05-11 | Disposition: A | Discharge status: Home / Self Care | Payer: BLUE CROSS/BLUE SHIELD | Attending: Emergency Medicine | Admitting: Emergency Medicine

## 2017-05-11 ENCOUNTER — Emergency Department (HOSPITAL_COMMUNITY): Payer: BLUE CROSS/BLUE SHIELD

## 2017-05-11 DIAGNOSIS — O99511 Diseases of the respiratory system complicating pregnancy, first trimester: Secondary | ICD-10-CM | POA: Insufficient documentation

## 2017-05-11 DIAGNOSIS — F909 Attention-deficit hyperactivity disorder, unspecified type: Secondary | ICD-10-CM | POA: Diagnosis not present

## 2017-05-11 DIAGNOSIS — Z79899 Other long term (current) drug therapy: Secondary | ICD-10-CM | POA: Insufficient documentation

## 2017-05-11 DIAGNOSIS — J40 Bronchitis, not specified as acute or chronic: Secondary | ICD-10-CM

## 2017-05-11 DIAGNOSIS — Z3A13 13 weeks gestation of pregnancy: Secondary | ICD-10-CM | POA: Insufficient documentation

## 2017-05-11 DIAGNOSIS — R05 Cough: Secondary | ICD-10-CM | POA: Diagnosis not present

## 2017-05-11 DIAGNOSIS — R079 Chest pain, unspecified: Secondary | ICD-10-CM | POA: Diagnosis not present

## 2017-05-11 DIAGNOSIS — R0602 Shortness of breath: Secondary | ICD-10-CM | POA: Diagnosis not present

## 2017-05-11 LAB — INFLUENZA PANEL BY PCR (TYPE A & B)
INFLAPCR: NEGATIVE
INFLBPCR: NEGATIVE

## 2017-05-11 MED ORDER — HYDROCODONE-HOMATROPINE 5-1.5 MG/5ML PO SYRP: 5.0000 mL | ORAL_SOLUTION | ORAL | 0 refills | Status: DC | PRN

## 2017-05-11 MED ORDER — HYDROCODONE-HOMATROPINE 5-1.5 MG/5ML PO SYRP
5.0000 mL | ORAL_SOLUTION | Freq: Once | ORAL | Status: AC
  Administered 2017-05-11: 5 mL via ORAL
  Filled 2017-05-11: qty 5

## 2017-05-11 MED ORDER — DIPHENHYDRAMINE HCL 12.5 MG/5ML PO ELIX
12.5000 mg | ORAL_SOLUTION | Freq: Once | ORAL | Status: AC
Start: 2017-05-11 — End: 2017-05-11
  Administered 2017-05-11: 12.5 mg via ORAL
  Filled 2017-05-11: qty 5

## 2017-05-11 MED ORDER — ALBUTEROL SULFATE HFA 108 (90 BASE) MCG/ACT IN AERS
2.0000 | INHALATION_SPRAY | Freq: Once | RESPIRATORY_TRACT | Status: AC
  Administered 2017-05-11: 2 via RESPIRATORY_TRACT
  Filled 2017-05-11: qty 6.7

## 2017-05-11 NOTE — ED Notes (Signed)
From xray

## 2017-05-11 NOTE — ED Triage Notes (Signed)
Pt c/o thin green productive cough, decreased temperature, headache over the past week. Pt was placed on Z-Pak on Friday by Dr. Elonda Husky, pt's OB. Pt is [redacted] weeks pregnant and last appt with OB was last week and everything is normal with pregnancy at this point per pt.

## 2017-05-12 ENCOUNTER — Telehealth: Payer: Self-pay | Admitting: *Deleted

## 2017-05-12 ENCOUNTER — Telehealth: Payer: Self-pay | Admitting: Women's Health

## 2017-05-12 NOTE — Telephone Encounter (Signed)
Patient called stating she is having terrible nausea and drowsiness with the Hycodan. She would like something else for the cough if possible. She sent you a mychart message as well so you can send your response to her if you like. Please advise.

## 2017-05-12 NOTE — ED Provider Notes (Signed)
[facility] DEPT Provider Note   CSN: [id number] Arrival date & time: 05/11/17  1229     History   Chief Complaint Chief Complaint  Patient presents with  . Cough    HPI Donna Kim is a 36 y.o. female.   Cough  This is a new problem. The current episode started more than 1 week ago. The problem occurs every few minutes. The problem has been gradually worsening. The cough is non-productive. There has been no fever. Associated symptoms include chest pain (when coughing). She has tried decongestants for the symptoms.    Past Medical History:  Diagnosis Date  . Back pain 11/12/2013  . Contraceptive management 08/12/2014  . Mental disorder    anxiety  . No pertinent past medical history   . Postpartum bleeding 07/15/2014  . Rosacea   . UTI (urinary tract infection)   . Vaginal Pap smear, abnormal     Patient Active Problem List   Diagnosis Date Noted  . GBS bacteriuria 04/16/2017  . Supervision of normal pregnancy 04/14/2017  . Anxiety 04/14/2017  . Depression, postpartum, postpartum condition 12/17/2014  . Insomnia 12/17/2014  . ADHD (attention deficit hyperactivity disorder), inattentive type 10/02/2014  . Rosacea, acne 03/18/2014    Past Surgical History:  Procedure Laterality Date  . WISDOM TOOTH EXTRACTION      OB History    Gravida Para Term Preterm AB Living   3 2 2     2    SAB TAB Ectopic Multiple Live Births           2       Home Medications    Prior to Admission medications   Medication Sig Start Date End Date Taking? Authorizing Provider  azithromycin (ZITHROMAX Z-PAK) 250 MG tablet Take 2 tablets today and then 1 a day til finished 05/09/17  Yes Eure, Mertie Clause, MD  dextromethorphan-guaiFENesin (TUSSIN DM) 10-100 MG/5ML liquid Take 5 mLs by mouth every 4 (four) hours as needed for cough. 05/09/17  Yes Florian Buff, MD  magnesium oxide (MAG-OX) 400 MG tablet Take 200 mg by mouth daily.   Yes [provider]  Prenatal Vit-Fe  Fumarate-FA (PRENATAL MULTIVITAMIN) TABS tablet Take 1 tablet by mouth daily at 12 noon.   Yes [provider]  Probiotic Product (PROBIOTIC DAILY PO) Take by mouth.   Yes [provider]  DHA-EPA-Vitamin E (OMEGA-3 COMPLEX PO) Take by mouth.    [provider]  HYDROcodone-homatropine (HYCODAN) 5-1.5 MG/5ML syrup Take 5 mLs by mouth every 4 (four) hours as needed for cough. 05/11/17   Maybel Dambrosio, Corene Cornea, MD    Family History Family History  Problem Relation Age of Onset  . Adopted: Yes  . ADD / ADHD Daughter     Social History Social History  Substance Use Topics  . Smoking status: Never Smoker  . Smokeless tobacco: Never Used  . Alcohol use No     Comment: occasionally wine     Allergies   Cefzil [cefprozil]   Review of Systems Review of Systems  Respiratory: Positive for cough.   Cardiovascular: Positive for chest pain (when coughing).  All other systems reviewed and are negative.    Physical Exam Updated Vital Signs BP (!) 111/99   Pulse 97   Temp 98.2 F (36.8 C) (Oral)   Resp 18   Ht 5\' 5"  (1.651 m)   Wt 90.3 kg (199 lb)   LMP 02/06/2017 (Exact Date)   SpO2 98%   BMI 33.12 kg/m  Physical Exam  Constitutional: She appears well-developed and well-nourished.  HENT:  Head: Normocephalic and atraumatic.  Eyes: Conjunctivae and EOM are normal.  Neck: Normal range of motion.  Cardiovascular: Normal rate and regular rhythm.   No murmur heard. Pulmonary/Chest: Effort normal. No stridor. No respiratory distress. She has wheezes (mild rLL).  Abdominal: Soft. She exhibits no distension. There is no tenderness.  Musculoskeletal: Normal range of motion. She exhibits no edema or deformity.  Neurological: She is alert.  Skin: Skin is warm and dry.  Nursing note and vitals reviewed.    ED Treatments / Results  Labs (all labs ordered are listed, but only abnormal results are displayed) Labs Reviewed  INFLUENZA PANEL BY PCR (TYPE A & B)      EKG  EKG Interpretation None       Radiology Dg Chest 2 View  Result Date: 05/11/2017 CLINICAL DATA:  Cough with shortness of breath and chest pain EXAM: CHEST  2 VIEW COMPARISON:  November 30, 2009 FINDINGS: Lungs are clear. Heart size and pulmonary vascularity are normal. No adenopathy. No bone lesions. No pneumothorax. IMPRESSION: No edema or consolidation. Electronically Signed   By: Lowella Grip III M.D.   On: 05/11/2017 14:01    Procedures Procedures (including critical care time)  Medications Ordered in ED Medications  albuterol (PROVENTIL HFA;VENTOLIN HFA) 108 (90 Base) MCG/ACT inhaler 2 puff (2 puffs Inhalation Given 05/11/17 1341)  HYDROcodone-homatropine (HYCODAN) 5-1.5 MG/5ML syrup 5 mL (5 mLs Oral Given 05/11/17 1537)  diphenhydrAMINE (BENADRYL) 12.5 MG/5ML elixir 12.5 mg (12.5 mg Oral Given 05/11/17 1537)     Initial Impression / Assessment and Plan / ED Course  I have reviewed the triage vital signs and the nursing notes.  Pertinent labs & imaging results that were available during my care of the patient were reviewed by me and considered in my medical decision making (see chart for details).     Likely bronchitis. Already on azithromycin, taking decongestants. Will start hycodan per on call obstetrician recommendations and hold on prednisone as well. Will continue to follow with PCP/obstetrician.   Final Clinical Impressions(s) / ED Diagnoses   Final diagnoses:  Bronchitis    New Prescriptions Discharge Medication List as of 05/11/2017  3:27 PM    START taking these medications   Details  HYDROcodone-homatropine (HYCODAN) 5-1.5 MG/5ML syrup Take 5 mLs by mouth every 4 (four) hours as needed for cough., Starting Sun 05/11/2017, Print         Jenavie Stanczak, Corene Cornea, MD 05/12/17 1011

## 2017-05-12 NOTE — Telephone Encounter (Signed)
Mychart message sent.

## 2017-05-20 ENCOUNTER — Ambulatory Visit (INDEPENDENT_AMBULATORY_CARE_PROVIDER_SITE_OTHER): Payer: BLUE CROSS/BLUE SHIELD | Admitting: Psychiatry

## 2017-05-20 ENCOUNTER — Encounter (HOSPITAL_COMMUNITY): Payer: Self-pay | Admitting: Psychiatry

## 2017-05-20 DIAGNOSIS — F329 Major depressive disorder, single episode, unspecified: Secondary | ICD-10-CM

## 2017-05-20 DIAGNOSIS — F909 Attention-deficit hyperactivity disorder, unspecified type: Secondary | ICD-10-CM

## 2017-05-20 DIAGNOSIS — F32A Depression, unspecified: Secondary | ICD-10-CM
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
## 2017-05-26 ENCOUNTER — Ambulatory Visit (INDEPENDENT_AMBULATORY_CARE_PROVIDER_SITE_OTHER): Payer: Self-pay | Admitting: Otolaryngology

## 2017-06-03 ENCOUNTER — Ambulatory Visit (INDEPENDENT_AMBULATORY_CARE_PROVIDER_SITE_OTHER): Payer: BLUE CROSS/BLUE SHIELD | Admitting: Psychiatry

## 2017-06-03 ENCOUNTER — Encounter (HOSPITAL_COMMUNITY): Payer: Self-pay | Admitting: Psychiatry

## 2017-06-03 DIAGNOSIS — F909 Attention-deficit hyperactivity disorder, unspecified type: Secondary | ICD-10-CM

## 2017-06-03 DIAGNOSIS — F329 Major depressive disorder, single episode, unspecified: Secondary | ICD-10-CM

## 2017-06-03 DIAGNOSIS — F32A Depression, unspecified: Secondary | ICD-10-CM
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
## 2017-06-11 ENCOUNTER — Other Ambulatory Visit: Payer: Self-pay | Admitting: Obstetrics & Gynecology

## 2017-06-11 DIAGNOSIS — Z363 Encounter for antenatal screening for malformations: Secondary | ICD-10-CM

## 2017-06-12 ENCOUNTER — Encounter: Payer: Self-pay | Admitting: Obstetrics & Gynecology

## 2017-06-12 ENCOUNTER — Ambulatory Visit (INDEPENDENT_AMBULATORY_CARE_PROVIDER_SITE_OTHER): Payer: BLUE CROSS/BLUE SHIELD

## 2017-06-12 ENCOUNTER — Ambulatory Visit (INDEPENDENT_AMBULATORY_CARE_PROVIDER_SITE_OTHER): Payer: BLUE CROSS/BLUE SHIELD | Admitting: Obstetrics & Gynecology

## 2017-06-12 VITALS — BP 112/66 | HR 76 | Wt 204.0 lb

## 2017-06-12 DIAGNOSIS — Z3482 Encounter for supervision of other normal pregnancy, second trimester: Secondary | ICD-10-CM

## 2017-06-12 DIAGNOSIS — Z1379 Encounter for other screening for genetic and chromosomal anomalies: Secondary | ICD-10-CM

## 2017-06-12 DIAGNOSIS — Z3402 Encounter for supervision of normal first pregnancy, second trimester: Secondary | ICD-10-CM

## 2017-06-12 DIAGNOSIS — Z3A18 18 weeks gestation of pregnancy: Secondary | ICD-10-CM

## 2017-06-12 DIAGNOSIS — Z363 Encounter for antenatal screening for malformations: Secondary | ICD-10-CM | POA: Diagnosis not present

## 2017-06-12 DIAGNOSIS — Z1389 Encounter for screening for other disorder: Secondary | ICD-10-CM

## 2017-06-12 DIAGNOSIS — Z331 Pregnant state, incidental: Secondary | ICD-10-CM

## 2017-06-12 LAB — POCT URINALYSIS DIPSTICK
Blood, UA: NEGATIVE
GLUCOSE UA: NEGATIVE
KETONES UA: NEGATIVE
LEUKOCYTES UA: NEGATIVE
NITRITE UA: NEGATIVE
Protein, UA: NEGATIVE

## 2017-06-12 NOTE — Progress Notes (Signed)
[id number] [redacted]w[redacted]d Estimated Date of Delivery: 11/13/17  Blood pressure 112/66, pulse 76, weight 204 lb (92.5 kg), last menstrual period 02/06/2017.   BP weight and urine results all reviewed and noted.  Please refer to the obstetrical flow sheet for the fundal height and fetal heart rate documentation:  Patient reports good fetal movement, denies any bleeding and no rupture of membranes symptoms or regular contractions. Patient is without complaints. All questions were answered.  Orders Placed This Encounter  Procedures  . US OB Limited  . POCT urinalysis dipstick    Plan:  Continued routine obstetrical care, sonogram is normal, sub optimal views of spine will repeat in 4 weeks for re eval  2nd IT today  Return in about 4 weeks (around 07/10/2017) for limited sonogram,, LROB.

## 2017-06-12 NOTE — Progress Notes (Signed)
[place] 18 wks,cephalic,post pl gr 0,normal ovaries bilat,cx 3.8 cm,svp of fluid 3.7 cm,fhr 141 bpm,efw 190 g,limited view of spine because of fetal position,please have [place].[facility] back for additional images,no obvious abnormalities seen

## 2017-06-17 ENCOUNTER — Ambulatory Visit (HOSPITAL_COMMUNITY): Payer: Self-pay | Admitting: Psychiatry

## 2017-06-23 LAB — MATERNAL SCREEN, INTEGRATED #2
ADSF: 1.48
AFP MOM: 0.57
Alpha-Fetoprotein: 16.8 ng/mL
Crown Rump Length: 51 mm
DIA MOM: 0.96
DIA VALUE: 134.4 pg/mL
ESTRIOL UNCONJUGATED: 1.38 ng/mL
GEST. AGE ON COLLECTION DATE: 11.9 wk
Gestational Age: 17.3 weeks
Maternal Age at EDD: 34.1 yr
NUCHAL TRANSLUCENCY (NT): 1.4 mm
NUCHAL TRANSLUCENCY MOM: 1.02
NUMBER OF FETUSES: 1
PAPP-A MoM: 0.41
PAPP-A Value: 211.9 ng/mL
Test Results:: NEGATIVE
WEIGHT: 199 [lb_av]
Weight: 199 [lb_av]
hCG MoM: 0.44
hCG Value: 9.6 IU/mL

## 2017-06-26 ENCOUNTER — Ambulatory Visit (INDEPENDENT_AMBULATORY_CARE_PROVIDER_SITE_OTHER): Payer: BLUE CROSS/BLUE SHIELD | Admitting: Otolaryngology

## 2017-06-26 DIAGNOSIS — H9311 Tinnitus, right ear: Secondary | ICD-10-CM

## 2017-06-26 DIAGNOSIS — H93293 Other abnormal auditory perceptions, bilateral: Secondary | ICD-10-CM

## 2017-07-01 ENCOUNTER — Ambulatory Visit (INDEPENDENT_AMBULATORY_CARE_PROVIDER_SITE_OTHER): Payer: BLUE CROSS/BLUE SHIELD | Admitting: Psychiatry

## 2017-07-01 ENCOUNTER — Encounter (HOSPITAL_COMMUNITY): Payer: Self-pay | Admitting: Psychiatry

## 2017-07-01 DIAGNOSIS — F909 Attention-deficit hyperactivity disorder, unspecified type: Secondary | ICD-10-CM | POA: Diagnosis not present
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
## 2017-07-07 ENCOUNTER — Other Ambulatory Visit: Payer: Self-pay | Admitting: Obstetrics & Gynecology

## 2017-07-07 DIAGNOSIS — Z0489 Encounter for examination and observation for other specified reasons: Secondary | ICD-10-CM

## 2017-07-07 DIAGNOSIS — IMO0002 Reserved for concepts with insufficient information to code with codable children: Secondary | ICD-10-CM

## 2017-07-09 DIAGNOSIS — M545 Low back pain: Secondary | ICD-10-CM | POA: Diagnosis not present

## 2017-07-09 DIAGNOSIS — M5413 Radiculopathy, cervicothoracic region: Secondary | ICD-10-CM | POA: Diagnosis not present

## 2017-07-09 DIAGNOSIS — M9903 Segmental and somatic dysfunction of lumbar region: Secondary | ICD-10-CM | POA: Diagnosis not present

## 2017-07-09 DIAGNOSIS — M9901 Segmental and somatic dysfunction of cervical region: Secondary | ICD-10-CM | POA: Diagnosis not present

## 2017-07-10 ENCOUNTER — Encounter: Payer: Self-pay | Admitting: Advanced Practice Midwife

## 2017-07-10 ENCOUNTER — Ambulatory Visit (INDEPENDENT_AMBULATORY_CARE_PROVIDER_SITE_OTHER): Payer: BLUE CROSS/BLUE SHIELD | Admitting: Advanced Practice Midwife

## 2017-07-10 ENCOUNTER — Ambulatory Visit (INDEPENDENT_AMBULATORY_CARE_PROVIDER_SITE_OTHER): Payer: BLUE CROSS/BLUE SHIELD

## 2017-07-10 VITALS — BP 100/62 | HR 74 | Wt 205.0 lb

## 2017-07-10 DIAGNOSIS — IMO0002 Reserved for concepts with insufficient information to code with codable children: Secondary | ICD-10-CM

## 2017-07-10 DIAGNOSIS — Z331 Pregnant state, incidental: Secondary | ICD-10-CM

## 2017-07-10 DIAGNOSIS — Z048 Encounter for examination and observation for other specified reasons: Secondary | ICD-10-CM | POA: Diagnosis not present

## 2017-07-10 DIAGNOSIS — Z3482 Encounter for supervision of other normal pregnancy, second trimester: Secondary | ICD-10-CM

## 2017-07-10 DIAGNOSIS — Z3A22 22 weeks gestation of pregnancy: Secondary | ICD-10-CM

## 2017-07-10 DIAGNOSIS — O283 Abnormal ultrasonic finding on antenatal screening of mother: Secondary | ICD-10-CM | POA: Diagnosis not present

## 2017-07-10 DIAGNOSIS — Z0489 Encounter for examination and observation for other specified reasons: Secondary | ICD-10-CM

## 2017-07-10 DIAGNOSIS — Z1389 Encounter for screening for other disorder: Secondary | ICD-10-CM

## 2017-07-10 DIAGNOSIS — Z3402 Encounter for supervision of normal first pregnancy, second trimester: Secondary | ICD-10-CM

## 2017-07-10 LAB — POCT URINALYSIS DIPSTICK
Blood, UA: NEGATIVE
GLUCOSE UA: NEGATIVE
Ketones, UA: NEGATIVE
LEUKOCYTES UA: NEGATIVE
NITRITE UA: NEGATIVE
PROTEIN UA: NEGATIVE

## 2017-07-10 MED ORDER — OMEPRAZOLE 20 MG PO CPDR: 20.0000 mg | DELAYED_RELEASE_CAPSULE | Freq: Every day | ORAL | 6 refills | Status: DC

## 2017-07-10 NOTE — Progress Notes (Signed)
[place] 22 wks,breech,post pl gr 0,normal ovaries bilat,cx 3 cm,svp of fluid 6.6 cm,fhr 148 bpm,efw 443 g,32 %,spine anatomy complete,no obvious abnormalities

## 2017-07-10 NOTE — Patient Instructions (Signed)
zSecond Trimester of Pregnancy The second trimester is from week 14 through week 27 (months 4 through 6). The second trimester is often a time when you feel your best. Your body has adjusted to being pregnant, and you begin to feel better physically. Usually, morning sickness has lessened or quit completely, you may have more energy, and you may have an increase in appetite. The second trimester is also a time when the fetus is growing rapidly. At the end of the sixth month, the fetus is about 9 inches long and weighs about 1 pounds. You will likely begin to feel the baby move (quickening) between 16 and 20 weeks of pregnancy. Body changes during your second trimester Your body continues to go through many changes during your second trimester. The changes vary from woman to woman.  Your weight will continue to increase. You will notice your lower abdomen bulging out.  You may begin to get stretch marks on your hips, abdomen, and breasts.  You may develop headaches that can be relieved by medicines. The medicines should be approved by your health care provider.  You may urinate more often because the fetus is pressing on your bladder.  You may develop or continue to have heartburn as a result of your pregnancy.  You may develop constipation because certain hormones are causing the muscles that push waste through your intestines to slow down.  You may develop hemorrhoids or swollen, bulging veins (varicose veins).  You may have back pain. This is caused by: ? Weight gain. ? Pregnancy hormones that are relaxing the joints in your pelvis. ? A shift in weight and the muscles that support your balance.  Your breasts will continue to grow and they will continue to become tender.  Your gums may bleed and may be sensitive to brushing and flossing.  Dark spots or blotches (chloasma, mask of pregnancy) may develop on your face. This will likely fade after the baby is born.  A dark line from your  belly button to the pubic area (linea nigra) may appear. This will likely fade after the baby is born.  You may have changes in your hair. These can include thickening of your hair, rapid growth, and changes in texture. Some women also have hair loss during or after pregnancy, or hair that feels dry or thin. Your hair will most likely return to normal after your baby is born.  What to expect at prenatal visits During a routine prenatal visit:  You will be weighed to make sure you and the fetus are growing normally.  Your blood pressure will be taken.  Your abdomen will be measured to track your baby's growth.  The fetal heartbeat will be listened to.  Any test results from the previous visit will be discussed.  Your health care provider may ask you:  How you are feeling.  If you are feeling the baby move.  If you have had any abnormal symptoms, such as leaking fluid, bleeding, severe headaches, or abdominal cramping.  If you are using any tobacco products, including cigarettes, chewing tobacco, and electronic cigarettes.  If you have any questions.  Other tests that may be performed during your second trimester include:  Blood tests that check for: ? Low iron levels (anemia). ? High blood sugar that affects pregnant women (gestational diabetes) between 73 and 28 weeks. ? Rh antibodies. This is to check for a protein on red blood cells (Rh factor).  Urine tests to check for infections, diabetes, or  protein in the urine.  An ultrasound to confirm the proper growth and development of the baby.  An amniocentesis to check for possible genetic problems.  Fetal screens for spina bifida and Down syndrome.  HIV (human immunodeficiency virus) testing. Routine prenatal testing includes screening for HIV, unless you choose not to have this test.  Follow these instructions at home: Medicines  Follow your health care provider's instructions regarding medicine use. Specific  medicines may be either safe or unsafe to take during pregnancy.  Take a prenatal vitamin that contains at least 600 micrograms (mcg) of folic acid.  If you develop constipation, try taking a stool softener if your health care provider approves. Eating and drinking  Eat a balanced diet that includes fresh fruits and vegetables, whole grains, good sources of protein such as meat, eggs, or tofu, and low-fat dairy. Your health care provider will help you determine the amount of weight gain that is right for you.  Avoid raw meat and uncooked cheese. These carry germs that can cause birth defects in the baby.  If you have low calcium intake from food, talk to your health care provider about whether you should take a daily calcium supplement.  Limit foods that are high in fat and processed sugars, such as fried and sweet foods.  To prevent constipation: ? Drink enough fluid to keep your urine clear or pale yellow. ? Eat foods that are high in fiber, such as fresh fruits and vegetables, whole grains, and beans. Activity  Exercise only as directed by your health care provider. Most women can continue their usual exercise routine during pregnancy. Try to exercise for 30 minutes at least 5 days a week. Stop exercising if you experience uterine contractions.  Avoid heavy lifting, wear low heel shoes, and practice good posture.  A sexual relationship may be continued unless your health care provider directs you otherwise. Relieving pain and discomfort  Wear a good support bra to prevent discomfort from breast tenderness.  Take warm sitz baths to soothe any pain or discomfort caused by hemorrhoids. Use hemorrhoid cream if your health care provider approves.  Rest with your legs elevated if you have leg cramps or low back pain.  If you develop varicose veins, wear support hose. Elevate your feet for 15 minutes, 3-4 times a day. Limit salt in your diet. Prenatal Care  Write down your questions.  Take them to your prenatal visits.  Keep all your prenatal visits as told by your health care provider. This is important. Safety  Wear your seat belt at all times when driving.  Make a list of emergency phone numbers, including numbers for family, friends, the hospital, and police and fire departments. General instructions  Ask your health care provider for a referral to a local prenatal education class. Begin classes no later than the beginning of month 6 of your pregnancy.  Ask for help if you have counseling or nutritional needs during pregnancy. Your health care provider can offer advice or refer you to specialists for help with various needs.  Do not use hot tubs, steam rooms, or saunas.  Do not douche or use tampons or scented sanitary pads.  Do not cross your legs for long periods of time.  Avoid cat litter boxes and soil used by cats. These carry germs that can cause birth defects in the baby and possibly loss of the fetus by miscarriage or stillbirth.  Avoid all smoking, herbs, alcohol, and unprescribed drugs. Chemicals in these products can   affect the formation and growth of the baby.  Do not use any products that contain nicotine or tobacco, such as cigarettes and e-cigarettes. If you need help quitting, ask your health care provider.  Visit your dentist if you have not gone yet during your pregnancy. Use a soft toothbrush to brush your teeth and be gentle when you floss. Contact a health care provider if:  You have dizziness.  You have mild pelvic cramps, pelvic pressure, or nagging pain in the abdominal area.  You have persistent nausea, vomiting, or diarrhea.  You have a bad smelling vaginal discharge.  You have pain when you urinate. Get help right away if:  You have a fever.  You are leaking fluid from your vagina.  You have spotting or bleeding from your vagina.  You have severe abdominal cramping or pain.  You have rapid weight gain or weight  loss.  You have shortness of breath with chest pain.  You notice sudden or extreme swelling of your face, hands, ankles, feet, or legs.  You have not felt your baby move in over an hour.  You have severe headaches that do not go away when you take medicine.  You have vision changes. Summary  The second trimester is from week 14 through week 27 (months 4 through 6). It is also a time when the fetus is growing rapidly.  Your body goes through many changes during pregnancy. The changes vary from woman to woman.  Avoid all smoking, herbs, alcohol, and unprescribed drugs. These chemicals affect the formation and growth your baby.  Do not use any tobacco products, such as cigarettes, chewing tobacco, and e-cigarettes. If you need help quitting, ask your health care provider.  Contact your health care provider if you have any questions. Keep all prenatal visits as told by your health care provider. This is important. This information is not intended to replace advice given to you by your health care provider. Make sure you discuss any questions you have with your health care provider. Document Released: 11/12/2001 Document Revised: 04/25/2016 Document Reviewed: 01/19/2013 Elsevier Interactive Patient Education  2017 [employer].

## 2017-07-10 NOTE — Progress Notes (Signed)
[id number] [redacted]w[redacted]d Estimated Date of Delivery: 11/13/17  Blood pressure 100/62, pulse 74, weight 205 lb (93 kg), last menstrual period 02/06/2017.   BP weight and urine results all reviewed and noted.  Please refer to the obstetrical flow sheet for the fundal height and fetal heart rate documentation:  C/o cough for 2 months and extra mucus. ? Reflux   [place] 22 wks,breech,post pl gr 0,normal ovaries bilat,cx 3 cm,svp of fluid 6.6 cm,fhr 148 bpm,efw 443 g,32 %,spine anatomy complete,no obvious abnormalities   Patient reports good fetal movement, denies any bleeding and no rupture of membranes symptoms or regular contractions. Patient had adjustments with chiropractor yesterday for arm numbness and some LBP. Feels better today.  All questions were answered.  Orders Placed This Encounter  Procedures  . POCT urinalysis dipstick    Plan:  Continued routine obstetrical care, try prilosec`  Return in about 4 weeks (around 08/07/2017) for LROB.

## 2017-07-11 ENCOUNTER — Telehealth: Payer: Self-pay | Admitting: *Deleted

## 2017-07-11 DIAGNOSIS — M9903 Segmental and somatic dysfunction of lumbar region: Secondary | ICD-10-CM | POA: Diagnosis not present

## 2017-07-11 DIAGNOSIS — M5413 Radiculopathy, cervicothoracic region: Secondary | ICD-10-CM | POA: Diagnosis not present

## 2017-07-11 DIAGNOSIS — M545 Low back pain: Secondary | ICD-10-CM | POA: Diagnosis not present

## 2017-07-11 DIAGNOSIS — M9901 Segmental and somatic dysfunction of cervical region: Secondary | ICD-10-CM | POA: Diagnosis not present

## 2017-07-11 NOTE — Telephone Encounter (Signed)
No specific thoughts at this point, I would imagine her migraine is related to her fever having been up her urine was clear yesterday.  Fluids tylenol and see if anything else develops

## 2017-07-11 NOTE — Telephone Encounter (Signed)
Spoke with pt. Pt has had migraine since 3 pm yesterday. + back pain and fever of 102 last night. Pt does have a history of migraines but not this bad. Taking Excedrin without aspirin and Tylenol, but it's not helping. I advised migraine wouldn't cause back pain and fever. Continue Tylenol for fever. Advised to drink plenty of fluids and let us know if anything changes. Call after hours nurse line if need be. Can you order something for migraine? Thanks!! [place]

## 2017-07-11 NOTE — Telephone Encounter (Signed)
Pt called back stating she was having pain with urination. Urinary frequency also, but has been drinking lots of water. Please advise. Thanks!! [place]

## 2017-07-11 NOTE — Telephone Encounter (Signed)
Patient called back stating that she spoke with Marcie Bal not to long ago and she forgot to ask her a question. Please contact pt

## 2017-07-11 NOTE — Telephone Encounter (Signed)
Left message regarding Dr. Brynda Greathouse advice. Advised can call after hours nurse line if any new symptoms develop. [place]

## 2017-07-12 ENCOUNTER — Encounter (HOSPITAL_COMMUNITY): Payer: Self-pay

## 2017-07-12 ENCOUNTER — Inpatient Hospital Stay (HOSPITAL_COMMUNITY)
Admission: AD | Admit: 2017-07-12 | Discharge: 2017-07-12 | Disposition: A | Discharge status: Home / Self Care | Payer: BLUE CROSS/BLUE SHIELD | Source: Ambulatory Visit | Attending: Family Medicine | Admitting: Family Medicine

## 2017-07-12 DIAGNOSIS — O99712 Diseases of the skin and subcutaneous tissue complicating pregnancy, second trimester: Secondary | ICD-10-CM | POA: Diagnosis not present

## 2017-07-12 DIAGNOSIS — O99342 Other mental disorders complicating pregnancy, second trimester: Secondary | ICD-10-CM | POA: Insufficient documentation

## 2017-07-12 DIAGNOSIS — O26892 Other specified pregnancy related conditions, second trimester: Secondary | ICD-10-CM | POA: Insufficient documentation

## 2017-07-12 DIAGNOSIS — F909 Attention-deficit hyperactivity disorder, unspecified type: Secondary | ICD-10-CM | POA: Diagnosis not present

## 2017-07-12 DIAGNOSIS — Z818 Family history of other mental and behavioral disorders: Secondary | ICD-10-CM | POA: Diagnosis not present

## 2017-07-12 DIAGNOSIS — R509 Fever, unspecified: Secondary | ICD-10-CM | POA: Insufficient documentation

## 2017-07-12 DIAGNOSIS — F419 Anxiety disorder, unspecified: Secondary | ICD-10-CM | POA: Diagnosis not present

## 2017-07-12 DIAGNOSIS — L719 Rosacea, unspecified: Secondary | ICD-10-CM | POA: Insufficient documentation

## 2017-07-12 DIAGNOSIS — Z3A22 22 weeks gestation of pregnancy: Secondary | ICD-10-CM | POA: Insufficient documentation

## 2017-07-12 DIAGNOSIS — N3000 Acute cystitis without hematuria: Secondary | ICD-10-CM

## 2017-07-12 HISTORY — DX: Attention-deficit hyperactivity disorder, unspecified type: F90.9

## 2017-07-12 LAB — URINALYSIS, ROUTINE W REFLEX MICROSCOPIC
BILIRUBIN URINE: NEGATIVE
Glucose, UA: NEGATIVE mg/dL
Hgb urine dipstick: NEGATIVE
Ketones, ur: NEGATIVE mg/dL
Nitrite: NEGATIVE
PROTEIN: 30 mg/dL — AB
SPECIFIC GRAVITY, URINE: 1.027 (ref 1.005–1.030)
pH: 5 (ref 5.0–8.0)

## 2017-07-12 LAB — CBC WITH DIFFERENTIAL/PLATELET
BASOS ABS: 0 10*3/uL (ref 0.0–0.1)
Basophils Relative: 0 %
EOS ABS: 0 10*3/uL (ref 0.0–0.7)
EOS PCT: 0 %
HCT: 37.2 % (ref 36.0–46.0)
HEMOGLOBIN: 12.7 g/dL (ref 12.0–15.0)
LYMPHS PCT: 21 %
Lymphs Abs: 2.2 10*3/uL (ref 0.7–4.0)
MCH: 30.3 pg (ref 26.0–34.0)
MCHC: 34.1 g/dL (ref 30.0–36.0)
MCV: 88.8 fL (ref 78.0–100.0)
Monocytes Absolute: 0.3 10*3/uL (ref 0.1–1.0)
Monocytes Relative: 3 %
NEUTROS PCT: 77 %
Neutro Abs: 8.1 10*3/uL — ABNORMAL HIGH (ref 1.7–7.7)
PLATELETS: 113 10*3/uL — AB (ref 150–400)
RBC: 4.19 MIL/uL (ref 3.87–5.11)
RDW: 14.2 % (ref 11.5–15.5)
WBC: 10.6 10*3/uL — AB (ref 4.0–10.5)

## 2017-07-12 MED ORDER — NITROFURANTOIN MONOHYD MACRO 100 MG PO CAPS: 100.0000 mg | ORAL_CAPSULE | Freq: Two times a day (BID) | ORAL | 0 refills | Status: DC

## 2017-07-12 NOTE — Discharge Instructions (Signed)
Acute Urinary Retention, Female Urinary retention means you are unable to pee completely or at all (empty your bladder). Follow these instructions at home:  Drink enough fluids to keep your pee (urine) clear or pale yellow.  If you are sent home with a tube that drains the bladder (catheter), there will be a drainage bag attached to it. There are two types of bags. One is big that you can wear at night without having to empty it. One is smaller and needs to be emptied more often.  Keep the drainage bag emptied.  Keep the drainage bag lower than the tube.  Only take medicine as told by your doctor. Contact a doctor if:  You have a low-grade fever.  You have spasms or you are leaking pee when you have spasms. Get help right away if:  You have chills or a fever.  Your catheter stops draining pee.  Your catheter falls out.  You have increased bleeding that does not stop after you have rested and increased the amount of fluids you had been drinking. This information is not intended to replace advice given to you by your health care provider. Make sure you discuss any questions you have with your health care provider. Document Released: 05/06/2008 Document Revised: 04/25/2016 Document Reviewed: 04/29/2013 Elsevier Interactive Patient Education  2017 [employer]  

## 2017-07-12 NOTE — MAU Note (Signed)
Fever started Thurs night.  Highest 102.7- documented.  Kind of broke this morning, last was 99.3.  Fever and chills.  Slight cough, but not a new problem, MD thinks is related to acid reflux.  Had a migraine on Thursday, slight ha now. Is feeling weak, ? Dehydrated.  Feels somewhat SOB. (pt talking non-stop and fast- o2 sat is 100).

## 2017-07-12 NOTE — MAU Provider Note (Signed)
History   G3P2002 @ 22wks  In with Fever off and on since Thursday night. No other symptoms except she" feels bad"   CSN: [id number]  Arrival date & time 07/12/17  1316   None     Chief Complaint  Patient presents with  . Fever  . Headache    HPI  Past Medical History:  Diagnosis Date  . ADHD   . Back pain 11/12/2013  . Contraceptive management 08/12/2014  . Mental disorder    anxiety  . No pertinent past medical history   . Postpartum bleeding 07/15/2014  . Rosacea   . UTI (urinary tract infection)   . Vaginal Pap smear, abnormal     Past Surgical History:  Procedure Laterality Date  . WISDOM TOOTH EXTRACTION      Family History  Problem Relation Age of Onset  . Adopted: Yes  . ADD / ADHD Daughter     Social History  Substance Use Topics  . Smoking status: Never Smoker  . Smokeless tobacco: Never Used  . Alcohol use No     Comment: occasionally wine    OB History    Gravida Para Term Preterm AB Living   3 2 2     2    SAB TAB Ectopic Multiple Live Births           2      Review of Systems  Constitutional: Positive for chills and fever.  HENT: Negative.   Eyes: Negative.   Respiratory: Negative.   Cardiovascular: Negative.   Gastrointestinal: Negative.   Endocrine: Negative.   Genitourinary: Negative.   Musculoskeletal: Negative.   Skin: Negative.   Allergic/Immunologic: Negative.   Neurological: Negative.   Hematological: Negative.   Psychiatric/Behavioral: Negative.     Allergies  Cefzil [cefprozil]  Home Medications   Current Outpatient Rx  . Order #: [id number] Class: Normal    BP 126/72 (BP Location: Right Arm)   Pulse (!) 109   Temp 100.2 F (37.9 C) (Oral)   Resp (!) 22   Wt 203 lb 12 oz (92.4 kg)   LMP 02/06/2017 (Exact Date)   SpO2 99%   BMI 33.91 kg/m   Physical Exam  Constitutional: She is oriented to person, place, and time. She appears well-developed.  HENT:  Head: Normocephalic.  Neck: Normal range of motion.   Cardiovascular: Normal rate, regular rhythm, normal heart sounds and intact distal pulses.   Pulmonary/Chest: Effort normal and breath sounds normal.  Abdominal: Soft. Bowel sounds are normal.  Genitourinary: Vagina normal and uterus normal.  Musculoskeletal: Normal range of motion.  Neurological: She is alert and oriented to person, place, and time. She has normal reflexes.  Skin: Skin is warm and dry.  Psychiatric: She has a normal mood and affect. Her behavior is normal. Judgment and thought content normal.    MAU Course  Procedures (including critical care time)  Labs Reviewed  URINALYSIS, ROUTINE W REFLEX MICROSCOPIC - Abnormal; Notable for the following:       Result Value   Color, Urine AMBER (*)    APPearance CLOUDY (*)    Protein, ur 30 (*)    Leukocytes, UA MODERATE (*)    Bacteria, UA MANY (*)    Squamous Epithelial / LPF 6-30 (*)    All other components within normal limits  CBC WITH DIFFERENTIAL/PLATELET - Abnormal; Notable for the following:    WBC 10.6 (*)    Platelets 113 (*)    Neutro Abs 8.1 (*)  All other components within normal limits  URINE CULTURE   No results found.   1. Acute cystitis without hematuria       MDM  Urine shows many WBC's, CBC stable, FHR per doppler st and reg. abd soft non tender. No CVAT. Given pt states she has hx of UTI's will treat with macrobid and culture urine. To follow up at [facility] next week regarding results of culture.

## 2017-07-13 LAB — URINE CULTURE

## 2017-07-14 ENCOUNTER — Telehealth: Payer: Self-pay | Admitting: Obstetrics & Gynecology

## 2017-07-14 NOTE — Telephone Encounter (Signed)
Patient called stating that she went to the ER this weekend because she had a bad UTI. Pt states that this morning she is in a lot of pain and pressure. I let patient know we do not have any opening on our schedule for this week. Please contact pt

## 2017-07-14 NOTE — Telephone Encounter (Signed)
Pt called stating that she was seen at [facility] on Saturday and treated for a UTI. She states that she was in a lot of pain this morning but it is better now. I advised pt to take the antibiotic as prescribed and symptoms should begin to improve. She also c/o constipation. I advised pt of safe medications to take such as colace, metamucil, milk of mag, amd miralax. Advised pt she could also try warm prune and apple juice. She will follow up from her ED visit next week. Advised pt to call back if symptoms dont improve after the antibiotic or worsen. Pt verbalized understanding.

## 2017-07-16 ENCOUNTER — Ambulatory Visit (HOSPITAL_COMMUNITY): Payer: BLUE CROSS/BLUE SHIELD | Admitting: Psychiatry

## 2017-07-22 ENCOUNTER — Encounter: Payer: Self-pay | Admitting: Obstetrics & Gynecology

## 2017-07-22 ENCOUNTER — Ambulatory Visit (INDEPENDENT_AMBULATORY_CARE_PROVIDER_SITE_OTHER): Payer: BLUE CROSS/BLUE SHIELD | Admitting: Obstetrics & Gynecology

## 2017-07-22 VITALS — BP 114/68 | HR 86 | Wt 204.5 lb

## 2017-07-22 DIAGNOSIS — Z3482 Encounter for supervision of other normal pregnancy, second trimester: Secondary | ICD-10-CM

## 2017-07-22 DIAGNOSIS — Z3A23 23 weeks gestation of pregnancy: Secondary | ICD-10-CM

## 2017-07-22 DIAGNOSIS — Z1389 Encounter for screening for other disorder: Secondary | ICD-10-CM

## 2017-07-22 DIAGNOSIS — Z331 Pregnant state, incidental: Secondary | ICD-10-CM

## 2017-07-22 LAB — POCT URINALYSIS DIPSTICK
Blood, UA: NEGATIVE
Glucose, UA: NEGATIVE
Ketones, UA: NEGATIVE
Leukocytes, UA: NEGATIVE
Nitrite, UA: NEGATIVE
PROTEIN UA: NEGATIVE

## 2017-07-22 NOTE — Progress Notes (Signed)
[id number] [redacted]w[redacted]d Estimated Date of Delivery: 11/13/17  Blood pressure 114/68, pulse 86, weight 204 lb 8 oz (92.8 kg), last menstrual period 02/06/2017.   BP weight and urine results all reviewed and noted.  Please refer to the obstetrical flow sheet for the fundal height and fetal heart rate documentation:  Patient reports good fetal movement, denies any bleeding and no rupture of membranes symptoms or regular contractions. Patient is without complaints. All questions were answered.  Orders Placed This Encounter  Procedures  . POCT urinalysis dipstick    Plan:  Continued routine obstetrical care, PN2  Return in about 4 weeks (around 08/19/2017) for PN2, , LROB.

## 2017-07-28 ENCOUNTER — Ambulatory Visit (HOSPITAL_COMMUNITY): Payer: Self-pay | Admitting: Psychiatry

## 2017-07-29 ENCOUNTER — Ambulatory Visit (HOSPITAL_COMMUNITY): Payer: BLUE CROSS/BLUE SHIELD | Admitting: Psychiatry

## 2017-08-01 ENCOUNTER — Telehealth: Payer: Self-pay | Admitting: Obstetrics & Gynecology

## 2017-08-01 NOTE — Telephone Encounter (Signed)
Patient called stating that she is [redacted] weeks pregnant and that when she used the restroom today and she wiped there was a little bit of blood. Pt states that she would like a call back stating what she should do. Please contact pt

## 2017-08-02 DIAGNOSIS — K831 Obstruction of bile duct: Secondary | ICD-10-CM

## 2017-08-02 HISTORY — DX: Obstruction of bile duct: K83.1

## 2017-08-05 NOTE — Telephone Encounter (Signed)
Patient called stating she had some spotting last week but none since Friday. Denies cramping or leaking of fluid and currently baby is active. Advised patient to continue to monitor and if bleeding returns to call us back. Advised patient to not wait 4 days after bleeding to notify us. Verbalized understanding.

## 2017-08-07 ENCOUNTER — Ambulatory Visit (INDEPENDENT_AMBULATORY_CARE_PROVIDER_SITE_OTHER): Payer: BLUE CROSS/BLUE SHIELD | Admitting: Psychiatry

## 2017-08-07 ENCOUNTER — Encounter (HOSPITAL_COMMUNITY): Payer: Self-pay | Admitting: Psychiatry

## 2017-08-07 ENCOUNTER — Encounter: Payer: BLUE CROSS/BLUE SHIELD | Admitting: Obstetrics & Gynecology

## 2017-08-07 DIAGNOSIS — F909 Attention-deficit hyperactivity disorder, unspecified type: Secondary | ICD-10-CM
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
## 2017-08-11 ENCOUNTER — Ambulatory Visit (HOSPITAL_COMMUNITY): Payer: Self-pay | Admitting: Psychiatry

## 2017-08-19 ENCOUNTER — Encounter: Payer: Self-pay | Admitting: Women's Health

## 2017-08-19 ENCOUNTER — Ambulatory Visit (INDEPENDENT_AMBULATORY_CARE_PROVIDER_SITE_OTHER): Payer: BLUE CROSS/BLUE SHIELD | Admitting: Women's Health

## 2017-08-19 ENCOUNTER — Other Ambulatory Visit: Payer: BLUE CROSS/BLUE SHIELD

## 2017-08-19 VITALS — BP 108/62 | HR 94 | Wt 207.0 lb

## 2017-08-19 DIAGNOSIS — Z1389 Encounter for screening for other disorder: Secondary | ICD-10-CM

## 2017-08-19 DIAGNOSIS — Z131 Encounter for screening for diabetes mellitus: Secondary | ICD-10-CM | POA: Diagnosis not present

## 2017-08-19 DIAGNOSIS — Z3482 Encounter for supervision of other normal pregnancy, second trimester: Secondary | ICD-10-CM

## 2017-08-19 DIAGNOSIS — Z3A27 27 weeks gestation of pregnancy: Secondary | ICD-10-CM

## 2017-08-19 DIAGNOSIS — Z23 Encounter for immunization: Secondary | ICD-10-CM

## 2017-08-19 DIAGNOSIS — Z331 Pregnant state, incidental: Secondary | ICD-10-CM

## 2017-08-19 LAB — POCT URINALYSIS DIPSTICK
GLUCOSE UA: NEGATIVE
Ketones, UA: NEGATIVE
NITRITE UA: NEGATIVE
Protein, UA: NEGATIVE
RBC UA: NEGATIVE

## 2017-08-19 NOTE — Progress Notes (Signed)
   [facility] Pregnancy Visit  Patient name: Donna Kim MRN [id number]  Date of birth: 06/14/1981  Donna Kim is a 36 y.o. G45P2002 female at [redacted]w[redacted]d with an Estimated Date of Delivery: 11/13/17 being seen today for ongoing management of a low-risk pregnancy.   Vitals:   08/19/17 0951  BP: 108/62  Pulse: 94  Weight: 207 lb (93.9 kg)    Body mass index is 34.45 kg/m.  Reports good fm.  Denies regular uc's, lof, vb, or uti s/s. Itching mainly on soles of feet at night, some on palms. Has been fasting for pn2, but per S[place] Soda in lab- unable to add bile acids/cmp to fasting tube drawn this am  Fundal height: 26 Fetal heart rate: 145  Results for orders placed or performed in visit on 08/19/17 (from the past 24 hour(s))  POCT urinalysis dipstick   Collection Time: 08/19/17  9:54 AM  Result Value Ref Range   Color, UA     Clarity, UA     Glucose, UA neg    Bilirubin, UA     Ketones, UA neg    Spec Grav, UA  1.010 - 1.025   Blood, UA neg    pH, UA  5.0 - 8.0   Protein, UA neg    Urobilinogen, UA  0.2 or 1.0 E.U./dL   Nitrite, UA neg    Leukocytes, UA Moderate (2+) (A) Negative     Reviewed: ptl s/s, fkc. Recommended Tdap at HD/PCP per CDC guidelines.  Plan:  Come back this week for fasting labs (cmp & bile acids) d/t itching  Return for this week for fasting labs (no visit), then 4wks for LROB.  Orders Placed This Encounter  Procedures  . POCT urinalysis dipstick   PN2 and flu shot today  Tawnya Crook CNM, [facility] 08/19/2017 10:30 AM

## 2017-08-19 NOTE — Patient Instructions (Addendum)
Nothing to eat or drink after midnight prior to labs this week  Call the office [phone]) or go to [facility] if:  You begin to have strong, frequent contractions  Your water breaks.  Sometimes it is a big gush of fluid, sometimes it is just a trickle that keeps getting your panties wet or running down your legs  You have vaginal bleeding.  It is normal to have a small amount of spotting if your cervix was checked.   You don't feel your baby moving like normal.  If you don't, get you something to eat and drink and lay down and focus on feeling your baby move.  You should feel at least 10 movements in 2 hours.  If you don't, you should call the office or go to [facility].    Tdap Vaccine  It is recommended that you get the Tdap vaccine during the third trimester of EACH pregnancy to help protect your baby from getting pertussis (whooping cough)  27-36 weeks is the BEST time to do this so that you can pass the protection on to your baby. During pregnancy is better than after pregnancy, but if you are unable to get it during pregnancy it will be offered at the hospital.   You can get this vaccine at the health department or your family doctor  Everyone who will be around your baby should also be up-to-date on their vaccines. Adults (who are not pregnant) only need 1 dose of Tdap during adulthood.   Third Trimester of Pregnancy The third trimester is from week 29 through week 42, months 7 through 9. The third trimester is a time when the fetus is growing rapidly. At the end of the ninth month, the fetus is about 20 inches in length and weighs 6-10 pounds.  BODY CHANGES Your body goes through many changes during pregnancy. The changes vary from woman to woman.   Your weight will continue to increase. You can expect to gain 25-35 pounds (11-16 kg) by the end of the pregnancy.  You may begin to get stretch marks on your hips, abdomen, and breasts.  You may urinate more often  because the fetus is moving lower into your pelvis and pressing on your bladder.  You may develop or continue to have heartburn as a result of your pregnancy.  You may develop constipation because certain hormones are causing the muscles that push waste through your intestines to slow down.  You may develop hemorrhoids or swollen, bulging veins (varicose veins).  You may have pelvic pain because of the weight gain and pregnancy hormones relaxing your joints between the bones in your pelvis. Backaches may result from overexertion of the muscles supporting your posture.  You may have changes in your hair. These can include thickening of your hair, rapid growth, and changes in texture. Some women also have hair loss during or after pregnancy, or hair that feels dry or thin. Your hair will most likely return to normal after your baby is born.  Your breasts will continue to grow and be tender. A yellow discharge may leak from your breasts called colostrum.  Your belly button may stick out.  You may feel short of breath because of your expanding uterus.  You may notice the fetus "dropping," or moving lower in your abdomen.  You may have a bloody mucus discharge. This usually occurs a few days to a week before labor begins.  Your cervix becomes thin and soft (effaced) near your due  date. WHAT TO EXPECT AT [place] will have prenatal exams every 2 weeks until week 36. Then, you will have weekly prenatal exams. During a routine prenatal visit:  You will be weighed to make sure you and the fetus are growing normally.  Your blood pressure is taken.  Your abdomen will be measured to track your baby's growth.  The fetal heartbeat will be listened to.  Any test results from the previous visit will be discussed.  You may have a cervical check near your due date to see if you have effaced. At around 36 weeks, your caregiver will check your cervix. At the same time, your caregiver  will also perform a test on the secretions of the vaginal tissue. This test is to determine if a type of bacteria, Group B streptococcus, is present. Your caregiver will explain this further. Your caregiver may ask you:  What your birth plan is.  How you are feeling.  If you are feeling the baby move.  If you have had any abnormal symptoms, such as leaking fluid, bleeding, severe headaches, or abdominal cramping.  If you have any questions. Other tests or screenings that may be performed during your third trimester include:  Blood tests that check for low iron levels (anemia).  Fetal testing to check the health, activity level, and growth of the fetus. Testing is done if you have certain medical conditions or if there are problems during the pregnancy. FALSE LABOR You may feel small, irregular contractions that eventually go away. These are called Braxton Hicks contractions, or false labor. Contractions may last for hours, days, or even weeks before true labor sets in. If contractions come at regular intervals, intensify, or become painful, it is best to be seen by your caregiver.  SIGNS OF LABOR   Menstrual-like cramps.  Contractions that are 5 minutes apart or less.  Contractions that start on the top of the uterus and spread down to the lower abdomen and back.  A sense of increased pelvic pressure or back pain.  A watery or bloody mucus discharge that comes from the vagina. If you have any of these signs before the 37th week of pregnancy, call your caregiver right away. You need to go to the hospital to get checked immediately. HOME CARE INSTRUCTIONS   Avoid all smoking, herbs, alcohol, and unprescribed drugs. These chemicals affect the formation and growth of the baby.  Follow your caregiver's instructions regarding medicine use. There are medicines that are either safe or unsafe to take during pregnancy.  Exercise only as directed by your caregiver. Experiencing uterine  cramps is a good sign to stop exercising.  Continue to eat regular, healthy meals.  Wear a good support bra for breast tenderness.  Do not use hot tubs, steam rooms, or saunas.  Wear your seat belt at all times when driving.  Avoid raw meat, uncooked cheese, cat litter boxes, and soil used by cats. These carry germs that can cause birth defects in the baby.  Take your prenatal vitamins.  Try taking a stool softener (if your caregiver approves) if you develop constipation. Eat more high-fiber foods, such as fresh vegetables or fruit and whole grains. Drink plenty of fluids to keep your urine clear or pale yellow.  Take warm sitz baths to soothe any pain or discomfort caused by hemorrhoids. Use hemorrhoid cream if your caregiver approves.  If you develop varicose veins, wear support hose. Elevate your feet for 15 minutes, 3-4 times a  day. Limit salt in your diet.  Avoid heavy lifting, wear low heal shoes, and practice good posture.  Rest a lot with your legs elevated if you have leg cramps or low back pain.  Visit your dentist if you have not gone during your pregnancy. Use a soft toothbrush to brush your teeth and be gentle when you floss.  A sexual relationship may be continued unless your caregiver directs you otherwise.  Do not travel far distances unless it is absolutely necessary and only with the approval of your caregiver.  Take prenatal classes to understand, practice, and ask questions about the labor and delivery.  Make a trial run to the hospital.  Pack your hospital bag.  Prepare the baby's nursery.  Continue to go to all your prenatal visits as directed by your caregiver. SEEK MEDICAL CARE IF:  You are unsure if you are in labor or if your water has broken.  You have dizziness.  You have mild pelvic cramps, pelvic pressure, or nagging pain in your abdominal area.  You have persistent nausea, vomiting, or diarrhea.  You have a bad smelling vaginal  discharge.  You have pain with urination. SEEK IMMEDIATE MEDICAL CARE IF:   You have a fever.  You are leaking fluid from your vagina.  You have spotting or bleeding from your vagina.  You have severe abdominal cramping or pain.  You have rapid weight loss or gain.  You have shortness of breath with chest pain.  You notice sudden or extreme swelling of your face, hands, ankles, feet, or legs.  You have not felt your baby move in over an hour.  You have severe headaches that do not go away with medicine.  You have vision changes. Document Released: 11/12/2001 Document Revised: 11/23/2013 Document Reviewed: 01/19/2013 [facility] Patient Information 2015 [place], [place]. This information is not intended to replace advice given to you by your health care provider. Make sure you discuss any questions you have with your health care provider.

## 2017-08-20 LAB — CBC
Hematocrit: 37.7 % (ref 34.0–46.6)
Hemoglobin: 12.8 g/dL (ref 11.1–15.9)
MCH: 30.4 pg (ref 26.6–33.0)
MCHC: 34 g/dL (ref 31.5–35.7)
MCV: 90 fL (ref 79–97)
PLATELETS: 131 10*3/uL — AB (ref 150–379)
RBC: 4.21 x10E6/uL (ref 3.77–5.28)
RDW: 13.7 % (ref 12.3–15.4)
WBC: 11 10*3/uL — AB (ref 3.4–10.8)

## 2017-08-20 LAB — GLUCOSE TOLERANCE, 2 HOURS W/ 1HR
GLUCOSE, 2 HOUR: 113 mg/dL (ref 65–152)
GLUCOSE, FASTING: 81 mg/dL (ref 65–91)
Glucose, 1 hour: 91 mg/dL (ref 65–179)

## 2017-08-20 LAB — ANTIBODY SCREEN: Antibody Screen: NEGATIVE

## 2017-08-20 LAB — RPR: RPR Ser Ql: NONREACTIVE

## 2017-08-20 LAB — HIV ANTIBODY (ROUTINE TESTING W REFLEX): HIV Screen 4th Generation wRfx: NONREACTIVE

## 2017-08-22 ENCOUNTER — Other Ambulatory Visit: Payer: BLUE CROSS/BLUE SHIELD

## 2017-08-22 DIAGNOSIS — L299 Pruritus, unspecified: Secondary | ICD-10-CM

## 2017-08-24 LAB — COMPREHENSIVE METABOLIC PANEL
A/G RATIO: 1.3 (ref 1.2–2.2)
ALBUMIN: 3.6 g/dL (ref 3.5–5.5)
ALT: 12 IU/L (ref 0–32)
AST: 14 IU/L (ref 0–40)
Alkaline Phosphatase: 84 IU/L (ref 39–117)
BILIRUBIN TOTAL: 0.3 mg/dL (ref 0.0–1.2)
BUN / CREAT RATIO: 13 (ref 9–23)
BUN: 7 mg/dL (ref 6–20)
CHLORIDE: 106 mmol/L (ref 96–106)
CO2: 20 mmol/L (ref 20–29)
Calcium: 9 mg/dL (ref 8.7–10.2)
Creatinine, Ser: 0.54 mg/dL — ABNORMAL LOW (ref 0.57–1.00)
GFR calc non Af Amer: 124 mL/min/{1.73_m2} (ref >59)
GFR, EST AFRICAN AMERICAN: 143 mL/min/{1.73_m2} (ref >59)
GLOBULIN, TOTAL: 2.8 g/dL (ref 1.5–4.5)
Glucose: 85 mg/dL (ref 65–99)
POTASSIUM: 3.9 mmol/L (ref 3.5–5.2)
Sodium: 141 mmol/L (ref 134–144)
TOTAL PROTEIN: 6.4 g/dL (ref 6.0–8.5)

## 2017-08-24 LAB — BILE ACIDS, TOTAL: Bile Acids Total: 11.2 umol/L (ref 4.7–24.5)

## 2017-08-25 ENCOUNTER — Ambulatory Visit (HOSPITAL_COMMUNITY): Payer: Self-pay | Admitting: Psychiatry

## 2017-08-28 ENCOUNTER — Encounter: Payer: Self-pay | Admitting: Women's Health

## 2017-08-28 ENCOUNTER — Telehealth: Payer: Self-pay | Admitting: Women's Health

## 2017-08-28 DIAGNOSIS — O26613 Liver and biliary tract disorders in pregnancy, third trimester: Principal | ICD-10-CM

## 2017-08-28 DIAGNOSIS — O26619 Liver and biliary tract disorders in pregnancy, unspecified trimester: Secondary | ICD-10-CM

## 2017-08-28 DIAGNOSIS — K831 Obstruction of bile duct: Secondary | ICD-10-CM

## 2017-08-28 DIAGNOSIS — Z8719 Personal history of other diseases of the digestive system: Secondary | ICD-10-CM | POA: Insufficient documentation

## 2017-08-28 DIAGNOSIS — Z8759 Personal history of other complications of pregnancy, childbirth and the puerperium: Secondary | ICD-10-CM

## 2017-08-28 MED ORDER — URSODIOL 300 MG PO CAPS: 300.0000 mg | ORAL_CAPSULE | Freq: Three times a day (TID) | ORAL | 3 refills | Status: DC

## 2017-08-28 NOTE — Telephone Encounter (Signed)
LM, need to notify of bile acids/dx of ICP/plan of care.  Roma Schanz, CNM, WHNP-BC 08/28/2017 1:28 PM

## 2017-08-28 NOTE — Telephone Encounter (Signed)
Pt returned call, notified her of dx of ICP. States itching had improved slightly, now getting bad again. Offered ursodiol rx, she wants. Rx ursodiol 300mg  TID. Switched to front to schedule weekly bpp/HROB x 3wks. Will also get efw w/ next weeks u/s.  Roma Schanz, CNM, WHNP-BC 08/28/2017 2:00 PM

## 2017-09-01 ENCOUNTER — Telehealth: Payer: Self-pay | Admitting: Women's Health

## 2017-09-01 NOTE — Telephone Encounter (Signed)
Patient called stating that she would like a call back from [place], [place] states that she need to make a decision about something and she needed to tell kim before friday

## 2017-09-01 NOTE — Telephone Encounter (Signed)
LMOVM returning call 

## 2017-09-02 ENCOUNTER — Telehealth: Payer: Self-pay | Admitting: Women's Health

## 2017-09-02 ENCOUNTER — Ambulatory Visit (HOSPITAL_COMMUNITY): Payer: Self-pay | Admitting: Psychiatry

## 2017-09-02 ENCOUNTER — Telehealth: Payer: Self-pay | Admitting: *Deleted

## 2017-09-02 ENCOUNTER — Other Ambulatory Visit: Payer: Self-pay | Admitting: Obstetrics & Gynecology

## 2017-09-02 MED ORDER — URSODIOL 300 MG PO CAPS: ORAL_CAPSULE | ORAL | 3 refills | Status: DC

## 2017-09-02 NOTE — Telephone Encounter (Signed)
patient called stating that she is returning Tish's phone call. Please contact pt

## 2017-09-02 NOTE — Telephone Encounter (Signed)
Patient called stating her itching is worse and is now all over. She just started taking her Ursodiol on 9/27. Advised patient to continue take medication as prescribed and to try taking cool showers, use lotion with lanolin and to eat a low fat diet. Verbalized understanding. Patient has appointment on Friday.

## 2017-09-02 NOTE — Telephone Encounter (Signed)
Informed patient that Actigall was increased to 600mg  TID. Patient also states she has noticed a change in fetal movement today. Baby is moving but not as much. She has had a decrease in appetite, has tried to eat some crackers and drink Sprite but has not been able to lay down to focus on movement. Advised patient to try laying down if possible, eat something else and feel for movement. If no movement, to go to [employer]. Patient stated she was in [place] at [place] but would try.

## 2017-09-03 ENCOUNTER — Encounter (HOSPITAL_COMMUNITY): Payer: Self-pay | Admitting: Psychiatry

## 2017-09-03 ENCOUNTER — Ambulatory Visit (INDEPENDENT_AMBULATORY_CARE_PROVIDER_SITE_OTHER): Payer: BLUE CROSS/BLUE SHIELD | Admitting: Psychiatry

## 2017-09-03 DIAGNOSIS — F909 Attention-deficit hyperactivity disorder, unspecified type: Secondary | ICD-10-CM | POA: Diagnosis not present

## 2017-09-03 DIAGNOSIS — F329 Major depressive disorder, single episode, unspecified: Secondary | ICD-10-CM | POA: Diagnosis not present

## 2017-09-03 DIAGNOSIS — F32A Depression, unspecified: Secondary | ICD-10-CM
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
## 2017-09-04 ENCOUNTER — Telehealth: Payer: Self-pay | Admitting: *Deleted

## 2017-09-04 NOTE — Telephone Encounter (Signed)
Patient called with complaints of middle to upper abdominal sharp pain on left of belly button.  Only occurs intermittently. She states baby is not moving as much but she has been out and about. Advised patient to try drinking something sweet, eating something and lie down to focus on movement. If she does not feel 10 movements in 2 hours, she should go to [facility] for evaluation. Verbalized understanding.

## 2017-09-05 ENCOUNTER — Ambulatory Visit (INDEPENDENT_AMBULATORY_CARE_PROVIDER_SITE_OTHER): Payer: BLUE CROSS/BLUE SHIELD

## 2017-09-05 ENCOUNTER — Ambulatory Visit (INDEPENDENT_AMBULATORY_CARE_PROVIDER_SITE_OTHER): Payer: BLUE CROSS/BLUE SHIELD | Admitting: Women's Health

## 2017-09-05 ENCOUNTER — Encounter: Payer: Self-pay | Admitting: Women's Health

## 2017-09-05 VITALS — BP 120/80 | HR 74 | Wt 211.0 lb

## 2017-09-05 DIAGNOSIS — K831 Obstruction of bile duct: Secondary | ICD-10-CM

## 2017-09-05 DIAGNOSIS — Z3A30 30 weeks gestation of pregnancy: Secondary | ICD-10-CM | POA: Diagnosis not present

## 2017-09-05 DIAGNOSIS — Z1389 Encounter for screening for other disorder: Secondary | ICD-10-CM

## 2017-09-05 DIAGNOSIS — O26643 Intrahepatic cholestasis of pregnancy, third trimester: Secondary | ICD-10-CM

## 2017-09-05 DIAGNOSIS — O26613 Liver and biliary tract disorders in pregnancy, third trimester: Secondary | ICD-10-CM

## 2017-09-05 DIAGNOSIS — Z331 Pregnant state, incidental: Secondary | ICD-10-CM

## 2017-09-05 DIAGNOSIS — O26619 Liver and biliary tract disorders in pregnancy, unspecified trimester: Secondary | ICD-10-CM

## 2017-09-05 DIAGNOSIS — O0993 Supervision of high risk pregnancy, unspecified, third trimester: Secondary | ICD-10-CM

## 2017-09-05 LAB — POCT URINALYSIS DIPSTICK
Glucose, UA: NEGATIVE
KETONES UA: NEGATIVE
Leukocytes, UA: NEGATIVE
Nitrite, UA: NEGATIVE
Protein, UA: NEGATIVE
RBC UA: NEGATIVE

## 2017-09-05 NOTE — Progress Notes (Signed)
   [facility] High-Risk Pregnancy Visit  Patient name: Donna Kim MRN [id number]  Date of birth: Jun 03, 1981 CC & HPI:  Donna Kim is a 36 y.o. G14P2002 female at [redacted]w[redacted]d with an Estimated Date of Delivery: 11/13/17 being seen today for ongoing management of a high-risk pregnancy complicated by cholestasis of pregnancy. Currently taking ursodiol 300mg  TID, rx was increased to 600mg  TID the other day d/t complaint of increased itching, however she didn't receive call from pharmacy, so was unaware it had been called in.  Today she reports called yesterday for decreased fm, feeling normal today Review of Systems:   Reports good fetal movement. Denies regular contractions, leakage of fluid, vaginal bleeding, abnormal vaginal discharge w/ itching/odor/irritation, headaches, visual changes, shortness of breath, chest pain, abdominal pain, severe nausea/vomiting, or problems with urination or bowel movements unless otherwise stated above.  Pertinent History Reviewed:  Reviewed past medical,surgical, social and family history.  Reviewed problem list, medications and allergies. Objective Findings:   Vitals:   09/05/17 1110  BP: 120/80  Pulse: 74  Weight: 211 lb (95.7 kg)    Body mass index is 35.11 kg/m.  General appearance: Well appearing, and in no distress Mental status: Alert, oriented to person, place, and time Skin: Warm & dry Cardiovascular: Normal heart rate noted Respiratory: Normal respiratory effort, no distress Abdomen: Soft, gravid Fundal Height:  28 Fetal Heart rate:  144 u/s SVE: n/a Edema:  trace  Fetal Surveillance Testing today:  [place]  30+1 wks,transverse head right,fhr 144,BPP 8/8,CX 3.7 cm,normal ovaries bilat,post pl gr 3,afi 15.5 cm,efw 1743 g 67%  Results for orders placed or performed in visit on 09/05/17 (from the past 24 hour(s))  POCT urinalysis dipstick   Collection Time: 09/05/17 11:19 AM  Result Value Ref Range   Color, UA     Clarity, UA     Glucose, UA neg    Bilirubin, UA     Ketones, UA neg    Spec Grav, UA  1.010 - 1.025   Blood, UA neg    pH, UA  5.0 - 8.0   Protein, UA neg    Urobilinogen, UA  0.2 or 1.0 E.U./dL   Nitrite, UA neg    Leukocytes, UA Negative Negative    Assessment & Plan:  1) High-risk pregnancy G3P2002 at [redacted]w[redacted]d with an Estimated Date of Delivery: 11/13/17  2) Cholestasis of pregnancy, itching worsening> increase urosdiol to 600mg  TID as rx'd the other day. Not fasting today, gave requisitions for cmp/bile acids- will come one day next week to have drawn when fasting  Treatment Plan:  U/S @ dx then q 3wks    Weekly BPP @ 28wks then 2x/wk testing @ 32wks   Deliver PRN or 37wks Reviewed: today's normal u/s, ptl s/s, fkc All questions were answered  Return for as scheduled next Friday for HROB/BPP, add visit 10/23 for HROB/NST.   Orders Placed This Encounter  Procedures  . US FETAL BPP WO NON STRESS  . Comprehensive metabolic panel  . Bile acids, total  . POCT urinalysis dipstick   Tawnya Crook CNM, [facility] 09/05/2017 11:49 AM

## 2017-09-05 NOTE — Patient Instructions (Signed)
Call the office [phone]) or go to [facility] if:  You begin to have strong, frequent contractions  Your water breaks.  Sometimes it is a big gush of fluid, sometimes it is just a trickle that keeps getting your panties wet or running down your legs  You have vaginal bleeding.  It is normal to have a small amount of spotting if your cervix was checked.   You don't feel your baby moving like normal.  If you don't, get you something to eat and drink and lay down and focus on feeling your baby move.  You should feel at least 10 movements in 2 hours.  If you don't, you should call the office or go to [facility].     Cholestasis of Pregnancy Cholestasis refers to any condition that causes the flow of digestive fluid (bile) produced by the liver to slow down or stop. Cholestasis of pregnancy is most common toward the end of pregnancy (thirdtrimester), but it can occur any time during pregnancy. The condition often goes away soon after giving birth. Cholestasis may be uncomfortable, but it is usually harmless to you. However, it can be harmful to your baby. Cholestasis may increase the risk of:  Your baby being born too early (preterm delivery).  Your baby having a slow heart rate and lack of oxygen during delivery (fetal distress).  Losing your baby before delivery (stillbirth).  What are the causes? The exact cause of this condition is not known, but it may be related to:  Pregnancy hormones. The gallbladder normally holds bile until you need it to help digest fat in your diet. Pregnancy hormones may cause the flow of bile to slow down and back up into your liver. Bile may then get into your bloodstream and cause cholestasis symptoms.  Changes in your genes (genetic mutations). Specifically, genes that affect how the liver releases bile.  What increases the risk? You are more likely to develop this condition if:  You had cholestasis during a previous pregnancy.  You have a  family history of cholestasis.  You have liver problems.  You are having multiple babies, such as twins or triplets.  What are the signs or symptoms? The most common symptom of this condition is intense itching (pruritus), especially on the palms of your hands and soles of your feet. The itching can spread to the rest of your body and is often worse at night. You will not usually have a rash. Other symptoms may include:  Feeling tired.  Pain in your upper right abdomen.  Dark-colored urine.  Light-colored stools.  Poor appetite.  Yellowish discoloration of your skin and the whites of your eyes (jaundice).  How is this diagnosed? This condition is diagnosed based on:  Your medical history.  A physical exam.  Blood tests.  If you have an inherited risk for developing this condition, you may also have genetic testing. How is this treated? The goal of treatment is to make you comfortable and keep your baby safe. Your health care provider may:  Prescribe medicine to reduce bile acid in your bloodstream, relieve symptoms, and help keep your baby safe.  Give you vitamin K before delivery to prevent excessive bleeding.  Check your baby frequently (fetal monitoring).  Perform regular blood tests to check your bile levels and liver function until your baby is delivered.  Recommend starting (inducing) your labor and delivery by week 36 or 37 of pregnancy, or as soon as your baby's lungs have developed enough.  Follow these instructions at home:  Take over-the-counter and prescription medicines only as told by your health care provider.  Take cool baths to soothe itchy skin.  Wear comfortable, loose-fitting, cotton clothing to reduce itching.  Keep your fingernails short to prevent skin irritation from scratching.  Keep all follow-up visits and prenatal visits as told by your health care provider. This is important. Contact a health care provider if:  Your symptoms get  worse, even with treatment.  You develop pain in your right side.  You have unusual swelling in your abdomen, feet, ankles, or legs.  You have a fever.  You are more thirsty than usual. Get help right away if:  You go into early labor.  You have a headache that does not go away or causes changes in vision.  You have nausea or you vomit.  You have severe pain in your abdomen or shoulders.  You have shortness of breath. Summary  Cholestasis of pregnancy is most common toward the end of pregnancy (thirdtrimester), but it can occur any time during your pregnancy.  The condition often goes away soon after your baby is born.  The most common symptom of cholestasis of pregnancy is intense itching (pruritus), especially on the palms of your hands and soles of your feet.  This condition may be treated with medicine, frequent monitoring, or starting (inducing) labor and delivery by week 36 or 37 of pregnancy. This information is not intended to replace advice given to you by your health care provider. Make sure you discuss any questions you have with your health care provider. Document Released: 11/15/2000 Document Revised: 11/02/2016 Document Reviewed: 11/02/2016 Elsevier Interactive Patient Education  2017 [employer].   Fetal Movement Counts Patient Name: ________________________________________________ Patient Due Date: ____________________ What is a fetal movement count? A fetal movement count is the number of times that you feel your baby move during a certain amount of time. This may also be called a fetal kick count. A fetal movement count is recommended for every pregnant woman. You may be asked to start counting fetal movements as early as week 28 of your pregnancy. Pay attention to when your baby is most active. You may notice your baby's sleep and wake cycles. You may also notice things that make your baby move more. You should do a fetal movement count:  When your baby is  normally most active.  At the same time each day.  A good time to count movements is while you are resting, after having something to eat and drink. How do I count fetal movements? 1. Find a quiet, comfortable area. Sit, or lie down on your side. 2. Write down the date, the start time and stop time, and the number of movements that you felt between those two times. Take this information with you to your health care visits. 3. For 2 hours, count kicks, flutters, swishes, rolls, and jabs. You should feel at least 10 movements during 2 hours. 4. You may stop counting after you have felt 10 movements. 5. If you do not feel 10 movements in 2 hours, have something to eat and drink. Then, keep resting and counting for 1 hour. If you feel at least 4 movements during that hour, you may stop counting. Contact a health care provider if:  You feel fewer than 4 movements in 2 hours.  Your baby is not moving like he or she usually does. Date: ____________ Start time: ____________ Stop time: ____________ Movements: ____________ Date: ____________ Start  time: ____________ Stop time: ____________ Movements: ____________ Date: ____________ Start time: ____________ Stop time: ____________ Movements: ____________ Date: ____________ Start time: ____________ Stop time: ____________ Movements: ____________ Date: ____________ Start time: ____________ Stop time: ____________ Movements: ____________ Date: ____________ Start time: ____________ Stop time: ____________ Movements: ____________ Date: ____________ Start time: ____________ Stop time: ____________ Movements: ____________ Date: ____________ Start time: ____________ Stop time: ____________ Movements: ____________ Date: ____________ Start time: ____________ Stop time: ____________ Movements: ____________ This information is not intended to replace advice given to you by your health care provider. Make sure you discuss any questions you have with your health  care provider. Document Released: 12/18/2006 Document Revised: 07/17/2016 Document Reviewed: 12/28/2015 Elsevier Interactive Patient Education  [employer].

## 2017-09-05 NOTE — Progress Notes (Signed)
[place]  30+1 wks,transverse head right,fhr 144,BPP 8/8,CX 3.7 cm,normal ovaries bilat,post pl gr 3,afi 15.5 cm,efw 1743 g 67%

## 2017-09-11 ENCOUNTER — Other Ambulatory Visit: Payer: Self-pay | Admitting: Women's Health

## 2017-09-11 DIAGNOSIS — O26612 Liver and biliary tract disorders in pregnancy, second trimester: Principal | ICD-10-CM

## 2017-09-11 DIAGNOSIS — K831 Obstruction of bile duct: Secondary | ICD-10-CM

## 2017-09-12 ENCOUNTER — Encounter: Payer: Self-pay | Admitting: Obstetrics & Gynecology

## 2017-09-12 ENCOUNTER — Ambulatory Visit (INDEPENDENT_AMBULATORY_CARE_PROVIDER_SITE_OTHER): Payer: BLUE CROSS/BLUE SHIELD | Admitting: Obstetrics & Gynecology

## 2017-09-12 ENCOUNTER — Ambulatory Visit (INDEPENDENT_AMBULATORY_CARE_PROVIDER_SITE_OTHER): Payer: BLUE CROSS/BLUE SHIELD

## 2017-09-12 VITALS — BP 122/80 | HR 83 | Wt 212.0 lb

## 2017-09-12 DIAGNOSIS — O26613 Liver and biliary tract disorders in pregnancy, third trimester: Secondary | ICD-10-CM

## 2017-09-12 DIAGNOSIS — Z3A31 31 weeks gestation of pregnancy: Secondary | ICD-10-CM | POA: Diagnosis not present

## 2017-09-12 DIAGNOSIS — Z1389 Encounter for screening for other disorder: Secondary | ICD-10-CM

## 2017-09-12 DIAGNOSIS — Z331 Pregnant state, incidental: Secondary | ICD-10-CM

## 2017-09-12 DIAGNOSIS — O26619 Liver and biliary tract disorders in pregnancy, unspecified trimester: Secondary | ICD-10-CM

## 2017-09-12 DIAGNOSIS — K831 Obstruction of bile duct: Secondary | ICD-10-CM

## 2017-09-12 DIAGNOSIS — F418 Other specified anxiety disorders: Secondary | ICD-10-CM | POA: Diagnosis not present

## 2017-09-12 DIAGNOSIS — Z3402 Encounter for supervision of normal first pregnancy, second trimester: Secondary | ICD-10-CM

## 2017-09-12 DIAGNOSIS — O0993 Supervision of high risk pregnancy, unspecified, third trimester: Secondary | ICD-10-CM

## 2017-09-12 DIAGNOSIS — O99343 Other mental disorders complicating pregnancy, third trimester: Secondary | ICD-10-CM | POA: Diagnosis not present

## 2017-09-12 LAB — POCT URINALYSIS DIPSTICK
Blood, UA: NEGATIVE
Glucose, UA: NEGATIVE
Ketones, UA: NEGATIVE
LEUKOCYTES UA: NEGATIVE
NITRITE UA: NEGATIVE
PROTEIN UA: NEGATIVE

## 2017-09-12 NOTE — Progress Notes (Signed)
Fetal Surveillance Testing today:  BPP 8/8   High Risk Pregnancy Diagnosis(es):   ICP  G3P2002 [redacted]w[redacted]d Estimated Date of Delivery: 11/13/17  Blood pressure 122/80, pulse 83, weight 212 lb (96.2 kg), last menstrual period 02/06/2017.  Urinalysis: Negative   HPI: The patient is being seen today for ongoing management of ICP. Today she reports itching is improved   BP weight and urine results all reviewed and noted. Patient reports good fetal movement, denies any bleeding and no rupture of membranes symptoms or regular contractions.  Fundal Height:  34 cm Fetal Heart rate:  127 Edema:  1+  Patient is without complaints other than noted in her HPI. All questions were answered.  All lab and sonogram results have been reviewed. Comments:    Assessment:  1.  Pregnancy at [redacted]w[redacted]d,  Estimated Date of Delivery: 11/13/17 :                          2.  ICP: stable on ursodiol 600 TID                        3.    Medication(s) Plans:  continue ursodiol   Treatment Plan:  contineu weekly BPP til 32 weeks then twice weekly testing 32 weeks sonogram alternating with NST, plan delivery 39 weeks or as clinically indicated  Return in about 1 week (around 09/19/2017) for BPP/sono, HROB. for appointment for high risk OB care  No orders of the defined types were placed in this encounter.  Orders Placed This Encounter  Procedures  . POCT urinalysis dipstick

## 2017-09-12 NOTE — Progress Notes (Signed)
[place] 24+8 wks,cephalic,fhr 250 bpm,post pl gr 3,afi 13 cm,BPP 8/8

## 2017-09-14 LAB — COMPREHENSIVE METABOLIC PANEL
ALBUMIN: 3.6 g/dL (ref 3.5–5.5)
ALK PHOS: 101 IU/L (ref 39–117)
ALT: 10 IU/L (ref 0–32)
AST: 12 IU/L (ref 0–40)
Albumin/Globulin Ratio: 1.2 (ref 1.2–2.2)
BUN / CREAT RATIO: 16 (ref 9–23)
BUN: 9 mg/dL (ref 6–20)
Bilirubin Total: 0.3 mg/dL (ref 0.0–1.2)
CALCIUM: 9.1 mg/dL (ref 8.7–10.2)
CO2: 17 mmol/L — AB (ref 20–29)
CREATININE: 0.55 mg/dL — AB (ref 0.57–1.00)
Chloride: 106 mmol/L (ref 96–106)
GFR calc Af Amer: 143 mL/min/{1.73_m2} (ref >59)
GFR calc non Af Amer: 124 mL/min/{1.73_m2} (ref >59)
Globulin, Total: 2.9 g/dL (ref 1.5–4.5)
Glucose: 82 mg/dL (ref 65–99)
Potassium: 4.2 mmol/L (ref 3.5–5.2)
Sodium: 140 mmol/L (ref 134–144)
Total Protein: 6.5 g/dL (ref 6.0–8.5)

## 2017-09-14 LAB — BILE ACIDS, TOTAL: BILE ACIDS TOTAL: 9.3 umol/L (ref 4.7–24.5)

## 2017-09-16 ENCOUNTER — Encounter: Payer: BLUE CROSS/BLUE SHIELD | Admitting: Obstetrics & Gynecology

## 2017-09-17 ENCOUNTER — Other Ambulatory Visit: Payer: Self-pay | Admitting: Obstetrics & Gynecology

## 2017-09-17 ENCOUNTER — Encounter (HOSPITAL_COMMUNITY): Payer: Self-pay | Admitting: Psychiatry

## 2017-09-17 ENCOUNTER — Ambulatory Visit (INDEPENDENT_AMBULATORY_CARE_PROVIDER_SITE_OTHER): Payer: BLUE CROSS/BLUE SHIELD | Admitting: Psychiatry

## 2017-09-17 DIAGNOSIS — F329 Major depressive disorder, single episode, unspecified: Secondary | ICD-10-CM | POA: Diagnosis not present

## 2017-09-17 DIAGNOSIS — F909 Attention-deficit hyperactivity disorder, unspecified type: Secondary | ICD-10-CM

## 2017-09-17 DIAGNOSIS — F32A Depression, unspecified: Secondary | ICD-10-CM

## 2017-09-17 DIAGNOSIS — K831 Obstruction of bile duct: Secondary | ICD-10-CM
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
## 2017-09-19 ENCOUNTER — Encounter: Payer: Self-pay | Admitting: Women's Health

## 2017-09-19 ENCOUNTER — Ambulatory Visit (INDEPENDENT_AMBULATORY_CARE_PROVIDER_SITE_OTHER): Payer: BLUE CROSS/BLUE SHIELD

## 2017-09-19 ENCOUNTER — Ambulatory Visit (INDEPENDENT_AMBULATORY_CARE_PROVIDER_SITE_OTHER): Payer: BLUE CROSS/BLUE SHIELD | Admitting: Women's Health

## 2017-09-19 VITALS — BP 126/78 | HR 93 | Wt 215.0 lb

## 2017-09-19 DIAGNOSIS — O26613 Liver and biliary tract disorders in pregnancy, third trimester: Secondary | ICD-10-CM | POA: Diagnosis not present

## 2017-09-19 DIAGNOSIS — Z3A32 32 weeks gestation of pregnancy: Secondary | ICD-10-CM

## 2017-09-19 DIAGNOSIS — O0993 Supervision of high risk pregnancy, unspecified, third trimester: Secondary | ICD-10-CM

## 2017-09-19 DIAGNOSIS — K831 Obstruction of bile duct: Secondary | ICD-10-CM

## 2017-09-19 DIAGNOSIS — O26619 Liver and biliary tract disorders in pregnancy, unspecified trimester: Principal | ICD-10-CM

## 2017-09-19 DIAGNOSIS — Z3403 Encounter for supervision of normal first pregnancy, third trimester: Secondary | ICD-10-CM

## 2017-09-19 DIAGNOSIS — Z1389 Encounter for screening for other disorder: Secondary | ICD-10-CM

## 2017-09-19 DIAGNOSIS — Z331 Pregnant state, incidental: Secondary | ICD-10-CM

## 2017-09-19 LAB — POCT URINALYSIS DIPSTICK
Blood, UA: NEGATIVE
Glucose, UA: NEGATIVE
KETONES UA: NEGATIVE
Nitrite, UA: NEGATIVE
PROTEIN UA: NEGATIVE

## 2017-09-19 NOTE — Progress Notes (Signed)
   HIGH-RISK PREGNANCY VISIT Patient name: Donna Kim MRN [id number]  Date of birth: 1981/04/11 Chief Complaint:   Routine Prenatal Visit (u/s today)  History of Present Illness:   Donna Kim is a 36 y.o. G85P2002 female at [redacted]w[redacted]d with an Estimated Date of Delivery: 11/13/17 being seen today for ongoing management of a high-risk pregnancy complicated by cholestasis of pregnancy, currently taking actigall 600mg  TID.  Today she reports itching improved w/ meds, if she misses dose itching definitely worsens. Some constipation and problems sleeping. Bile acids down to 9.3 from 11.2, normal LFTs on 10/12.  Contractions: Not present. Vag. Bleeding: None.  Movement: Present. denies leaking of fluid.  Review of Systems:   Pertinent items are noted in HPI Denies abnormal vaginal discharge w/ itching/odor/irritation, headaches, visual changes, shortness of breath, chest pain, abdominal pain, severe nausea/vomiting, or problems with urination or bowel movements unless otherwise stated above. Pertinent History Reviewed:  Reviewed past medical,surgical, social, obstetrical and family history.  Reviewed problem list, medications and allergies. Physical Assessment:   Vitals:   09/19/17 1115  BP: 126/78  Pulse: 93  Weight: 215 lb (97.5 kg)  Body mass index is 35.78 kg/m.           Physical Examination:   General appearance: alert, well appearing, and in no distress  Mental status: alert, oriented to person, place, and time  Skin: warm & dry   Extremities: Edema: None    Cardiovascular: normal heart rate noted   Respiratory: normal respiratory effort, no distress  Abdomen: gravid, soft, non-tender  Pelvic: Cervical exam deferred         Fetal Status: Fetal Heart Rate (bpm): 152 u/s Fundal Height: 32 cm Movement: Present   Fetal Surveillance Testing today:  [place] 06+3 wks,cephalic,post pl gr 3,normal ovaries bilat,BPP 8/8,AFI 13.8 cm,fhr 152 bpm  Results for orders placed or performed in visit  on 09/19/17 (from the past 24 hour(s))  POCT urinalysis dipstick   Collection Time: 09/19/17 11:18 AM  Result Value Ref Range   Color, UA     Clarity, UA     Glucose, UA neg    Bilirubin, UA     Ketones, UA neg    Spec Grav, UA  1.010 - 1.025   Blood, UA neg    pH, UA  5.0 - 8.0   Protein, UA neg    Urobilinogen, UA  0.2 or 1.0 E.U./dL   Nitrite, UA neg    Leukocytes, UA Moderate (2+) (A) Negative    Assessment & Plan:  1) High-risk pregnancy G3P2002 at [redacted]w[redacted]d with an Estimated Date of Delivery: 11/13/17   2) ICP, stable, continue actigall 600mg  TID  Labs/procedures today: bpp u/s  Treatment Plan:  U/S @ dx then q 3wks     Begin 2x/wk testing    Deliver PRN or 37wks  Reviewed: Preterm labor symptoms and general obstetric precautions including but not limited to vaginal bleeding, contractions, leaking of fluid and fetal movement were reviewed in detail with the patient.  All questions were answered.  Follow-up: Return for As scheduled Tues for NST/HROB.  Orders Placed This Encounter  Procedures  . POCT urinalysis dipstick   Tawnya Crook CNM, [facility] 09/19/2017 11:32 AM

## 2017-09-19 NOTE — Progress Notes (Signed)
[place] 38+1 wks,cephalic,post pl gr 3,normal ovaries bilat,BPP 8/8,AFI 13.8 cm,fhr 152 bpm

## 2017-09-19 NOTE — Patient Instructions (Addendum)
Call the office [phone]) or go to [facility] if:  You begin to have strong, frequent contractions  Your water breaks.  Sometimes it is a big gush of fluid, sometimes it is just a trickle that keeps getting your panties wet or running down your legs  You have vaginal bleeding.  It is normal to have a small amount of spotting if your cervix was checked.   You don't feel your baby moving like normal.  If you don't, get you something to eat and drink and lay down and focus on feeling your baby move.  You should feel at least 10 movements in 2 hours.  If you don't, you should call the office or go to [facility].    Constipation  Drink plenty of fluid, preferably water, throughout the day  Eat foods high in fiber such as fruits, vegetables, and grains  Exercise, such as walking, is a good way to keep your bowels regular  Drink warm fluids, especially warm prune juice, or decaf coffee  Eat a 1/2 cup of real oatmeal (not instant), 1/2 cup applesauce, and 1/2-1 cup warm prune juice every day  If needed, you may take Colace (docusate sodium) stool softener once or twice a day to help keep the stool soft. If you are pregnant, wait until you are out of your first trimester (12-14 weeks of pregnancy)  If you still are having problems with constipation, you may take Miralax once daily as needed to help keep your bowels regular.  If you are pregnant, wait until you are out of your first trimester (12-14 weeks of pregnancy)   Tips to Help You Sleep Better:   Get into a bedtime routine, try to do the same thing every night before going to bed to try to help your body wind down  Warm baths  Avoid caffeine for at least 3 hours before going to sleep   Keep your room at a slightly cooler temperature, can try running a fan  Turn off TV, lights, phone, electronics  Lots of pillows if needed to help you get comfortable  Lavender scented items can help you sleep. You can place lavender  essential oil on a cotton ball and place under your pillowcase, or place in a diffuser. [employer] has a lavender scented sleep line (plug-ins, sprays, etc). Look in the pillow aisle for lavender scented pillows.   If none of the above things help, you can try 1/2 to 1 tablet of benadryl, unisom, or tylenol pm. Do not take this every night, only when you really need it.     Preterm Labor and Birth Information The normal length of a pregnancy is 39-41 weeks. Preterm labor is when labor starts before 37 completed weeks of pregnancy. What are the risk factors for preterm labor? Preterm labor is more likely to occur in women who:  Have certain infections during pregnancy such as a bladder infection, sexually transmitted infection, or infection inside the uterus (chorioamnionitis).  Have a shorter-than-normal cervix.  Have gone into preterm labor before.  Have had surgery on their cervix.  Are younger than age 41 or older than age 54.  Are African American.  Are pregnant with twins or multiple babies (multiple gestation).  Take street drugs or smoke while pregnant.  Do not gain enough weight while pregnant.  Became pregnant shortly after having been pregnant.  What are the symptoms of preterm labor? Symptoms of preterm labor include:  Cramps similar to those that can happen during  a menstrual period. The cramps may happen with diarrhea.  Pain in the abdomen or lower back.  Regular uterine contractions that may feel like tightening of the abdomen.  A feeling of increased pressure in the pelvis.  Increased watery or bloody mucus discharge from the vagina.  Water breaking (ruptured amniotic sac).  Why is it important to recognize signs of preterm labor? It is important to recognize signs of preterm labor because babies who are born prematurely may not be fully developed. This can put them at an increased risk for:  Long-term (chronic) heart and lung problems.  Difficulty  immediately after birth with regulating body systems, including blood sugar, body temperature, heart rate, and breathing rate.  Bleeding in the brain.  Cerebral palsy.  Learning difficulties.  Death.  These risks are highest for babies who are born before 46 weeks of pregnancy. How is preterm labor treated? Treatment depends on the length of your pregnancy, your condition, and the health of your baby. It may involve:  Having a stitch (suture) placed in your cervix to prevent your cervix from opening too early (cerclage).  Taking or being given medicines, such as: ? Hormone medicines. These may be given early in pregnancy to help support the pregnancy. ? Medicine to stop contractions. ? Medicines to help mature the baby's lungs. These may be prescribed if the risk of delivery is high. ? Medicines to prevent your baby from developing cerebral palsy.  If the labor happens before 34 weeks of pregnancy, you may need to stay in the hospital. What should I do if I think I am in preterm labor? If you think that you are going into preterm labor, call your health care provider right away. How can I prevent preterm labor in future pregnancies? To increase your chance of having a full-term pregnancy:  Do not use any tobacco products, such as cigarettes, chewing tobacco, and e-cigarettes. If you need help quitting, ask your health care provider.  Do not use street drugs or medicines that have not been prescribed to you during your pregnancy.  Talk with your health care provider before taking any herbal supplements, even if you have been taking them regularly.  Make sure you gain a healthy amount of weight during your pregnancy.  Watch for infection. If you think that you might have an infection, get it checked right away.  Make sure to tell your health care provider if you have gone into preterm labor before.  This information is not intended to replace advice given to you by your health  care provider. Make sure you discuss any questions you have with your health care provider. Document Released: 02/08/2004 Document Revised: 04/30/2016 Document Reviewed: 04/10/2016 Elsevier Interactive Patient Education  2018 [employer].

## 2017-09-23 ENCOUNTER — Ambulatory Visit (INDEPENDENT_AMBULATORY_CARE_PROVIDER_SITE_OTHER): Payer: BLUE CROSS/BLUE SHIELD | Admitting: Obstetrics & Gynecology

## 2017-09-23 VITALS — BP 124/68 | HR 89 | Wt 215.0 lb

## 2017-09-23 DIAGNOSIS — K831 Obstruction of bile duct: Secondary | ICD-10-CM

## 2017-09-23 DIAGNOSIS — O0993 Supervision of high risk pregnancy, unspecified, third trimester: Secondary | ICD-10-CM

## 2017-09-23 DIAGNOSIS — O26613 Liver and biliary tract disorders in pregnancy, third trimester: Secondary | ICD-10-CM

## 2017-09-23 DIAGNOSIS — Z1389 Encounter for screening for other disorder: Secondary | ICD-10-CM

## 2017-09-23 DIAGNOSIS — Z331 Pregnant state, incidental: Secondary | ICD-10-CM

## 2017-09-23 DIAGNOSIS — Z3A32 32 weeks gestation of pregnancy: Secondary | ICD-10-CM

## 2017-09-23 LAB — POCT URINALYSIS DIPSTICK
Glucose, UA: NEGATIVE
Ketones, UA: NEGATIVE
Leukocytes, UA: NEGATIVE
Nitrite, UA: NEGATIVE
Protein, UA: NEGATIVE
RBC UA: NEGATIVE

## 2017-09-23 NOTE — Progress Notes (Signed)
Fetal Surveillance Testing today:  Reactive NST   High Risk Pregnancy Diagnosis(es):   ICP  G3P2002 [redacted]w[redacted]d Estimated Date of Delivery: 11/13/17  Blood pressure 124/68, pulse 89, weight 215 lb (97.5 kg), last menstrual period 02/06/2017.  Urinalysis: Negative   HPI: The patient is being seen today for ongoing management of ICP. Today she reports itching is better   BP weight and urine results all reviewed and noted. Patient reports good fetal movement, denies any bleeding and no rupture of membranes symptoms or regular contractions.  Fundal Height:  36 Fetal Heart rate:  135 Edema:  none  Patient is without complaints other than noted in her HPI. All questions were answered.  All lab and sonogram results have been reviewed. Comments:    Assessment:  1.  Pregnancy at [redacted]w[redacted]d,  Estimated Date of Delivery: 11/13/17 :                          2.  ICP with stable bile acids                        3.    Medication(s) Plans:  Actigall 600 TID  Treatment Plan:  Twice weekly testing sonogram alternating with NST, planned delivery at 37 weeks  Return in about 3 days (around 09/26/2017) for BPP/sono, HROB. for appointment for high risk OB care  No orders of the defined types were placed in this encounter.  Orders Placed This Encounter  Procedures  . POCT Urinalysis Dipstick

## 2017-09-25 ENCOUNTER — Other Ambulatory Visit: Payer: Self-pay | Admitting: Obstetrics & Gynecology

## 2017-09-25 DIAGNOSIS — O26613 Liver and biliary tract disorders in pregnancy, third trimester: Principal | ICD-10-CM

## 2017-09-25 DIAGNOSIS — K831 Obstruction of bile duct: Secondary | ICD-10-CM

## 2017-09-26 ENCOUNTER — Encounter: Payer: Self-pay | Admitting: Women's Health

## 2017-09-26 ENCOUNTER — Ambulatory Visit (INDEPENDENT_AMBULATORY_CARE_PROVIDER_SITE_OTHER): Payer: BLUE CROSS/BLUE SHIELD

## 2017-09-26 ENCOUNTER — Ambulatory Visit (INDEPENDENT_AMBULATORY_CARE_PROVIDER_SITE_OTHER): Payer: BLUE CROSS/BLUE SHIELD | Admitting: Women's Health

## 2017-09-26 VITALS — BP 120/76 | HR 86 | Wt 216.0 lb

## 2017-09-26 DIAGNOSIS — Z1389 Encounter for screening for other disorder: Secondary | ICD-10-CM

## 2017-09-26 DIAGNOSIS — K831 Obstruction of bile duct: Secondary | ICD-10-CM

## 2017-09-26 DIAGNOSIS — Z3A33 33 weeks gestation of pregnancy: Secondary | ICD-10-CM

## 2017-09-26 DIAGNOSIS — O26613 Liver and biliary tract disorders in pregnancy, third trimester: Secondary | ICD-10-CM | POA: Diagnosis not present

## 2017-09-26 DIAGNOSIS — O0993 Supervision of high risk pregnancy, unspecified, third trimester: Secondary | ICD-10-CM

## 2017-09-26 DIAGNOSIS — O26619 Liver and biliary tract disorders in pregnancy, unspecified trimester: Secondary | ICD-10-CM

## 2017-09-26 DIAGNOSIS — Z3403 Encounter for supervision of normal first pregnancy, third trimester: Secondary | ICD-10-CM

## 2017-09-26 DIAGNOSIS — Z331 Pregnant state, incidental: Secondary | ICD-10-CM

## 2017-09-26 LAB — POCT URINALYSIS DIPSTICK
Glucose, UA: NEGATIVE
Ketones, UA: NEGATIVE
LEUKOCYTES UA: NEGATIVE
NITRITE UA: NEGATIVE
PROTEIN UA: NEGATIVE
RBC UA: NEGATIVE

## 2017-09-26 NOTE — Progress Notes (Signed)
[place] 81+4 wks,cephalic,BPP 4/8,JEHUDJ ov's bilat,post pl gr 3,fhr 161 bpm,fhr 9.8 cm,EFW 2262 g 55%

## 2017-09-26 NOTE — Patient Instructions (Signed)
Donna Kim, I greatly value your feedback.  If you receive a survey following your visit with [place] today, we appreciate you taking the time to fill it out.  Thanks, [name], CNM, WHNP-BC   Call the office [phone]) or go to [facility] if:  You begin to have strong, frequent contractions  Your water breaks.  Sometimes it is a big gush of fluid, sometimes it is just a trickle that keeps getting your panties wet or running down your legs  You have vaginal bleeding.  It is normal to have a small amount of spotting if your cervix was checked.   You don't feel your baby moving like normal.  If you don't, get you something to eat and drink and lay down and focus on feeling your baby move.  You should feel at least 10 movements in 2 hours.  If you don't, you should call the office or go to [place] and Birth Information The normal length of a pregnancy is 39-41 weeks. Preterm labor is when labor starts before 37 completed weeks of pregnancy. What are the risk factors for preterm labor? Preterm labor is more likely to occur in women who:  Have certain infections during pregnancy such as a bladder infection, sexually transmitted infection, or infection inside the uterus (chorioamnionitis).  Have a shorter-than-normal cervix.  Have gone into preterm labor before.  Have had surgery on their cervix.  Are younger than age 29 or older than age 55.  Are African American.  Are pregnant with twins or multiple babies (multiple gestation).  Take street drugs or smoke while pregnant.  Do not gain enough weight while pregnant.  Became pregnant shortly after having been pregnant.  What are the symptoms of preterm labor? Symptoms of preterm labor include:  Cramps similar to those that can happen during a menstrual period. The cramps may happen with diarrhea.  Pain in the abdomen or lower back.  Regular uterine contractions that may feel like tightening of  the abdomen.  A feeling of increased pressure in the pelvis.  Increased watery or bloody mucus discharge from the vagina.  Water breaking (ruptured amniotic sac).  Why is it important to recognize signs of preterm labor? It is important to recognize signs of preterm labor because babies who are born prematurely may not be fully developed. This can put them at an increased risk for:  Long-term (chronic) heart and lung problems.  Difficulty immediately after birth with regulating body systems, including blood sugar, body temperature, heart rate, and breathing rate.  Bleeding in the brain.  Cerebral palsy.  Learning difficulties.  Death.  These risks are highest for babies who are born before 15 weeks of pregnancy. How is preterm labor treated? Treatment depends on the length of your pregnancy, your condition, and the health of your baby. It may involve:  Having a stitch (suture) placed in your cervix to prevent your cervix from opening too early (cerclage).  Taking or being given medicines, such as: ? Hormone medicines. These may be given early in pregnancy to help support the pregnancy. ? Medicine to stop contractions. ? Medicines to help mature the baby's lungs. These may be prescribed if the risk of delivery is high. ? Medicines to prevent your baby from developing cerebral palsy.  If the labor happens before 34 weeks of pregnancy, you may need to stay in the hospital. What should I do if I think I am in preterm labor? If you  think that you are going into preterm labor, call your health care provider right away. How can I prevent preterm labor in future pregnancies? To increase your chance of having a full-term pregnancy:  Do not use any tobacco products, such as cigarettes, chewing tobacco, and e-cigarettes. If you need help quitting, ask your health care provider.  Do not use street drugs or medicines that have not been prescribed to you during your pregnancy.  Talk  with your health care provider before taking any herbal supplements, even if you have been taking them regularly.  Make sure you gain a healthy amount of weight during your pregnancy.  Watch for infection. If you think that you might have an infection, get it checked right away.  Make sure to tell your health care provider if you have gone into preterm labor before.  This information is not intended to replace advice given to you by your health care provider. Make sure you discuss any questions you have with your health care provider. Document Released: 02/08/2004 Document Revised: 04/30/2016 Document Reviewed: 04/10/2016 Elsevier Interactive Patient Education  2018 [employer].

## 2017-09-26 NOTE — Progress Notes (Signed)
   HIGH-RISK PREGNANCY VISIT Patient name: Donna Kim MRN [id number]  Date of birth: 03-14-1981 Chief Complaint:   High Risk Gestation (u/s today)  History of Present Illness:   Donna Kim is a 36 y.o. G15P2002 female at [redacted]w[redacted]d with an Estimated Date of Delivery: 11/13/17 being seen today for ongoing management of a high-risk pregnancy complicated by cholestasis of pregnancy, currently taking actigall 600mg  TID.  Today she reports no complaints. Contractions: Not present. Vag. Bleeding: None.  Movement: Present. denies leaking of fluid.  Review of Systems:   Pertinent items are noted in HPI Denies abnormal vaginal discharge w/ itching/odor/irritation, headaches, visual changes, shortness of breath, chest pain, abdominal pain, severe nausea/vomiting, or problems with urination or bowel movements unless otherwise stated above. Pertinent History Reviewed:  Reviewed past medical,surgical, social, obstetrical and family history.  Reviewed problem list, medications and allergies. Physical Assessment:   Vitals:   09/26/17 1115  BP: 120/76  Pulse: 86  Weight: 216 lb (98 kg)  Body mass index is 35.94 kg/m.           Physical Examination:   General appearance: alert, well appearing, and in no distress  Mental status: alert, oriented to person, place, and time  Skin: warm & dry   Extremities: Edema: None    Cardiovascular: normal heart rate noted  Respiratory: normal respiratory effort, no distress  Abdomen: gravid, soft, non-tender  Pelvic: Cervical exam deferred         Fetal Status: Fetal Heart Rate (bpm): 161 u/s   Movement: Present   Fetal Surveillance Testing today:  [place] 18+2 wks,cephalic,BPP 9/9,BZJIRC ov's bilat,post pl gr 3,fhr 161 bpm, AFI 9.8 cm,EFW 2262 g 55%   Results for orders placed or performed in visit on 09/26/17 (from the past 24 hour(s))  POCT urinalysis dipstick   Collection Time: 09/26/17 11:16 AM  Result Value Ref Range   Color, UA     Clarity, UA     Glucose, UA neg    Bilirubin, UA     Ketones, UA neg    Spec Grav, UA  1.010 - 1.025   Blood, UA neg    pH, UA  5.0 - 8.0   Protein, UA neg    Urobilinogen, UA  0.2 or 1.0 E.U./dL   Nitrite, UA neg    Leukocytes, UA Negative Negative    Assessment & Plan:  1) High-risk pregnancy G3P2002 at [redacted]w[redacted]d with an Estimated Date of Delivery: 11/13/17   2) Cholestasis of pregnancy, stable, continue actigall 600mg  TID  Labs/procedures today: u/s  Treatment Plan: growth u/s q 3wks     2x/wk testing nst alt w/ sono   Deliver 37wks or prn  Reviewed: Preterm labor symptoms and general obstetric precautions including but not limited to vaginal bleeding, contractions, leaking of fluid and fetal movement were reviewed in detail with the patient.  All questions were answered.  Follow-up: Return for As scheduled Tues for HROB/NST.  Orders Placed This Encounter  Procedures  . POCT urinalysis dipstick   Tawnya Crook CNM, [facility] 09/26/2017 11:38 AM

## 2017-09-30 ENCOUNTER — Encounter: Payer: Self-pay | Admitting: Women's Health

## 2017-09-30 ENCOUNTER — Ambulatory Visit (INDEPENDENT_AMBULATORY_CARE_PROVIDER_SITE_OTHER): Payer: BLUE CROSS/BLUE SHIELD | Admitting: Women's Health

## 2017-09-30 VITALS — BP 122/70 | HR 88 | Wt 216.0 lb

## 2017-09-30 DIAGNOSIS — O26613 Liver and biliary tract disorders in pregnancy, third trimester: Secondary | ICD-10-CM

## 2017-09-30 DIAGNOSIS — O0993 Supervision of high risk pregnancy, unspecified, third trimester: Secondary | ICD-10-CM

## 2017-09-30 DIAGNOSIS — Z3A33 33 weeks gestation of pregnancy: Secondary | ICD-10-CM | POA: Diagnosis not present

## 2017-09-30 DIAGNOSIS — Z1389 Encounter for screening for other disorder: Secondary | ICD-10-CM

## 2017-09-30 DIAGNOSIS — K831 Obstruction of bile duct: Secondary | ICD-10-CM | POA: Diagnosis not present

## 2017-09-30 DIAGNOSIS — O368130 Decreased fetal movements, third trimester, not applicable or unspecified: Secondary | ICD-10-CM

## 2017-09-30 DIAGNOSIS — Z3403 Encounter for supervision of normal first pregnancy, third trimester: Secondary | ICD-10-CM

## 2017-09-30 DIAGNOSIS — Z331 Pregnant state, incidental: Secondary | ICD-10-CM

## 2017-09-30 LAB — POCT URINALYSIS DIPSTICK
Glucose, UA: NEGATIVE
KETONES UA: NEGATIVE
Leukocytes, UA: NEGATIVE
Nitrite, UA: NEGATIVE
PROTEIN UA: NEGATIVE
RBC UA: NEGATIVE

## 2017-09-30 NOTE — Patient Instructions (Addendum)
Donna Kim, I greatly value your feedback.  If you receive a survey following your visit with [place] today, we appreciate you taking the time to fill it out.  Thanks, [name], CNM, WHNP-BC   Call the office [phone]) or go to [facility] if:  You begin to have strong, frequent contractions  Your water breaks.  Sometimes it is a big gush of fluid, sometimes it is just a trickle that keeps getting your panties wet or running down your legs  You have vaginal bleeding.  It is normal to have a small amount of spotting if your cervix was checked.   You don't feel your baby moving like normal.  If you don't, get you something to eat and drink and lay down and focus on feeling your baby move.  You should feel at least 10 movements in 2 hours.  If you don't, you should call the office or go to [place]:   This is usually related to either your blood sugar or your blood pressure dropping  Make sure you are staying well hydrated and drinking enough water so that your urine is clear  Eat small frequent meals and snacks containing protein (meat, eggs, nuts, cheese) so that your blood sugar doesn't drop  If you do get dizzy, sit/lay down and get you something to drink and a snack containing protein- you will usually start feeling better in 10-20 minutes      Preterm Labor and Birth Information The normal length of a pregnancy is 39-41 weeks. Preterm labor is when labor starts before 37 completed weeks of pregnancy. What are the risk factors for preterm labor? Preterm labor is more likely to occur in women who:  Have certain infections during pregnancy such as a bladder infection, sexually transmitted infection, or infection inside the uterus (chorioamnionitis).  Have a shorter-than-normal cervix.  Have gone into preterm labor before.  Have had surgery on their cervix.  Are younger than age 54 or older than age 62.  Are African American.  Are pregnant  with twins or multiple babies (multiple gestation).  Take street drugs or smoke while pregnant.  Do not gain enough weight while pregnant.  Became pregnant shortly after having been pregnant.  What are the symptoms of preterm labor? Symptoms of preterm labor include:  Cramps similar to those that can happen during a menstrual period. The cramps may happen with diarrhea.  Pain in the abdomen or lower back.  Regular uterine contractions that may feel like tightening of the abdomen.  A feeling of increased pressure in the pelvis.  Increased watery or bloody mucus discharge from the vagina.  Water breaking (ruptured amniotic sac).  Why is it important to recognize signs of preterm labor? It is important to recognize signs of preterm labor because babies who are born prematurely may not be fully developed. This can put them at an increased risk for:  Long-term (chronic) heart and lung problems.  Difficulty immediately after birth with regulating body systems, including blood sugar, body temperature, heart rate, and breathing rate.  Bleeding in the brain.  Cerebral palsy.  Learning difficulties.  Death.  These risks are highest for babies who are born before 63 weeks of pregnancy. How is preterm labor treated? Treatment depends on the length of your pregnancy, your condition, and the health of your baby. It may involve:  Having a stitch (suture) placed in your cervix to prevent your cervix from opening too early (cerclage).  Taking or being given medicines, such as: ? Hormone medicines. These may be given early in pregnancy to help support the pregnancy. ? Medicine to stop contractions. ? Medicines to help mature the baby's lungs. These may be prescribed if the risk of delivery is high. ? Medicines to prevent your baby from developing cerebral palsy.  If the labor happens before 34 weeks of pregnancy, you may need to stay in the hospital. What should I do if I think I am  in preterm labor? If you think that you are going into preterm labor, call your health care provider right away. How can I prevent preterm labor in future pregnancies? To increase your chance of having a full-term pregnancy:  Do not use any tobacco products, such as cigarettes, chewing tobacco, and e-cigarettes. If you need help quitting, ask your health care provider.  Do not use street drugs or medicines that have not been prescribed to you during your pregnancy.  Talk with your health care provider before taking any herbal supplements, even if you have been taking them regularly.  Make sure you gain a healthy amount of weight during your pregnancy.  Watch for infection. If you think that you might have an infection, get it checked right away.  Make sure to tell your health care provider if you have gone into preterm labor before.  This information is not intended to replace advice given to you by your health care provider. Make sure you discuss any questions you have with your health care provider. Document Released: 02/08/2004 Document Revised: 04/30/2016 Document Reviewed: 04/10/2016 Elsevier Interactive Patient Education  2018 [employer].

## 2017-09-30 NOTE — Progress Notes (Signed)
   HIGH-RISK PREGNANCY VISIT Patient name: Donna Kim MRN [id number]  Date of birth: 1981-05-30 Chief Complaint:   Non-stress Test  History of Present Illness:   Donna Kim is a 36 y.o. G25P2002 female at [redacted]w[redacted]d with an Estimated Date of Delivery: 11/13/17 being seen today for ongoing management of a high-risk pregnancy complicated by cholestasis of pregnancy- currently taking actigall 600mg  TID.  Today she reports decreased fm since Sat, has been able to get 5mvmt/2hr. Contractions: Not present. Vag. Bleeding: None.  Movement: (!) Decreased. denies leaking of fluid.  Review of Systems:   Pertinent items are noted in HPI Denies abnormal vaginal discharge w/ itching/odor/irritation, headaches, visual changes, shortness of breath, chest pain, abdominal pain, severe nausea/vomiting, or problems with urination or bowel movements unless otherwise stated above. Pertinent History Reviewed:  Reviewed past medical,surgical, social, obstetrical and family history.  Reviewed problem list, medications and allergies. Physical Assessment:   Vitals:   09/30/17 1028  BP: 122/70  Pulse: 88  Weight: 216 lb (98 kg)  Body mass index is 35.94 kg/m.           Physical Examination:   General appearance: alert, well appearing, and in no distress  Mental status: alert, oriented to person, place, and time  Skin: warm & dry   Extremities: Edema: None    Cardiovascular: normal heart rate noted  Respiratory: normal respiratory effort, no distress  Abdomen: gravid, soft, non-tender  Pelvic: Cervical exam deferred         Fetal Status: Fetal Heart Rate (bpm): 135 Fundal Height: 35 cm Movement: (!) Decreased    Fetal Surveillance Testing today: NST: FHR baseline 135 bpm, Variability: moderate, Accelerations:present, Decelerations:  Absent= Cat 1/Reactive    Results for orders placed or performed in visit on 09/30/17 (from the past 24 hour(s))  POCT Urinalysis Dipstick   Collection Time: 09/30/17 10:39  AM  Result Value Ref Range   Color, UA     Clarity, UA     Glucose, UA negative    Bilirubin, UA     Ketones, UA negative    Spec Grav, UA  1.010 - 1.025   Blood, UA negative    pH, UA  5.0 - 8.0   Protein, UA negative    Urobilinogen, UA  0.2 or 1.0 E.U./dL   Nitrite, UA negative    Leukocytes, UA Negative Negative    Assessment & Plan:  1) High-risk pregnancy G3P2002 at [redacted]w[redacted]d with an Estimated Date of Delivery: 11/13/17   2) ICP, stable, continue actigal 600mg  TID  3) Decreased fm, stable, reactive NST, discussed awareness/kick counts  Labs/procedures today: NST  Treatment Plan:  2x/wk testing    Deliver PRN or 37wks  Reviewed: Preterm labor symptoms and general obstetric precautions including but not limited to vaginal bleeding, contractions, leaking of fluid and fetal movement were reviewed in detail with the patient.  All questions were answered.  Follow-up: Return for As scheduled Fri for HROB, bpp/dopp u/s.  Orders Placed This Encounter  Procedures  . POCT Urinalysis Dipstick   Tawnya Crook CNM, [facility] 09/30/2017 11:31 AM

## 2017-10-01 ENCOUNTER — Ambulatory Visit (INDEPENDENT_AMBULATORY_CARE_PROVIDER_SITE_OTHER): Payer: BLUE CROSS/BLUE SHIELD | Admitting: Psychiatry

## 2017-10-01 ENCOUNTER — Encounter (HOSPITAL_COMMUNITY): Payer: Self-pay | Admitting: Psychiatry

## 2017-10-01 DIAGNOSIS — F39 Unspecified mood [affective] disorder: Secondary | ICD-10-CM

## 2017-10-01 DIAGNOSIS — F909 Attention-deficit hyperactivity disorder, unspecified type: Secondary | ICD-10-CM | POA: Diagnosis not present
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
## 2017-10-03 ENCOUNTER — Encounter: Payer: Self-pay | Admitting: Women's Health

## 2017-10-03 ENCOUNTER — Ambulatory Visit (INDEPENDENT_AMBULATORY_CARE_PROVIDER_SITE_OTHER): Payer: BLUE CROSS/BLUE SHIELD

## 2017-10-03 ENCOUNTER — Ambulatory Visit (INDEPENDENT_AMBULATORY_CARE_PROVIDER_SITE_OTHER): Payer: BLUE CROSS/BLUE SHIELD | Admitting: Women's Health

## 2017-10-03 VITALS — BP 100/70 | HR 95 | Wt 220.0 lb

## 2017-10-03 DIAGNOSIS — Z3403 Encounter for supervision of normal first pregnancy, third trimester: Secondary | ICD-10-CM

## 2017-10-03 DIAGNOSIS — K831 Obstruction of bile duct: Secondary | ICD-10-CM

## 2017-10-03 DIAGNOSIS — O26643 Intrahepatic cholestasis of pregnancy, third trimester: Secondary | ICD-10-CM

## 2017-10-03 DIAGNOSIS — O26613 Liver and biliary tract disorders in pregnancy, third trimester: Secondary | ICD-10-CM | POA: Diagnosis not present

## 2017-10-03 DIAGNOSIS — Z3A34 34 weeks gestation of pregnancy: Secondary | ICD-10-CM

## 2017-10-03 DIAGNOSIS — Z1389 Encounter for screening for other disorder: Secondary | ICD-10-CM

## 2017-10-03 DIAGNOSIS — O0993 Supervision of high risk pregnancy, unspecified, third trimester: Secondary | ICD-10-CM

## 2017-10-03 DIAGNOSIS — Z331 Pregnant state, incidental: Secondary | ICD-10-CM

## 2017-10-03 LAB — POCT URINALYSIS DIPSTICK
Glucose, UA: NEGATIVE
KETONES UA: NEGATIVE
LEUKOCYTES UA: NEGATIVE
NITRITE UA: NEGATIVE
RBC UA: NEGATIVE

## 2017-10-03 NOTE — Progress Notes (Signed)
[place] 33+5 wks,cephalic,BPP 4/5,GYBW pl gr 3,normal ovaries bilat,fhr 127 bpm,afi 15 cm,RI .60,.65=65%

## 2017-10-03 NOTE — Progress Notes (Signed)
   HIGH-RISK PREGNANCY VISIT Patient name: Donna Kim MRN [id number]  Date of birth: 1981/10/10 Chief Complaint:   Routine Prenatal Visit (ultrasound)  History of Present Illness:   Donna Kim is a 36 y.o. G72P2002 female at [redacted]w[redacted]d with an Estimated Date of Delivery: 11/13/17 being seen today for ongoing management of a high-risk pregnancy complicated by cholestasis of pregnancy, currently taking actigall 600mg  TID.  Today she reports no complaints. Contractions: Not present.  .  Movement: Present. denies leaking of fluid.  Review of Systems:   Pertinent items are noted in HPI Denies abnormal vaginal discharge w/ itching/odor/irritation, headaches, visual changes, shortness of breath, chest pain, abdominal pain, severe nausea/vomiting, or problems with urination or bowel movements unless otherwise stated above. Pertinent History Reviewed:  Reviewed past medical,surgical, social, obstetrical and family history.  Reviewed problem list, medications and allergies. Physical Assessment:   Vitals:   10/03/17 1108  BP: 100/70  Pulse: 95  Weight: 220 lb (99.8 kg)  Body mass index is 36.61 kg/m.           Physical Examination:   General appearance: alert, well appearing, and in no distress  Mental status: alert, oriented to person, place, and time  Skin: warm & dry   Extremities: Edema: None    Cardiovascular: normal heart rate noted  Respiratory: normal respiratory effort, no distress  Abdomen: gravid, soft, non-tender  Pelvic: Cervical exam deferred         Fetal Status: Fetal Heart Rate (bpm): 127 u/s Fundal Height: 35 cm Movement: Present   [place] 93+2 wks,cephalic,BPP 3/5,TDDU pl gr 3,normal ovaries bilat,fhr 127 bpm,afi 15 cm,RI .60,.65=65%  Fetal Surveillance Testing today: u/s  Results for orders placed or performed in visit on 10/03/17 (from the past 24 hour(s))  POCT urinalysis dipstick   Collection Time: 10/03/17 11:16 AM  Result Value Ref Range   Color, UA     Clarity,  UA     Glucose, UA neg    Bilirubin, UA     Ketones, UA neg    Spec Grav, UA  1.010 - 1.025   Blood, UA neg    pH, UA  5.0 - 8.0   Protein, UA trace    Urobilinogen, UA  0.2 or 1.0 E.U./dL   Nitrite, UA neg    Leukocytes, UA Negative Negative    Assessment & Plan:  1) High-risk pregnancy G3P2002 at [redacted]w[redacted]d with an Estimated Date of Delivery: 11/13/17   2) ICP, stable, continue actigall 600mg  TID  Labs/procedures today: u/s  Treatment Plan:  U/S @ dx then q 3wks  2x/wk testing nst alt w/ sono   Deliver PRN or 37wks   Reviewed: Preterm labor symptoms and general obstetric precautions including but not limited to vaginal bleeding, contractions, leaking of fluid and fetal movement were reviewed in detail with the patient.  All questions were answered.  Follow-up: Return for As scheduled Tues for HROB/NST.  Orders Placed This Encounter  Procedures  . POCT urinalysis dipstick   Tawnya Crook CNM, [facility] 10/03/2017 11:37 AM

## 2017-10-03 NOTE — Patient Instructions (Signed)
Donna Kim, I greatly value your feedback.  If you receive a survey following your visit with [place] today, we appreciate you taking the time to fill it out.  Thanks, [name], CNM, WHNP-BC   Call the office [phone]) or go to [facility] if:  You begin to have strong, frequent contractions  Your water breaks.  Sometimes it is a big gush of fluid, sometimes it is just a trickle that keeps getting your panties wet or running down your legs  You have vaginal bleeding.  It is normal to have a small amount of spotting if your cervix was checked.   You don't feel your baby moving like normal.  If you don't, get you something to eat and drink and lay down and focus on feeling your baby move.  You should feel at least 10 movements in 2 hours.  If you don't, you should call the office or go to [place] and Birth Information The normal length of a pregnancy is 39-41 weeks. Preterm labor is when labor starts before 37 completed weeks of pregnancy. What are the risk factors for preterm labor? Preterm labor is more likely to occur in women who:  Have certain infections during pregnancy such as a bladder infection, sexually transmitted infection, or infection inside the uterus (chorioamnionitis).  Have a shorter-than-normal cervix.  Have gone into preterm labor before.  Have had surgery on their cervix.  Are younger than age 39 or older than age 59.  Are African American.  Are pregnant with twins or multiple babies (multiple gestation).  Take street drugs or smoke while pregnant.  Do not gain enough weight while pregnant.  Became pregnant shortly after having been pregnant.  What are the symptoms of preterm labor? Symptoms of preterm labor include:  Cramps similar to those that can happen during a menstrual period. The cramps may happen with diarrhea.  Pain in the abdomen or lower back.  Regular uterine contractions that may feel like tightening of  the abdomen.  A feeling of increased pressure in the pelvis.  Increased watery or bloody mucus discharge from the vagina.  Water breaking (ruptured amniotic sac).  Why is it important to recognize signs of preterm labor? It is important to recognize signs of preterm labor because babies who are born prematurely may not be fully developed. This can put them at an increased risk for:  Long-term (chronic) heart and lung problems.  Difficulty immediately after birth with regulating body systems, including blood sugar, body temperature, heart rate, and breathing rate.  Bleeding in the brain.  Cerebral palsy.  Learning difficulties.  Death.  These risks are highest for babies who are born before 64 weeks of pregnancy. How is preterm labor treated? Treatment depends on the length of your pregnancy, your condition, and the health of your baby. It may involve:  Having a stitch (suture) placed in your cervix to prevent your cervix from opening too early (cerclage).  Taking or being given medicines, such as: ? Hormone medicines. These may be given early in pregnancy to help support the pregnancy. ? Medicine to stop contractions. ? Medicines to help mature the baby's lungs. These may be prescribed if the risk of delivery is high. ? Medicines to prevent your baby from developing cerebral palsy.  If the labor happens before 34 weeks of pregnancy, you may need to stay in the hospital. What should I do if I think I am in preterm labor? If you  think that you are going into preterm labor, call your health care provider right away. How can I prevent preterm labor in future pregnancies? To increase your chance of having a full-term pregnancy:  Do not use any tobacco products, such as cigarettes, chewing tobacco, and e-cigarettes. If you need help quitting, ask your health care provider.  Do not use street drugs or medicines that have not been prescribed to you during your pregnancy.  Talk  with your health care provider before taking any herbal supplements, even if you have been taking them regularly.  Make sure you gain a healthy amount of weight during your pregnancy.  Watch for infection. If you think that you might have an infection, get it checked right away.  Make sure to tell your health care provider if you have gone into preterm labor before.  This information is not intended to replace advice given to you by your health care provider. Make sure you discuss any questions you have with your health care provider. Document Released: 02/08/2004 Document Revised: 04/30/2016 Document Reviewed: 04/10/2016 Elsevier Interactive Patient Education  2018 [employer].

## 2017-10-07 ENCOUNTER — Ambulatory Visit (INDEPENDENT_AMBULATORY_CARE_PROVIDER_SITE_OTHER): Payer: BLUE CROSS/BLUE SHIELD | Admitting: Obstetrics & Gynecology

## 2017-10-07 ENCOUNTER — Encounter: Payer: Self-pay | Admitting: Obstetrics & Gynecology

## 2017-10-07 VITALS — BP 130/82 | HR 87 | Wt 220.0 lb

## 2017-10-07 DIAGNOSIS — K831 Obstruction of bile duct: Secondary | ICD-10-CM

## 2017-10-07 DIAGNOSIS — Z1389 Encounter for screening for other disorder: Secondary | ICD-10-CM

## 2017-10-07 DIAGNOSIS — O0993 Supervision of high risk pregnancy, unspecified, third trimester: Secondary | ICD-10-CM

## 2017-10-07 DIAGNOSIS — O26613 Liver and biliary tract disorders in pregnancy, third trimester: Secondary | ICD-10-CM

## 2017-10-07 DIAGNOSIS — Z331 Pregnant state, incidental: Secondary | ICD-10-CM

## 2017-10-07 DIAGNOSIS — Z3A34 34 weeks gestation of pregnancy: Secondary | ICD-10-CM

## 2017-10-07 DIAGNOSIS — O26643 Intrahepatic cholestasis of pregnancy, third trimester: Secondary | ICD-10-CM

## 2017-10-07 LAB — POCT URINALYSIS DIPSTICK
GLUCOSE UA: NEGATIVE
Ketones, UA: NEGATIVE
Leukocytes, UA: NEGATIVE
NITRITE UA: NEGATIVE
RBC UA: NEGATIVE

## 2017-10-07 NOTE — Progress Notes (Signed)
   HIGH-RISK PREGNANCY VISIT Patient name: Marlyss Cissell MRN [id number]  Date of birth: 10-10-1981 Chief Complaint:   High Risk Gestation (NST)  History of Present Illness:   Lane Kjos is a 36 y.o. G15P2002 female at [redacted]w[redacted]d with an Estimated Date of Delivery: 11/13/17 being seen today for ongoing management of a high-risk pregnancy complicated by intrahepatic cholestasis of pregnancy I am transcribed.  Today she reports constipation, some headaches and weight gain. Contractions: Irregular. Vag. Bleeding: None.  Movement: Present. denies leaking of fluid.  Review of Systems:   Pertinent items are noted in HPI Denies abnormal vaginal discharge w/ itching/odor/irritation, headaches, visual changes, shortness of breath, chest pain, abdominal pain, severe nausea/vomiting, or problems with urination or bowel movements unless otherwise stated above. Pertinent History Reviewed:  Reviewed past medical,surgical, social, obstetrical and family history.  Reviewed problem list, medications and allergies. Physical Assessment:   Vitals:   10/07/17 1049  BP: 130/82  Pulse: 87  Weight: 220 lb (99.8 kg)  Body mass index is 36.61 kg/m.           Physical Examination:   General appearance: alert, well appearing, and in no distress  Mental status: alert, oriented to person, place, and time  Skin: warm & dry   Extremities: Edema: Trace    Cardiovascular: normal heart rate noted  Respiratory: normal respiratory effort, no distress  Abdomen: gravid, soft, non-tender  Pelvic: Cervical exam deferred         Fetal Status:     Movement: Present    Fetal Surveillance Testing today: Reactive NST  Results for orders placed or performed in visit on 10/07/17 (from the past 24 hour(s))  POCT urinalysis dipstick   Collection Time: 10/07/17 10:50 AM  Result Value Ref Range   Color, UA     Clarity, UA     Glucose, UA neg    Bilirubin, UA     Ketones, UA neg    Spec Grav, UA  1.010 - 1.025   Blood, UA  neg    pH, UA  5.0 - 8.0   Protein, UA trace    Urobilinogen, UA  0.2 or 1.0 E.U./dL   Nitrite, UA neg    Leukocytes, UA Negative Negative    Assessment & Plan:  1) High-risk pregnancy G3P2002 at [redacted]w[redacted]d with an Estimated Date of Delivery: 11/13/17   2) cholestasis of pregnancy, stable on ursodiol 600 had a day    Labs/procedures today: NST  Treatment Plan: NST alternating with sonogram and a induction is scheduled for October 25, 2017 at 7 AM   Reviewed: Induction protocol etc discussed and scheduled  Follow-up: Return in about 3 days (around 10/10/2017) for BPP/sono, HROB.  Orders Placed This Encounter  Procedures  . POCT urinalysis dipstick   Rosebud Koenen H  10/07/2017 11:49 AM

## 2017-10-09 ENCOUNTER — Telehealth: Payer: Self-pay | Admitting: *Deleted

## 2017-10-09 ENCOUNTER — Inpatient Hospital Stay (HOSPITAL_COMMUNITY)
Admission: AD | Admit: 2017-10-09 | Discharge: 2017-10-09 | Disposition: A | Discharge status: Home / Self Care | Payer: BLUE CROSS/BLUE SHIELD | Source: Ambulatory Visit | Attending: Obstetrics and Gynecology | Admitting: Obstetrics and Gynecology

## 2017-10-09 ENCOUNTER — Other Ambulatory Visit: Payer: Self-pay | Admitting: Women's Health

## 2017-10-09 ENCOUNTER — Encounter (HOSPITAL_COMMUNITY): Payer: Self-pay

## 2017-10-09 DIAGNOSIS — O9989 Other specified diseases and conditions complicating pregnancy, childbirth and the puerperium: Secondary | ICD-10-CM | POA: Diagnosis not present

## 2017-10-09 DIAGNOSIS — Z3689 Encounter for other specified antenatal screening: Secondary | ICD-10-CM

## 2017-10-09 DIAGNOSIS — Z3A35 35 weeks gestation of pregnancy: Secondary | ICD-10-CM | POA: Diagnosis not present

## 2017-10-09 DIAGNOSIS — K831 Obstruction of bile duct: Secondary | ICD-10-CM

## 2017-10-09 DIAGNOSIS — Z3403 Encounter for supervision of normal first pregnancy, third trimester: Secondary | ICD-10-CM

## 2017-10-09 DIAGNOSIS — O99891 Other specified diseases and conditions complicating pregnancy: Secondary | ICD-10-CM

## 2017-10-09 DIAGNOSIS — O26613 Liver and biliary tract disorders in pregnancy, third trimester: Principal | ICD-10-CM

## 2017-10-09 DIAGNOSIS — O26893 Other specified pregnancy related conditions, third trimester: Secondary | ICD-10-CM | POA: Diagnosis not present

## 2017-10-09 DIAGNOSIS — R0602 Shortness of breath: Secondary | ICD-10-CM | POA: Diagnosis not present

## 2017-10-09 HISTORY — DX: Obstruction of bile duct: K83.1

## 2017-10-09 NOTE — MAU Provider Note (Signed)
History     CSN: [id number]  Arrival date and time: 10/09/17 1858   First Provider Initiated Contact with Patient 10/09/17 1944      Chief Complaint  Patient presents with  . Shortness of Breath   G3P2002 @35 .0 weeks here with SOB. Sx started today and are intermittent, worse with ambulating up steps and during conversations. No chest pain. No fever. Has had intermittent non-productive cough since starting Actigall weeks ago. Reports good FM. No VB, LOF, and ctx. Pt reports hx of anxiety, not on meds, denies recently anxiety attack.   OB History    Gravida Para Term Preterm AB Living   3 2 2     2    SAB TAB Ectopic Multiple Live Births           2      Past Medical History:  Diagnosis Date  . ADHD   . Back pain 11/12/2013  . Cholestasis 08/2017  . Contraceptive management 08/12/2014  . Mental disorder    anxiety  . No pertinent past medical history   . Postpartum bleeding 07/15/2014  . Rosacea   . UTI (urinary tract infection)   . Vaginal Pap smear, abnormal     Past Surgical History:  Procedure Laterality Date  . WISDOM TOOTH EXTRACTION      Family History  Adopted: Yes  Problem Relation Age of Onset  . ADD / ADHD Daughter     Social History   Tobacco Use  . Smoking status: Never Smoker  . Smokeless tobacco: Never Used  Substance Use Topics  . Alcohol use: No    Comment: occasionally wine  . Drug use: No    Allergies:  Allergies  Allergen Reactions  . Cefzil [Cefprozil]     Vomiting, nausea, itching    Medications Prior to Admission  Medication Sig Dispense Refill Last Dose  . magnesium oxide (MAG-OX) 400 MG tablet Take 200 mg by mouth daily.   Taking  . omeprazole (PRILOSEC) 20 MG capsule Take 1 capsule (20 mg total) by mouth daily. 30 capsule 6 Taking  . Prenatal Vit-Fe Fumarate-FA (PRENATAL MULTIVITAMIN) TABS tablet Take 1 tablet by mouth daily at 12 noon.   Taking  . Probiotic Product (PROBIOTIC DAILY PO) Take by mouth.   Not Taking  .  riboflavin (VITAMIN B-2) 100 MG TABS tablet Take 100 mg by mouth daily.   Not Taking  . ursodiol (ACTIGALL) 300 MG capsule 2 tablets three times daily 180 capsule 3 Taking    Review of Systems  Constitutional: Negative.   Respiratory: Positive for cough and shortness of breath. Negative for chest tightness and wheezing.   Cardiovascular: Negative for chest pain.  Gastrointestinal: Negative for abdominal pain.  Genitourinary: Negative for vaginal bleeding.   Physical Exam   Blood pressure 128/85, pulse 91, temperature 97.9 F (36.6 C), temperature source Oral, resp. rate 20, height 5\' 5"  (1.651 m), weight 221 lb 4 oz (100.4 kg), last menstrual period 02/06/2017, SpO2 100 %.  Physical Exam  Nursing note and vitals reviewed. Constitutional: She is oriented to person, place, and time. She appears well-developed and well-nourished. No distress.  HENT:  Head: Normocephalic and atraumatic.  Neck: Normal range of motion.  Cardiovascular: Normal rate, regular rhythm and normal heart sounds.  Respiratory: Effort normal and breath sounds normal. No respiratory distress. She has no wheezes. She has no rales.  GI: She exhibits no distension. There is no tenderness.  gravid  Musculoskeletal: Normal range of motion.  Neurological:  She is alert and oriented to person, place, and time.  Skin: Skin is warm and dry.  Psychiatric: She has a normal mood and affect.   No results found for this or any previous visit (from the past 24 hour(s)).  MAU Course  Procedures  MDM Lungs clear, o2 sat 100%. Low suspicion for PE or pneumonia. SOB likely physiologic to advancing pregnancy, could be exacerbated by anxiety. Stable for discharge home.  Assessment and Plan   1. [redacted] weeks gestation of pregnancy   2. Encounter for supervision of normal first pregnancy in third trimester   3. NST (non-stress test) reactive   4. Shortness of breath during pregnancy    Discharge home Follow up in [facility] office  tomorrow as scheduled Return for worsening sx  Allergies as of 10/09/2017      Reactions   Cefzil [cefprozil]    Vomiting, nausea, itching      Medication List    TAKE these medications   magnesium oxide 400 MG tablet Commonly known as:  MAG-OX Take 200 mg by mouth daily.   omeprazole 20 MG capsule Commonly known as:  PRILOSEC Take 1 capsule (20 mg total) by mouth daily.   prenatal multivitamin Tabs tablet Take 1 tablet by mouth daily at 12 noon.   PROBIOTIC DAILY PO Take by mouth.   riboflavin 100 MG Tabs tablet Commonly known as:  VITAMIN B-2 Take 100 mg by mouth daily.   ursodiol 300 MG capsule Commonly known as:  ACTIGALL 2 tablets three times daily      Julianne Handler, CNM 10/09/2017, 7:54 PM

## 2017-10-09 NOTE — MAU Note (Signed)
Urine in lab 

## 2017-10-09 NOTE — Telephone Encounter (Signed)
Pt called in complaining of some shortness of breath.  35 wks preg.  Discussed with nurse Maudie Mercury) and pt was advised to go to [facility]  10-09-17  AS

## 2017-10-09 NOTE — MAU Note (Signed)
PT  SAYS SHE BECAME  SOB   THIS AM -    AND  THEN  THIS  AFTERNOON - BECAME  TIRED.Marland Kitchen

## 2017-10-10 ENCOUNTER — Ambulatory Visit (INDEPENDENT_AMBULATORY_CARE_PROVIDER_SITE_OTHER): Payer: BLUE CROSS/BLUE SHIELD

## 2017-10-10 ENCOUNTER — Ambulatory Visit (INDEPENDENT_AMBULATORY_CARE_PROVIDER_SITE_OTHER): Payer: BLUE CROSS/BLUE SHIELD | Admitting: Obstetrics & Gynecology

## 2017-10-10 ENCOUNTER — Encounter: Payer: Self-pay | Admitting: Obstetrics & Gynecology

## 2017-10-10 VITALS — BP 120/72 | HR 79 | Wt 220.0 lb

## 2017-10-10 DIAGNOSIS — Z331 Pregnant state, incidental: Secondary | ICD-10-CM

## 2017-10-10 DIAGNOSIS — O26643 Intrahepatic cholestasis of pregnancy, third trimester: Secondary | ICD-10-CM

## 2017-10-10 DIAGNOSIS — K831 Obstruction of bile duct: Secondary | ICD-10-CM | POA: Diagnosis not present

## 2017-10-10 DIAGNOSIS — Z3A35 35 weeks gestation of pregnancy: Secondary | ICD-10-CM

## 2017-10-10 DIAGNOSIS — Z1389 Encounter for screening for other disorder: Secondary | ICD-10-CM

## 2017-10-10 DIAGNOSIS — O26613 Liver and biliary tract disorders in pregnancy, third trimester: Secondary | ICD-10-CM

## 2017-10-10 DIAGNOSIS — O0993 Supervision of high risk pregnancy, unspecified, third trimester: Secondary | ICD-10-CM

## 2017-10-10 DIAGNOSIS — Z3403 Encounter for supervision of normal first pregnancy, third trimester: Secondary | ICD-10-CM

## 2017-10-10 LAB — POCT URINALYSIS DIPSTICK
Glucose, UA: NEGATIVE
Ketones, UA: NEGATIVE
LEUKOCYTES UA: NEGATIVE
Nitrite, UA: NEGATIVE
Protein, UA: NEGATIVE
RBC UA: NEGATIVE

## 2017-10-10 NOTE — Progress Notes (Addendum)
[place] 77+0 wks,cephalic,BPP 3/4,KBT 248 BPM,posterior pl gr 3,normal ovaries bilat,afi 17 cm,

## 2017-10-10 NOTE — Progress Notes (Signed)
   HIGH-RISK PREGNANCY VISIT Patient name: Lemma Tetro MRN [id number]  Date of birth: May 28, 1981 Chief Complaint:   High Risk Gestation ([place] today; [facility] with trouble breathing; still noticing trouble breathing )  History of Present Illness:   Paisely Brick is a 36 y.o. G89P2002 female at [redacted]w[redacted]d with an Estimated Date of Delivery: 11/13/17 being seen today for ongoing management of a high-risk pregnancy complicated by cholestasis of pregnancy.  Today she reports no itching. Contractions: Irregular. Vag. Bleeding: None.  Movement: Present. denies leaking of fluid.  Review of Systems:   Pertinent items are noted in HPI Denies abnormal vaginal discharge w/ itching/odor/irritation, headaches, visual changes, shortness of breath, chest pain, abdominal pain, severe nausea/vomiting, or problems with urination or bowel movements unless otherwise stated above. Pertinent History Reviewed:  Reviewed past medical,surgical, social, obstetrical and family history.  Reviewed problem list, medications and allergies. Physical Assessment:   Vitals:   10/10/17 1217  BP: 120/72  Pulse: 79  Weight: 220 lb (99.8 kg)  Body mass index is 36.61 kg/m.           Physical Examination:   General appearance: alert, well appearing, and in no distress  Mental status: alert, oriented to person, place, and time  Skin: warm & dry   Extremities: Edema: None    Cardiovascular: normal heart rate noted  Respiratory: normal respiratory effort, no distress  Abdomen: gravid, soft, non-tender  Pelvic: Cervical exam deferred         Fetal Status:     Movement: Present    Fetal Surveillance Testing today: BPP 8/8   Results for orders placed or performed in visit on 10/10/17 (from the past 24 hour(s))  POCT urinalysis dipstick   Collection Time: 10/10/17 12:19 PM  Result Value Ref Range   Color, UA     Clarity, UA     Glucose, UA neg    Bilirubin, UA     Ketones, UA neg    Spec Grav, UA  1.010 -  1.025   Blood, UA neg    pH, UA  5.0 - 8.0   Protein, UA neg    Urobilinogen, UA  0.2 or 1.0 E.U./dL   Nitrite, UA neg    Leukocytes, UA Negative Negative    Assessment & Plan:  1) High-risk pregnancy G3P2002 at [redacted]w[redacted]d with an Estimated Date of Delivery: 11/13/17   2) Intrahepatic cholestasis of pregnancy, stable    Labs/procedures today: sonogram   Treatment Plan:  Continue twice weekly surveillance, planned induction which is scheduled already at 37 weeks, 10/25/2017  Reviewed: issues related to anxiety vs SOB  Follow-up: Return in about 4 days (around 10/14/2017) for NST, HROB.  Orders Placed This Encounter  Procedures  . POCT urinalysis dipstick   Ginni Eichler H  10/10/2017 1:23 PM

## 2017-10-13 ENCOUNTER — Telehealth (HOSPITAL_COMMUNITY): Payer: Self-pay | Admitting: *Deleted

## 2017-10-13 ENCOUNTER — Encounter (HOSPITAL_COMMUNITY): Payer: Self-pay | Admitting: *Deleted

## 2017-10-13 NOTE — Telephone Encounter (Signed)
Preadmission screen  

## 2017-10-14 ENCOUNTER — Ambulatory Visit (INDEPENDENT_AMBULATORY_CARE_PROVIDER_SITE_OTHER): Payer: BLUE CROSS/BLUE SHIELD | Admitting: Obstetrics & Gynecology

## 2017-10-14 ENCOUNTER — Encounter: Payer: Self-pay | Admitting: Obstetrics & Gynecology

## 2017-10-14 VITALS — BP 110/80 | HR 99 | Wt 221.0 lb

## 2017-10-14 DIAGNOSIS — Z331 Pregnant state, incidental: Secondary | ICD-10-CM

## 2017-10-14 DIAGNOSIS — O26613 Liver and biliary tract disorders in pregnancy, third trimester: Secondary | ICD-10-CM

## 2017-10-14 DIAGNOSIS — K831 Obstruction of bile duct: Secondary | ICD-10-CM | POA: Diagnosis not present

## 2017-10-14 DIAGNOSIS — Z3A35 35 weeks gestation of pregnancy: Secondary | ICD-10-CM | POA: Diagnosis not present

## 2017-10-14 DIAGNOSIS — Z1389 Encounter for screening for other disorder: Secondary | ICD-10-CM

## 2017-10-14 DIAGNOSIS — O0993 Supervision of high risk pregnancy, unspecified, third trimester: Secondary | ICD-10-CM

## 2017-10-14 LAB — POCT URINALYSIS DIPSTICK
GLUCOSE UA: NEGATIVE
KETONES UA: NEGATIVE
Leukocytes, UA: NEGATIVE
Nitrite, UA: NEGATIVE
Protein, UA: NEGATIVE
RBC UA: NEGATIVE

## 2017-10-14 MED ORDER — URSODIOL 300 MG PO CAPS: ORAL_CAPSULE | ORAL | 3 refills | Status: DC

## 2017-10-14 NOTE — Progress Notes (Signed)
   HIGH-RISK PREGNANCY VISIT Patient name: Donna Kim MRN [id number]  Date of birth: [birth year] Chief Complaint:   Routine Prenatal Visit (NST)  History of Present Illness:   Donna Kim is a 36 y.o. G46P2002 female at [redacted]w[redacted]d with an Estimated Date of Delivery: 11/13/17 being seen today for ongoing management of a high-risk pregnancy complicated by cholestasis of pregnancy.  Today she reports backache, no bleeding, no contractions, no cramping, no leaking and pelvic pressure. Contractions: Not present.  .  Movement: Present. denies leaking of fluid.  Review of Systems:   Pertinent items are noted in HPI Denies abnormal vaginal discharge w/ itching/odor/irritation, headaches, visual changes, shortness of breath, chest pain, abdominal pain, severe nausea/vomiting, or problems with urination or bowel movements unless otherwise stated above. Pertinent History Reviewed:  Reviewed past medical,surgical, social, obstetrical and family history.  Reviewed problem list, medications and allergies. Physical Assessment:   Vitals:   10/14/17 1040  BP: 110/80  Pulse: 99  Weight: 221 lb (100.2 kg)  Body mass index is 36.78 kg/m.           Physical Examination:   General appearance: alert, well appearing, and in no distress  Mental status: alert, oriented to person, place, and time  Skin: warm & dry   Extremities: Edema: Trace    Cardiovascular: normal heart rate noted  Respiratory: normal respiratory effort, no distress  Abdomen: gravid, soft, non-tender  Pelvic: Cervical exam deferred         Fetal Status: Fetal Heart Rate (bpm): 140 Fundal Height: 37 cm Movement: Present    Fetal Surveillance Testing today: Reactive NST   Results for orders placed or performed in visit on 10/14/17 (from the past 24 hour(s))  POCT urinalysis dipstick   Collection Time: 10/14/17 10:49 AM  Result Value Ref Range   Color, UA     Clarity, UA     Glucose, UA neg    Bilirubin, UA     Ketones, UA neg    Spec Grav, UA  1.010 - 1.025   Blood, UA neg    pH, UA  5.0 - 8.0   Protein, UA neg    Urobilinogen, UA  0.2 or 1.0 E.U./dL   Nitrite, UA neg    Leukocytes, UA Negative Negative    Assessment & Plan:  1) High-risk pregnancy G3P2002 at [redacted]w[redacted]d with an Estimated Date of Delivery: 11/13/17   2) Cholestasis of pregnancy, stable  3) Hx of postpartum depression  Labs/procedures today: NST  Treatment Plan:  Twice weekly testing, NST alternating with sonogram, induction 37 weeks, already scheduled  Reviewed: Preterm labor symptoms and general obstetric precautions including but not limited to vaginal bleeding, contractions, leaking of fluid and fetal movement were reviewed in detail with the patient.  All questions were answered.  Follow-up: Return in about 3 days (around 10/17/2017) for BPP/sono, HROB.  Orders Placed This Encounter  Procedures  . POCT urinalysis dipstick   Retha Bither H  10/14/2017 12:02 PM

## 2017-10-15 ENCOUNTER — Ambulatory Visit (HOSPITAL_COMMUNITY): Payer: Self-pay | Admitting: Psychiatry

## 2017-10-15 ENCOUNTER — Other Ambulatory Visit: Payer: Self-pay | Admitting: Obstetrics & Gynecology

## 2017-10-15 DIAGNOSIS — O26613 Liver and biliary tract disorders in pregnancy, third trimester: Secondary | ICD-10-CM

## 2017-10-15 DIAGNOSIS — K831 Obstruction of bile duct: Secondary | ICD-10-CM

## 2017-10-15 DIAGNOSIS — Z3A35 35 weeks gestation of pregnancy: Secondary | ICD-10-CM

## 2017-10-16 ENCOUNTER — Encounter: Payer: Self-pay | Admitting: Obstetrics & Gynecology

## 2017-10-17 ENCOUNTER — Ambulatory Visit (INDEPENDENT_AMBULATORY_CARE_PROVIDER_SITE_OTHER): Payer: BLUE CROSS/BLUE SHIELD

## 2017-10-17 ENCOUNTER — Encounter: Payer: Self-pay | Admitting: Obstetrics & Gynecology

## 2017-10-17 ENCOUNTER — Ambulatory Visit (INDEPENDENT_AMBULATORY_CARE_PROVIDER_SITE_OTHER): Payer: BLUE CROSS/BLUE SHIELD | Admitting: Obstetrics & Gynecology

## 2017-10-17 VITALS — BP 120/80 | HR 98 | Wt 222.0 lb

## 2017-10-17 DIAGNOSIS — Z1389 Encounter for screening for other disorder: Secondary | ICD-10-CM

## 2017-10-17 DIAGNOSIS — O26619 Liver and biliary tract disorders in pregnancy, unspecified trimester: Secondary | ICD-10-CM

## 2017-10-17 DIAGNOSIS — K831 Obstruction of bile duct: Secondary | ICD-10-CM | POA: Diagnosis not present

## 2017-10-17 DIAGNOSIS — Z3400 Encounter for supervision of normal first pregnancy, unspecified trimester: Secondary | ICD-10-CM

## 2017-10-17 DIAGNOSIS — Z331 Pregnant state, incidental: Secondary | ICD-10-CM

## 2017-10-17 DIAGNOSIS — O26613 Liver and biliary tract disorders in pregnancy, third trimester: Secondary | ICD-10-CM

## 2017-10-17 DIAGNOSIS — O26643 Intrahepatic cholestasis of pregnancy, third trimester: Secondary | ICD-10-CM

## 2017-10-17 DIAGNOSIS — Z3A35 35 weeks gestation of pregnancy: Secondary | ICD-10-CM | POA: Diagnosis not present

## 2017-10-17 DIAGNOSIS — Z3A36 36 weeks gestation of pregnancy: Secondary | ICD-10-CM

## 2017-10-17 DIAGNOSIS — O0993 Supervision of high risk pregnancy, unspecified, third trimester: Secondary | ICD-10-CM

## 2017-10-17 LAB — POCT URINALYSIS DIPSTICK
Glucose, UA: NEGATIVE
PROTEIN UA: NEGATIVE

## 2017-10-17 NOTE — Progress Notes (Signed)
   HIGH-RISK PREGNANCY VISIT Patient name: Donna Kim MRN [id number]  Date of birth: 1980-12-17 Chief Complaint:   Routine Prenatal Visit (ultrasound)  History of Present Illness:   Donna Kim is a 36 y.o. G4P2002 female at [redacted]w[redacted]d with an Estimated Date of Delivery: 11/13/17 being seen today for ongoing management of a high-risk pregnancy complicated by cholestasis of pregnancy.  Today she reports backache, no bleeding, no contractions, no cramping and no leaking. Contractions: Not present.  .  Movement: Present. denies leaking of fluid.  Review of Systems:   Pertinent items are noted in HPI Denies abnormal vaginal discharge w/ itching/odor/irritation, headaches, visual changes, shortness of breath, chest pain, abdominal pain, severe nausea/vomiting, or problems with urination or bowel movements unless otherwise stated above. Pertinent History Reviewed:  Reviewed past medical,surgical, social, obstetrical and family history.  Reviewed problem list, medications and allergies. Physical Assessment:   Vitals:   10/17/17 1111  BP: 120/80  Pulse: 98  Weight: 222 lb (100.7 kg)  Body mass index is 36.94 kg/m.           Physical Examination:   General appearance: alert, well appearing, and in no distress  Mental status: alert, oriented to person, place, and time  Skin: warm & dry   Extremities: Edema: None    Cardiovascular: normal heart rate noted  Respiratory: normal respiratory effort, no distress  Abdomen: gravid, soft, non-tender  Pelvic: Cervical exam deferred         Fetal Status: Fetal Heart Rate (bpm): 144 Fundal Height: 38 cm Movement: Present    Fetal Surveillance Testing today: BPP 8/8   No results found for this or any previous visit (from the past 24 hour(s)).  Assessment & Plan:  1) High-risk pregnancy G3P2002 at [redacted]w[redacted]d with an Estimated Date of Delivery: 11/13/17   2) Cholestasis of pregnancy, stable    Labs/procedures today: sonogram  Treatment Plan:  NST  in 4 days, induction 8 days  Reviewed: Preterm labor symptoms and general obstetric precautions including but not limited to vaginal bleeding, contractions, leaking of fluid and fetal movement were reviewed in detail with the patient.  All questions were answered.  Follow-up: Return in about 4 days (around 10/21/2017) for NST, HROB.  Orders Placed This Encounter  Procedures  . POCT urinalysis dipstick   Florian Buff CNM, [facility] 10/17/2017 11:45 AM

## 2017-10-17 NOTE — Progress Notes (Signed)
BPP [place] today. Single, active fetus in a cephalic presentation. BPP 8/8. Amniotic fluid is normal with AFI 13.6 cm.

## 2017-10-19 ENCOUNTER — Other Ambulatory Visit: Payer: Self-pay | Admitting: Family Medicine

## 2017-10-21 ENCOUNTER — Encounter: Payer: Self-pay | Admitting: Obstetrics & Gynecology

## 2017-10-21 ENCOUNTER — Other Ambulatory Visit: Payer: Self-pay

## 2017-10-21 ENCOUNTER — Ambulatory Visit (INDEPENDENT_AMBULATORY_CARE_PROVIDER_SITE_OTHER): Payer: BLUE CROSS/BLUE SHIELD | Admitting: Obstetrics & Gynecology

## 2017-10-21 VITALS — BP 132/86 | HR 73 | Wt 223.0 lb

## 2017-10-21 DIAGNOSIS — O26613 Liver and biliary tract disorders in pregnancy, third trimester: Secondary | ICD-10-CM | POA: Diagnosis not present

## 2017-10-21 DIAGNOSIS — Z3A36 36 weeks gestation of pregnancy: Secondary | ICD-10-CM | POA: Diagnosis not present

## 2017-10-21 DIAGNOSIS — K831 Obstruction of bile duct: Secondary | ICD-10-CM | POA: Diagnosis not present

## 2017-10-21 DIAGNOSIS — Z331 Pregnant state, incidental: Secondary | ICD-10-CM

## 2017-10-21 DIAGNOSIS — O0993 Supervision of high risk pregnancy, unspecified, third trimester: Secondary | ICD-10-CM

## 2017-10-21 DIAGNOSIS — Z1389 Encounter for screening for other disorder: Secondary | ICD-10-CM

## 2017-10-21 LAB — POCT URINALYSIS DIPSTICK
Blood, UA: NEGATIVE
Glucose, UA: NEGATIVE
KETONES UA: NEGATIVE
Nitrite, UA: NEGATIVE
PROTEIN UA: NEGATIVE

## 2017-10-21 NOTE — Progress Notes (Signed)
   HIGH-RISK PREGNANCY VISIT Patient name: Donna Kim MRN [id number]  Date of birth: [birth year] Chief Complaint:   High Risk Gestation (NST)  History of Present Illness:   Donna Kim is a 36 y.o. G31P2002 female at [redacted]w[redacted]d with an Estimated Date of Delivery: 11/13/17 being seen today for ongoing management of a high-risk pregnancy complicated by intrahepatic cholestasis of pregnancy.  Today she reports some increasing her itching. Contractions: Irregular. Vag. Bleeding: None.  Movement: Present. denies leaking of fluid.  Review of Systems:   Pertinent items are noted in HPI Denies abnormal vaginal discharge w/ itching/odor/irritation, headaches, visual changes, shortness of breath, chest pain, abdominal pain, severe nausea/vomiting, or problems with urination or bowel movements unless otherwise stated above. Pertinent History Reviewed:  Reviewed past medical,surgical, social, obstetrical and family history.  Reviewed problem list, medications and allergies. Physical Assessment:   Vitals:   10/21/17 1047  BP: 132/86  Pulse: 73  Weight: 223 lb (101.2 kg)  Body mass index is 37.11 kg/m.           Physical Examination:   General appearance: alert, well appearing, and in no distress  Mental status: alert, oriented to person, place, and time  Skin: warm & dry   Extremities: Edema: Trace    Cardiovascular: normal heart rate noted  Respiratory: normal respiratory effort, no distress  Abdomen: gravid, soft, non-tender  Pelvic: Cervical exam deferred         Fetal Status: Fetal Heart Rate (bpm): 140 Fundal Height: 38 cm Movement: Present    Fetal Surveillance Testing today: reactive NST   No results found for this or any previous visit (from the past 24 hour(s)).  Assessment & Plan:  1) High-risk pregnancy G3P2002 at [redacted]w[redacted]d with an Estimated Date of Delivery: 11/13/17   2) intrahepatic cholestasis of pregnancy, stable on ursodiol 6 or milligrams 3 times a day, scheduled for  induction 10/25/2017  3) thrombocytopenia pregnancy, stable and improving  Labs/procedures today: NST  Treatment Plan:  Twice weekly fetal surveillance NST alternating with sonogram Continue ursodiol 6 or milligrams 3 times a day Induction is scheduled for 10/25/2017  Reviewed: Term labor symptoms and general obstetric precautions including but not limited to vaginal bleeding, contractions, leaking of fluid and fetal movement were reviewed in detail with the patient.  All questions were answered.  Follow-up: Return in about 6 weeks (around 12/02/2017) for post partum visit.  Orders Placed This Encounter  Procedures  . POCT urinalysis dipstick   Florian Buff  10/29/2017 8:15 PM

## 2017-10-25 ENCOUNTER — Inpatient Hospital Stay (HOSPITAL_COMMUNITY): Payer: BLUE CROSS/BLUE SHIELD | Admitting: Anesthesiology

## 2017-10-25 ENCOUNTER — Encounter (HOSPITAL_COMMUNITY): Payer: Self-pay | Admitting: Anesthesiology

## 2017-10-25 ENCOUNTER — Encounter (HOSPITAL_COMMUNITY): Payer: Self-pay

## 2017-10-25 ENCOUNTER — Inpatient Hospital Stay (HOSPITAL_COMMUNITY)
Admission: RE | Admit: 2017-10-25 | Discharge: 2017-10-27 | DRG: 805 | Disposition: A | Discharge status: Home / Self Care | Payer: BLUE CROSS/BLUE SHIELD | Source: Ambulatory Visit | Attending: Obstetrics and Gynecology | Admitting: Obstetrics and Gynecology

## 2017-10-25 DIAGNOSIS — O99824 Streptococcus B carrier state complicating childbirth: Secondary | ICD-10-CM | POA: Diagnosis not present

## 2017-10-25 DIAGNOSIS — K831 Obstruction of bile duct: Secondary | ICD-10-CM | POA: Diagnosis present

## 2017-10-25 DIAGNOSIS — Z2882 Immunization not carried out because of caregiver refusal: Secondary | ICD-10-CM | POA: Diagnosis not present

## 2017-10-25 DIAGNOSIS — Z3400 Encounter for supervision of normal first pregnancy, unspecified trimester: Secondary | ICD-10-CM

## 2017-10-25 DIAGNOSIS — Z3A37 37 weeks gestation of pregnancy: Secondary | ICD-10-CM

## 2017-10-25 DIAGNOSIS — O2662 Liver and biliary tract disorders in childbirth: Secondary | ICD-10-CM | POA: Diagnosis not present

## 2017-10-25 LAB — CBC
HCT: 40.2 % (ref 36.0–46.0)
HCT: 39.3 % (ref 36.0–46.0)
Hemoglobin: 13.3 g/dL (ref 12.0–15.0)
Hemoglobin: 13.1 g/dL (ref 12.0–15.0)
MCH: 30.6 pg (ref 26.0–34.0)
MCH: 30.6 pg (ref 26.0–34.0)
MCHC: 33.1 g/dL (ref 30.0–36.0)
MCHC: 33.3 g/dL (ref 30.0–36.0)
MCV: 92.6 fL (ref 78.0–100.0)
MCV: 91.8 fL (ref 78.0–100.0)
PLATELETS: 117 10*3/uL — AB (ref 150–400)
Platelets: 113 10*3/uL — ABNORMAL LOW (ref 150–400)
RBC: 4.34 MIL/uL (ref 3.87–5.11)
RBC: 4.28 MIL/uL (ref 3.87–5.11)
RDW: 14 % (ref 11.5–15.5)
RDW: 14 % (ref 11.5–15.5)
WBC: 9.3 10*3/uL (ref 4.0–10.5)
WBC: 15.7 10*3/uL — ABNORMAL HIGH (ref 4.0–10.5)

## 2017-10-25 LAB — TYPE AND SCREEN
ABO/RH(D): O POS
ANTIBODY SCREEN: NEGATIVE

## 2017-10-25 LAB — ABO/RH: ABO/RH(D): O POS

## 2017-10-25 MED ORDER — OXYTOCIN 40 UNITS IN LACTATED RINGERS INFUSION - SIMPLE MED
1.0000 m[IU]/min | INTRAVENOUS | Status: DC
  Administered 2017-10-25: 2 m[IU]/min via INTRAVENOUS
  Filled 2017-10-25: qty 1000

## 2017-10-25 MED ORDER — OXYCODONE-ACETAMINOPHEN 5-325 MG PO TABS: 2.0000 | ORAL_TABLET | ORAL | Status: DC | PRN

## 2017-10-25 MED ORDER — PHENYLEPHRINE 40 MCG/ML (10ML) SYRINGE FOR IV PUSH (FOR BLOOD PRESSURE SUPPORT)
80.0000 ug | PREFILLED_SYRINGE | INTRAVENOUS | Status: DC | PRN
  Filled 2017-10-25: qty 5
  Filled 2017-10-25: qty 10

## 2017-10-25 MED ORDER — PENICILLIN G POT IN DEXTROSE 60000 UNIT/ML IV SOLN
3.0000 10*6.[IU] | INTRAVENOUS | Status: DC
  Administered 2017-10-25 (×2): 3 10*6.[IU] via INTRAVENOUS
  Filled 2017-10-25 (×7): qty 50

## 2017-10-25 MED ORDER — LIDOCAINE HCL (PF) 1 % IJ SOLN
30.0000 mL | INTRAMUSCULAR | Status: DC | PRN
  Filled 2017-10-25: qty 30

## 2017-10-25 MED ORDER — LIDOCAINE HCL (PF) 1 % IJ SOLN
INTRAMUSCULAR | Status: DC | PRN
  Administered 2017-10-25: 13 mL via EPIDURAL

## 2017-10-25 MED ORDER — LACTATED RINGERS IV SOLN
INTRAVENOUS | Status: DC
  Administered 2017-10-25 (×2): via INTRAVENOUS

## 2017-10-25 MED ORDER — URSODIOL 300 MG PO CAPS
600.0000 mg | ORAL_CAPSULE | Freq: Two times a day (BID) | ORAL | Status: DC
  Administered 2017-10-25 (×2): 600 mg via ORAL
  Filled 2017-10-25 (×3): qty 2

## 2017-10-25 MED ORDER — LACTATED RINGERS IV SOLN
500.0000 mL | Freq: Once | INTRAVENOUS | Status: AC
  Administered 2017-10-25: 500 mL via INTRAVENOUS

## 2017-10-25 MED ORDER — FLEET ENEMA 7-19 GM/118ML RE ENEM: 1.0000 | ENEMA | RECTAL | Status: DC | PRN

## 2017-10-25 MED ORDER — EPHEDRINE 5 MG/ML INJ
10.0000 mg | INTRAVENOUS | Status: DC | PRN
  Filled 2017-10-25: qty 2

## 2017-10-25 MED ORDER — ACETAMINOPHEN 325 MG PO TABS
650.0000 mg | ORAL_TABLET | ORAL | Status: DC | PRN
  Administered 2017-10-25: 650 mg via ORAL
  Filled 2017-10-25: qty 2

## 2017-10-25 MED ORDER — DIPHENHYDRAMINE HCL 50 MG/ML IJ SOLN: 12.5000 mg | INTRAMUSCULAR | Status: DC | PRN

## 2017-10-25 MED ORDER — MISOPROSTOL 50MCG HALF TABLET: 50.0000 ug | ORAL_TABLET | ORAL | Status: DC | PRN

## 2017-10-25 MED ORDER — TERBUTALINE SULFATE 1 MG/ML IJ SOLN
0.2500 mg | Freq: Once | INTRAMUSCULAR | Status: DC | PRN
  Filled 2017-10-25: qty 1

## 2017-10-25 MED ORDER — ONDANSETRON HCL 4 MG/2ML IJ SOLN: 4.0000 mg | Freq: Four times a day (QID) | INTRAMUSCULAR | Status: DC | PRN

## 2017-10-25 MED ORDER — OXYTOCIN 40 UNITS IN LACTATED RINGERS INFUSION - SIMPLE MED: 2.5000 [IU]/h | INTRAVENOUS | Status: DC

## 2017-10-25 MED ORDER — LACTATED RINGERS IV SOLN
500.0000 mL | INTRAVENOUS | Status: DC | PRN
  Administered 2017-10-25: 500 mL via INTRAVENOUS

## 2017-10-25 MED ORDER — FENTANYL CITRATE (PF) 100 MCG/2ML IJ SOLN: 100.0000 ug | INTRAMUSCULAR | Status: DC | PRN

## 2017-10-25 MED ORDER — PHENYLEPHRINE 40 MCG/ML (10ML) SYRINGE FOR IV PUSH (FOR BLOOD PRESSURE SUPPORT)
80.0000 ug | PREFILLED_SYRINGE | INTRAVENOUS | Status: DC | PRN
  Filled 2017-10-25: qty 5

## 2017-10-25 MED ORDER — OXYCODONE-ACETAMINOPHEN 5-325 MG PO TABS: 1.0000 | ORAL_TABLET | ORAL | Status: DC | PRN

## 2017-10-25 MED ORDER — OXYTOCIN BOLUS FROM INFUSION
500.0000 mL | Freq: Once | INTRAVENOUS | Status: AC
  Administered 2017-10-25: 500 mL via INTRAVENOUS

## 2017-10-25 MED ORDER — SOD CITRATE-CITRIC ACID 500-334 MG/5ML PO SOLN: 30.0000 mL | ORAL | Status: DC | PRN

## 2017-10-25 MED ORDER — IBUPROFEN 600 MG PO TABS
600.0000 mg | ORAL_TABLET | Freq: Four times a day (QID) | ORAL | Status: DC
  Administered 2017-10-26 – 2017-10-27 (×7): 600 mg via ORAL
  Filled 2017-10-25 (×7): qty 1

## 2017-10-25 MED ORDER — FENTANYL 2.5 MCG/ML BUPIVACAINE 1/10 % EPIDURAL INFUSION (WH - ANES)
14.0000 mL/h | INTRAMUSCULAR | Status: DC | PRN
  Administered 2017-10-25 (×2): 14 mL/h via EPIDURAL
  Filled 2017-10-25: qty 100

## 2017-10-25 MED ORDER — PENICILLIN G POTASSIUM 5000000 UNITS IJ SOLR
5.0000 10*6.[IU] | Freq: Once | INTRAMUSCULAR | Status: AC
  Administered 2017-10-25: 5 10*6.[IU] via INTRAVENOUS
  Filled 2017-10-25: qty 5

## 2017-10-25 NOTE — Progress Notes (Signed)
Patient ID: Donna Kim, female   DOB: 1981/02/25, 36 y.o.   MRN: [id number]  Pt mostly comfortable w/ epidural; AROM @ 1850 for min clear fluid (was 4-5cm at that time)  BP 146/81, other VSS FHR 130s, +accels, occ variables, no decels Ctx q 2-3 mins with Pit at 74mu/min Cx 9/C/vtx -1  IUP@term  Cholestasis Active labor/transition  Will check cx in 2 hrs or sooner prn Anticipate SVD  Serita Grammes CNM 10/25/2017 8:57 PM

## 2017-10-25 NOTE — Anesthesia Pain Management Evaluation Note (Signed)
  CRNA Pain Management Visit Note  Patient: Donna Kim, 36 y.o., female  "Hello I am a member of the anesthesia team at [facility]. We have an anesthesia team available at all times to provide care throughout the hospital, including epidural management and anesthesia for C-section. I don't know your plan for the delivery whether it a natural birth, water birth, IV sedation, nitrous supplementation, doula or epidural, but we want to meet your pain goals."   1.Was your pain managed to your expectations on prior hospitalizations?   Yes   2.What is your expectation for pain management during this hospitalization?     Epidural  3.How can we help you reach that goal? unsure  Record the patient's initial score and the patient's pain goal.   Pain: 0  Pain Goal: 8 The [facility] wants you to be able to say your pain was always managed very well.  Casimer Lanius 10/25/2017

## 2017-10-25 NOTE — Progress Notes (Signed)
Patient ID: Donna Kim, female   DOB: [birth year], 36 y.o.   MRN: [id number]  Doing well with Pit; feeling a little crampy  VSS, afeb FHR 130s, +accels, no decels Ctx irreg Cx deferred (was 3/50/-2)  IUP@37 .2wks Cholestasis Cx favorable GBS pos  PCN for GBS ppx Continue to increase Pit to achieve reg ctx Anticipate SVD  Serita Grammes CNM 10/25/2017 11:03 AM

## 2017-10-25 NOTE — Anesthesia Procedure Notes (Signed)
Epidural Patient location during procedure: OB Start time: 10/25/2017 5:02 PM End time: 10/25/2017 5:18 PM  Staffing Anesthesiologist: Lynda Rainwater, MD Performed: anesthesiologist   Preanesthetic Checklist Completed: patient identified, site marked, surgical consent, pre-op evaluation, timeout performed, IV checked, risks and benefits discussed and monitors and equipment checked  Epidural Patient position: sitting Prep: ChloraPrep Patient monitoring: heart rate, cardiac monitor, continuous pulse ox and blood pressure Approach: midline Location: L2-L3 Injection technique: LOR saline  Needle:  Needle type: Tuohy  Needle gauge: 17 G Needle length: 9 cm Needle insertion depth: 5 cm Catheter type: closed end flexible Catheter size: 20 Guage Catheter at skin depth: 9 cm Test dose: negative  Assessment Events: blood not aspirated, injection not painful, no injection resistance, negative IV test and no paresthesia  Additional Notes Reason for block:procedure for pain

## 2017-10-25 NOTE — H&P (Signed)
Obstetric History and Physical  Donna Kim is a 36 y.o. [id number] with IUP at [redacted]w[redacted]d presenting for IOL for cholestasis of pregnancy. Admits to intermittent contractions.  Prenatal Course Source of Care: [facility]  with onset of care at 9 weeks Pregnancy complications or risks: Patient Active Problem List   Diagnosis Date Noted  . Cholestasis of pregnancy, antepartum 08/28/2017  . GBS bacteriuria 04/16/2017  . Supervision of normal pregnancy 04/14/2017  . Anxiety 04/14/2017  . Depression, postpartum, postpartum condition 12/17/2014  . Insomnia 12/17/2014  . ADHD (attention deficit hyperactivity disorder), inattentive type 10/02/2014  . Rosacea, acne 03/18/2014   She plans to breastfeed. She desires condoms for postpartum contraception.    [place]  Initiated Care at  9wk  FOB Shirlee More  Dating By LMP, U/S 6wk  Pap 04/14/17 neg  GC/CT Initial:   -/-             36+wks:  Genetic Screen NT/IT: neg  CF screen declined  Anatomic [place] Normal female  Flu vaccine 08/19/17   Tdap Recommended ~ 28wks  Glucose Screen  2 hr  81/91/113  GBS  + in urine  Feed Preference breast  Contraception condoms  Circumcision n/a  Childbirth Classes declined  Pediatrician [place]    Prenatal Transfer Tool  Maternal Diabetes: No Genetic Screening: Normal Maternal Ultrasounds/Referrals: Normal Fetal Ultrasounds or other Referrals:  None Maternal Substance Abuse:  No Significant Maternal Medications:  None Significant Maternal Lab Results: Lab values include: Group B Strep positive  Past Medical History:  Diagnosis Date  . ADHD   . Back pain 11/12/2013  . Cholestasis 08/2017  . Contraceptive management 08/12/2014  . Mental disorder    anxiety  . No pertinent past medical history   . Postpartum bleeding 07/15/2014  . Rosacea   . UTI (urinary tract infection)   . Vaginal Pap smear, abnormal     Past Surgical History:  Procedure Laterality Date  . WISDOM  TOOTH EXTRACTION      OB History  Gravida Para Term Preterm AB Living  3 2 2     2   SAB TAB Ectopic Multiple Live Births          2    # Outcome Date GA Lbr Len/2nd Weight Sex Delivery Anes PTL Lv  3 Current           2 Term 07/04/14 [redacted]w[redacted]d 01:59 / 00:08 8 lb 15.4 oz (4.065 kg) F Vag-Spont None N LIV  1 Term 01/02/12 [redacted]w[redacted]d 10:50 / 05:14 7 lb 2.6 oz (3.249 kg) F Vag-Spont EPI N LIV     Birth Comments: none      Social History   Socioeconomic History  . Marital status: Married    Spouse name: Not on file  . Number of children: Not on file  . Years of education: Not on file  . Highest education level: Not on file  Social Needs  . Financial resource strain: Not on file  . Food insecurity - worry: Not on file  . Food insecurity - inability: Not on file  . Transportation needs - medical: Not on file  . Transportation needs - non-medical: Not on file  Occupational History  . Not on file  Tobacco Use  . Smoking status: Never Smoker  . Smokeless tobacco: Never Used  Substance and Sexual Activity  . Alcohol use: No    Comment: occasionally wine  . Drug use: No  . Sexual activity: Yes  Birth control/protection: None  Other Topics Concern  . Not on file  Social History Narrative  . Not on file    Family History  Adopted: Yes  Problem Relation Age of Onset  . ADD / ADHD Daughter     Medications Prior to Admission  Medication Sig Dispense Refill Last Dose  . magnesium oxide (MAG-OX) 400 MG tablet Take 200 mg by mouth daily.   Taking  . omeprazole (PRILOSEC) 20 MG capsule Take 1 capsule (20 mg total) by mouth daily. (Patient taking differently: Take 20 mg as needed by mouth. ) 30 capsule 6 Taking  . Prenatal Vit-Fe Fumarate-FA (PRENATAL MULTIVITAMIN) TABS tablet Take 1 tablet by mouth daily at 12 noon.   Taking  . riboflavin (VITAMIN B-2) 100 MG TABS tablet Take 100 mg as needed by mouth.    Not Taking  . ursodiol (ACTIGALL) 300 MG capsule 2 tablets three times daily 180  capsule 3 Taking    Allergies  Allergen Reactions  . Cefzil [Cefprozil]     Vomiting, nausea, itching    Review of Systems: Negative except for what is mentioned in HPI.  Physical Exam: LMP 02/06/2017 (Exact Date)  CONSTITUTIONAL: Well-developed, well-nourished female in no acute distress.  HENT:  Normocephalic, atraumatic, External right and left ear normal. Oropharynx is clear and moist EYES: Conjunctivae and EOM are normal. Pupils are equal, round, and reactive to light. No scleral icterus.  NECK: Normal range of motion, supple, no masses SKIN: Skin is warm and dry. No rash noted. Not diaphoretic. No erythema. No pallor. NEUROLOGIC: Alert and oriented to person, place, and time. Normal reflexes, muscle tone coordination. No cranial nerve deficit noted. PSYCHIATRIC: Normal mood and affect. Normal behavior. Normal judgment and thought content. CARDIOVASCULAR: Normal heart rate noted, regular rhythm RESPIRATORY: Effort and breath sounds normal, no problems with respiration noted ABDOMEN: Soft, nontender, nondistended, gravid. MUSCULOSKELETAL: Normal range of motion. No edema and no tenderness. 2+ distal pulses.  Cervical Exam: Dilatation 3cm   Effacement 50%   Station -2   Presentation: cephalic FHT:  Baseline rate 130 bpm   Variability moderate  Accelerations present   Decelerations none   Pertinent Labs/Studies:   No results found for this or any previous visit (from the past 24 hour(s)).  Assessment : Donna Kim is a 36 y.o. G3P2002 at [redacted]w[redacted]d being admitted for induction of labor due to cholestasis of pregnancy.  Plan: Labor:  Induction/Augmentation as ordered as per protocol. Allow patient to eat breakfast, then start pitocin. Analgesia as needed. FWB: Reassuring fetal heart tracing.  GBS positive-PCN Delivery plan: Hopeful for vaginal delivery MOF: breast MOC: condoms  [employer], DO

## 2017-10-25 NOTE — Anesthesia Preprocedure Evaluation (Signed)
Anesthesia Evaluation  Patient identified by MRN, date of birth, ID band Patient awake    Reviewed: Allergy & Precautions, H&P , Patient's Chart, lab work & pertinent test results  Airway Mallampati: II  TM Distance: >3 FB Neck ROM: full    Dental no notable dental hx.    Pulmonary neg pulmonary ROS,    Pulmonary exam normal breath sounds clear to auscultation       Cardiovascular negative cardio ROS   Rhythm:regular Rate:Normal     Neuro/Psych negative neurological ROS  negative psych ROS   GI/Hepatic negative GI ROS, Neg liver ROS,   Endo/Other  negative endocrine ROS  Renal/GU negative Renal ROS     Musculoskeletal   Abdominal   Peds  Hematology negative hematology ROS (+)   Anesthesia Other Findings   Reproductive/Obstetrics (+) Pregnancy                             Anesthesia Physical  Anesthesia Plan  ASA: II  Anesthesia Plan: Epidural   Post-op Pain Management:    Induction:   PONV Risk Score and Plan:   Airway Management Planned:   Additional Equipment:   Intra-op Plan:   Post-operative Plan:   Informed Consent: I have reviewed the patients History and Physical, chart, labs and discussed the procedure including the risks, benefits and alternatives for the proposed anesthesia with the patient or authorized representative who has indicated his/her understanding and acceptance.     Plan Discussed with:   Anesthesia Plan Comments:         Anesthesia Quick Evaluation

## 2017-10-26 LAB — COMPREHENSIVE METABOLIC PANEL
ALBUMIN: 2.6 g/dL — AB (ref 3.5–5.0)
ALK PHOS: 106 U/L (ref 38–126)
ALT: 13 U/L — AB (ref 14–54)
ANION GAP: 8 (ref 5–15)
AST: 19 U/L (ref 15–41)
BILIRUBIN TOTAL: 0.7 mg/dL (ref 0.3–1.2)
BUN: 9 mg/dL (ref 6–20)
CALCIUM: 9.2 mg/dL (ref 8.9–10.3)
CO2: 22 mmol/L (ref 22–32)
CREATININE: 0.79 mg/dL (ref 0.44–1.00)
Chloride: 109 mmol/L (ref 101–111)
GFR calc non Af Amer: >60 mL/min (ref >60)
GLUCOSE: 105 mg/dL — AB (ref 65–99)
Potassium: 4 mmol/L (ref 3.5–5.1)
Sodium: 139 mmol/L (ref 135–145)
TOTAL PROTEIN: 6.3 g/dL — AB (ref 6.5–8.1)

## 2017-10-26 LAB — CBC WITH DIFFERENTIAL/PLATELET
Basophils Absolute: 0 10*3/uL (ref 0.0–0.1)
Basophils Relative: 0 %
Eosinophils Absolute: 0 10*3/uL (ref 0.0–0.7)
Eosinophils Relative: 0 %
HEMATOCRIT: 40.2 % (ref 36.0–46.0)
HEMOGLOBIN: 13.2 g/dL (ref 12.0–15.0)
LYMPHS ABS: 1.4 10*3/uL (ref 0.7–4.0)
Lymphocytes Relative: 11 %
MCH: 30.3 pg (ref 26.0–34.0)
MCHC: 32.8 g/dL (ref 30.0–36.0)
MCV: 92.2 fL (ref 78.0–100.0)
MONOS PCT: 3 %
Monocytes Absolute: 0.4 10*3/uL (ref 0.1–1.0)
NEUTROS ABS: 10.4 10*3/uL — AB (ref 1.7–7.7)
NEUTROS PCT: 85 %
Platelets: 108 10*3/uL — ABNORMAL LOW (ref 150–400)
RBC: 4.36 MIL/uL (ref 3.87–5.11)
RDW: 14.1 % (ref 11.5–15.5)
WBC: 12.2 10*3/uL — ABNORMAL HIGH (ref 4.0–10.5)

## 2017-10-26 LAB — PROTEIN / CREATININE RATIO, URINE
Creatinine, Urine: 85 mg/dL
PROTEIN CREATININE RATIO: 0.08 mg/mg{creat} (ref 0.00–0.15)
Total Protein, Urine: 7 mg/dL

## 2017-10-26 LAB — RPR: RPR Ser Ql: NONREACTIVE

## 2017-10-26 MED ORDER — DIBUCAINE 1 % RE OINT: 1.0000 "application " | TOPICAL_OINTMENT | RECTAL | Status: DC | PRN

## 2017-10-26 MED ORDER — BENZOCAINE-MENTHOL 20-0.5 % EX AERO: 1.0000 "application " | INHALATION_SPRAY | CUTANEOUS | Status: DC | PRN

## 2017-10-26 MED ORDER — ZOLPIDEM TARTRATE 5 MG PO TABS: 5.0000 mg | ORAL_TABLET | Freq: Every evening | ORAL | Status: DC | PRN

## 2017-10-26 MED ORDER — ACETAMINOPHEN 325 MG PO TABS: 650.0000 mg | ORAL_TABLET | ORAL | Status: DC | PRN

## 2017-10-26 MED ORDER — MAGNESIUM HYDROXIDE 400 MG/5ML PO SUSP: 30.0000 mL | ORAL | Status: DC | PRN

## 2017-10-26 MED ORDER — ENOXAPARIN SODIUM 40 MG/0.4ML ~~LOC~~ SOLN: 40.0000 mg | SUBCUTANEOUS | Status: DC

## 2017-10-26 MED ORDER — ONDANSETRON HCL 4 MG/2ML IJ SOLN: 4.0000 mg | INTRAMUSCULAR | Status: DC | PRN

## 2017-10-26 MED ORDER — OXYCODONE HCL 5 MG PO TABS: 10.0000 mg | ORAL_TABLET | ORAL | Status: DC | PRN

## 2017-10-26 MED ORDER — PRENATAL MULTIVITAMIN CH
1.0000 | ORAL_TABLET | Freq: Every day | ORAL | Status: DC
  Administered 2017-10-27: 1 via ORAL
  Filled 2017-10-26: qty 1

## 2017-10-26 MED ORDER — WITCH HAZEL-GLYCERIN EX PADS: 1.0000 "application " | MEDICATED_PAD | CUTANEOUS | Status: DC | PRN

## 2017-10-26 MED ORDER — DIPHENHYDRAMINE HCL 25 MG PO CAPS: 25.0000 mg | ORAL_CAPSULE | Freq: Four times a day (QID) | ORAL | Status: DC | PRN

## 2017-10-26 MED ORDER — OXYCODONE-ACETAMINOPHEN 5-325 MG PO TABS: 1.0000 | ORAL_TABLET | ORAL | Status: DC | PRN

## 2017-10-26 MED ORDER — ONDANSETRON HCL 4 MG PO TABS: 4.0000 mg | ORAL_TABLET | ORAL | Status: DC | PRN

## 2017-10-26 MED ORDER — MEASLES, MUMPS & RUBELLA VAC ~~LOC~~ INJ
0.5000 mL | INJECTION | Freq: Once | SUBCUTANEOUS | Status: DC
  Filled 2017-10-26: qty 0.5

## 2017-10-26 MED ORDER — COCONUT OIL OIL: 1.0000 "application " | TOPICAL_OIL | Status: DC | PRN

## 2017-10-26 MED ORDER — SENNOSIDES-DOCUSATE SODIUM 8.6-50 MG PO TABS
2.0000 | ORAL_TABLET | ORAL | Status: DC
  Administered 2017-10-26: 2 via ORAL
  Filled 2017-10-26: qty 2

## 2017-10-26 MED ORDER — TETANUS-DIPHTH-ACELL PERTUSSIS 5-2.5-18.5 LF-MCG/0.5 IM SUSP: 0.5000 mL | Freq: Once | INTRAMUSCULAR | Status: DC

## 2017-10-26 MED ORDER — FERROUS SULFATE 325 (65 FE) MG PO TABS: 325.0000 mg | ORAL_TABLET | Freq: Two times a day (BID) | ORAL | Status: DC

## 2017-10-26 MED ORDER — SIMETHICONE 80 MG PO CHEW: 80.0000 mg | CHEWABLE_TABLET | ORAL | Status: DC | PRN

## 2017-10-26 MED ORDER — PRENATAL MULTIVITAMIN CH
1.0000 | ORAL_TABLET | Freq: Every day | ORAL | Status: DC
  Administered 2017-10-26: 1 via ORAL
  Filled 2017-10-26: qty 1

## 2017-10-26 MED ORDER — OXYCODONE-ACETAMINOPHEN 5-325 MG PO TABS: 2.0000 | ORAL_TABLET | ORAL | Status: DC | PRN

## 2017-10-26 MED ORDER — OXYCODONE HCL 5 MG PO TABS: 5.0000 mg | ORAL_TABLET | ORAL | Status: DC | PRN

## 2017-10-26 NOTE — Anesthesia Postprocedure Evaluation (Signed)
Anesthesia Post Note  Patient: Donna Kim  Procedure(s) Performed: AN AD HOC LABOR EPIDURAL     Patient location during evaluation: Mother Baby Anesthesia Type: Epidural Level of consciousness: awake and alert Pain management: pain level controlled Vital Signs Assessment: post-procedure vital signs reviewed and stable Respiratory status: spontaneous breathing, nonlabored ventilation and respiratory function stable Cardiovascular status: stable Postop Assessment: no headache, no backache, epidural receding and patient able to bend at knees Anesthetic complications: no    Last Vitals:  Vitals:   10/26/17 0516 10/26/17 1739  BP: 112/69 131/85  Pulse: 63 75  Resp: 18 20  Temp: 36.7 C 37.2 C  SpO2: 95%     Last Pain:  Vitals:   10/26/17 1736  TempSrc:   PainSc: 0-No pain   Pain Goal:                 Rayvon Char

## 2017-10-26 NOTE — Lactation Note (Signed)
This note was copied from a baby's chart. Lactation Consultation Note: Mother is an experienced breastfeeding mother. Mother breastfed for 9-12 months with her other two daughters.  Staff nurse sat mother up with a DEBP.  Assist mother  with pumping when I arrived in room. Mother fit with a #27 flange. Mother has a tiny crack on the left nipple,observed bleeding. Nipples are pink .  Mother was given comfort gels.   Infant is 37.2 weeks and is at 5-11 ounces. Infant has had difficulty with low blood sugars. MD order to supplement infant with 15 ml of formula every 2-3 hours.   Infant was given 2-3 ml of formula with gloved finger and curved tip syringe. Infant placed in football hold and was given 3 ml . Infant chomped down on the tip of mother's nipple and mother pulled infant off breast.   Infant spit up large amt of undigested formula.   Mother advised to page for staff assistance with feedings and supplementing as needed.  Mother was given Lactation brochure with information on all [place] services.   Patient Name: Donna Kim [id number] Date: 10/26/2017 Reason for consult: Initial assessment   Maternal Data Has patient been taught Hand Expression?: Yes Does the patient have breastfeeding experience prior to this delivery?: Yes  Feeding Feeding Type: Breast Fed Length of feed: 5 min  LATCH Score Latch: Grasps breast easily, tongue down, lips flanged, rhythmical sucking.  Audible Swallowing: A few with stimulation  Type of Nipple: Everted at rest and after stimulation  Comfort (Breast/Nipple): Filling, red/small blisters or bruises, mild/mod discomfort(left nipple bleeding with tiny crack)  Hold (Positioning): No assistance needed to correctly position infant at breast.  LATCH Score: 8  Interventions Interventions: Assisted with latch;Skin to skin;Adjust position;Support pillows;Position options;Comfort gels;DEBP  Lactation Tools Discussed/Used     Consult  Status Consult Status: Follow-up Date: 10/26/17 Follow-up type: In-patient    Jess Barters [facility] 10/26/2017, 3:19 PM

## 2017-10-26 NOTE — Progress Notes (Signed)
POSTPARTUM PROGRESS NOTE  Post Partum Day 1 Subjective:  Donna Kim is a 36 y.o. [id number] [redacted]w[redacted]d s/p SVD.  No acute events overnight.  Pt denies problems with ambulating, voiding or po intake.  She denies nausea or vomiting.  Pain is well controlled.  She has had flatus. She has not had bowel movement.  Lochia Minimal.   Objective: Blood pressure 112/69, pulse 63, temperature 98 F (36.7 C), temperature source Oral, resp. rate 18, height 5\' 5"  (1.651 m), weight 223 lb (101.2 kg), last menstrual period 02/06/2017, SpO2 95 %, unknown if currently breastfeeding.  Physical Exam:  General: alert, cooperative and no distress Lochia:normal flow Chest: no respiratory distress Heart:regular rate, distal pulses intact Abdomen: soft, nontender,  Uterine Fundus: firm, appropriately tender DVT Evaluation: No calf swelling or tenderness Extremities: no  edema  Recent Labs    10/25/17 0800 10/25/17 2307  HGB 13.3 13.1  HCT 40.2 39.3    Assessment/Plan:  ASSESSMENT: Donna Kim is a 36 y.o. G3P3003 [redacted]w[redacted]d s/p SVD  Plan for discharge tomorrow   LOS: 1 day   Kevron Patella BlandDO 10/26/2017, 7:54 AM

## 2017-10-26 NOTE — Lactation Note (Signed)
This note was copied from a baby's chart. Lactation Consultation Note  Patient Name: Donna Kim [id number] Date: 10/26/2017 Reason for consult: Follow-up assessment  Mom breastfeeding in cradle hold.  Baby observed to have a wide, deep latch.  Mom wanting to pull breast away from baby's nose.  Repeatedly reminded Mom to adjust baby so her chin was closer in than her nose.  Demonstrated and assisted how to use alternate breast compression to increase milk transfer. Reviewed how to supplement using the curved tip syringe.  Mom had been using it in the corner of baby's mouth.  Recommended she have baby suckle on finger, introducing the curved tip facing finger, introducing supplement only when baby is sucking. Encouraged Mom to continue double pumping after breastfeeding. Mom to awaken baby for feedings at 3 hrs during the night, feeding her sooner if she awakens earlier.  Mom and FOB look exhausted. Praised parents for doing such a good job.  They are grateful that baby is breastfeeding better. To call for help as needed.   Feeding Feeding Type: Breast Fed Length of feed: 15 min  LATCH Score Latch: Grasps breast easily, tongue down, lips flanged, rhythmical sucking.  Audible Swallowing: A few with stimulation  Type of Nipple: Everted at rest and after stimulation  Comfort (Breast/Nipple): Filling, red/small blisters or bruises, mild/mod discomfort  Hold (Positioning): No assistance needed to correctly position infant at breast.  LATCH Score: 8  Interventions Interventions: Breast feeding basics reviewed;Breast compression;Support pillows  Lactation Tools Discussed/Used Tools: Pump Breast pump type: Double-Electric Breast Pump   Consult Status Consult Status: Follow-up Date: 10/27/17 Follow-up type: In-patient    [name] 10/26/2017, 10:49 PM

## 2017-10-26 NOTE — Progress Notes (Signed)
CSW acknowledges consult.  CSW attempted to meet with MOB, however MOB had several room guest.  CSW will attempt to visit with MOB at a later time.   Javonnie Illescas Boyd-Gilyard, MSW, LCSW Clinical Social Work [phone]  

## 2017-10-27 ENCOUNTER — Other Ambulatory Visit: Payer: Self-pay

## 2017-10-27 LAB — CBC
HEMATOCRIT: 39.5 % (ref 36.0–46.0)
Hemoglobin: 12.8 g/dL (ref 12.0–15.0)
MCH: 30.5 pg (ref 26.0–34.0)
MCHC: 32.4 g/dL (ref 30.0–36.0)
MCV: 94 fL (ref 78.0–100.0)
Platelets: 101 10*3/uL — ABNORMAL LOW (ref 150–400)
RBC: 4.2 MIL/uL (ref 3.87–5.11)
RDW: 14.6 % (ref 11.5–15.5)
WBC: 9.2 10*3/uL (ref 4.0–10.5)

## 2017-10-27 MED ORDER — IBUPROFEN 600 MG PO TABS: 600.0000 mg | ORAL_TABLET | Freq: Four times a day (QID) | ORAL | 0 refills | Status: DC

## 2017-10-27 MED ORDER — SENNOSIDES-DOCUSATE SODIUM 8.6-50 MG PO TABS: 2.0000 | ORAL_TABLET | Freq: Every evening | ORAL | 1 refills | Status: DC | PRN

## 2017-10-27 MED ORDER — SERTRALINE HCL 25 MG PO TABS: 25.0000 mg | ORAL_TABLET | Freq: Every day | ORAL | 2 refills | Status: DC
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
## 2017-10-27 NOTE — Discharge Summary (Signed)
OB Discharge Summary     Patient Name: Donna Kim DOB: [birth year] MRN: [id number]  Date of admission: 10/25/2017 Delivering MD: Sherene Sires   Date of discharge: 10/27/2017  Admitting diagnosis: 37 wk induction Intrauterine pregnancy: [redacted]w[redacted]d     Secondary diagnosis:  Active Problems:   Cholestasis of pregnancy   SVD (spontaneous vaginal delivery)  Additional problems:  Patient Active Problem List   Diagnosis Date Noted  . SVD (spontaneous vaginal delivery) 10/27/2017  . Cholestasis of pregnancy 10/25/2017  . Cholestasis of pregnancy, antepartum 08/28/2017  . GBS bacteriuria 04/16/2017  . Supervision of normal pregnancy 04/14/2017  . Anxiety 04/14/2017  . Depression, postpartum, postpartum condition 12/17/2014  . Insomnia 12/17/2014  . ADHD (attention deficit hyperactivity disorder), inattentive type 10/02/2014  . Rosacea, acne 03/18/2014       Discharge diagnosis: Term Pregnancy Delivered                                                                                                Post partum procedures: None  Augmentation: Pitocin  Complications: None  Hospital course:  Induction of Labor With Vaginal Delivery   36 y.o. yo G3P3003 at [redacted]w[redacted]d was admitted to the hospital 10/25/2017 for induction of labor.  Indication for induction: Cholestasis of pregnancy.  Patient had an uncomplicated labor course as follows: Membrane Rupture Time/Date: 6:51 PM ,10/25/2017   Intrapartum Procedures: Episiotomy: None [1]                                         Lacerations:  None [1]  Patient had delivery of a Viable infant.  Information for the patient's newborn:  Takeysha, Bonk Girl Miguelina [[id number]]  Delivery Method: Vag-Spont   10/25/2017  Details of delivery can be found in separate delivery note.  Patient had a routine postpartum course. Patient is discharged home 10/27/17.  Physical exam  Vitals:   10/25/17 2345 10/26/17 0516 10/26/17 1739 10/27/17 0655  BP: 131/85 112/69  131/85 127/76  Pulse: 80 63 75 68  Resp: 18 18 20 18   Temp: 98 F (36.7 C) 98 F (36.7 C) 98.9 F (37.2 C) 98.1 F (36.7 C)  TempSrc: Oral Oral  Oral  SpO2:  95%    Weight:      Height:       General: alert, cooperative and no distress Lochia: appropriate Uterine Fundus: firm Incision: N/A DVT Evaluation: No evidence of DVT seen on physical exam. Labs: Lab Results  Component Value Date   WBC 9.2 10/27/2017   HGB 12.8 10/27/2017   HCT 39.5 10/27/2017   MCV 94.0 10/27/2017   PLT 101 (L) 10/27/2017   CMP Latest Ref Rng & Units 10/26/2017  Glucose 65 - 99 mg/dL 105(H)  BUN 6 - 20 mg/dL 9  Creatinine 0.44 - 1.00 mg/dL 0.79  Sodium 135 - 145 mmol/L 139  Potassium 3.5 - 5.1 mmol/L 4.0  Chloride 101 - 111 mmol/L 109  CO2 22 - 32 mmol/L 22  Calcium 8.9 - 10.3 mg/dL  9.2  Total Protein 6.5 - 8.1 g/dL 6.3(L)  Total Bilirubin 0.3 - 1.2 mg/dL 0.7  Alkaline Phos 38 - 126 U/L 106  AST 15 - 41 U/L 19  ALT 14 - 54 U/L 13(L)    Discharge instruction: per After Visit Summary and "Baby and Me Booklet".  After visit meds:  Allergies as of 10/27/2017      Reactions   Cefzil [cefprozil] Itching, Nausea And Vomiting      Medication List    STOP taking these medications   magnesium oxide 400 MG tablet Commonly known as:  MAG-OX   ursodiol 300 MG capsule Commonly known as:  ACTIGALL     TAKE these medications   ibuprofen 600 MG tablet Commonly known as:  ADVIL,MOTRIN Take 1 tablet (600 mg total) by mouth every 6 (six) hours.   omeprazole 20 MG capsule Commonly known as:  PRILOSEC Take 1 capsule (20 mg total) by mouth daily.   prenatal multivitamin Tabs tablet Take 1 tablet by mouth daily at 12 noon.   senna-docusate 8.6-50 MG tablet Commonly known as:  Senokot-S Take 2 tablets by mouth at bedtime as needed for mild constipation.       Diet: routine diet  Activity: Advance as tolerated. Pelvic rest for 6 weeks.   Outpatient follow up:1 week and 4 week  -Will  need BP check in a week and BH assessment for h/o pp depression Follow up Appt: Future Appointments  Date Time Provider [place]  12/04/2017 11:00 AM Roma Schanz, CNM [facility]   Follow up Visit:No Follow-up on file.  Postpartum contraception: Condoms  Newborn Data: Live born female  Birth Weight: 5 lb 10.8 oz (2574 g) APGAR: 9, 9  Newborn Delivery   Birth date/time:  10/25/2017 21:34:00 Delivery type:  Vaginal, Spontaneous     Baby Feeding: Bottle and Breast Disposition:home with mother   10/27/2017 Sherene Sires, DO

## 2017-10-27 NOTE — Lactation Note (Signed)
This note was copied from a baby's chart. Lactation Consultation Note  Patient Name: Donna Kim ONGEX'B Date: 10/27/2017 Reason for consult: Follow-up assessment  Baby 20 hours old. Mom reports that she has had some anxiety in the past while nursing her first 2 children, and she has been experiencing anxiety while nursing this baby. Mom states that she had a nice conversation with the social worker--Colleen--earlier. Mom states that she still wants the baby to get the benefits of the breast milk, but is not sure that she wants to keep putting baby to breast with every feeding. Caryl Pina, RN in the room and states that she will call Jaclyn Shaggy back to the room for mom to review her plans for breastfeeding and possible medication for anxiety after D/C. Enc mom to discuss with her provider as well. Mom states that her mother melted part of her personal DEBP while sterilizing it, and replacement parts have been ordered. In the meantime, mom would like to rent a pump. Referred mom to [employer] for [employer]. Discussed pumping and bottle-feeding EBM with mom as an alternative that mom could consider if she decides to stop putting baby to breast. Mom expressed that she had not thought of this, but pumping and bottle-feeding might accomplish her goals. Mom stated her appreciation for this LC's understanding and assistance. Mom reports that she intends to keep supplementing with formula for now--at least until her EBM volumes increase.   Mom aware of OP/BFSG and [place] phone line assistance after D/C.   Maternal Data    Feeding Feeding Type: Formula Nipple Type: Slow - flow Length of feed: 10 min  LATCH Score                   Interventions    Lactation Tools Discussed/Used     Consult Status Consult Status: PRN    Andres Labrum 10/27/2017, 11:29 AM

## 2017-10-27 NOTE — Discharge Instructions (Signed)

## 2017-10-27 NOTE — Progress Notes (Signed)
CSW received consult for hx of Anxiety and Depression.  CSW met with MOB and FOB to offer support and complete assessment.   MOB was extremely pleasant and welcoming of CSW's visit.  FOB was caring for infant on the couch and minimally involved in the conversation, but appears very supportive of MOB.  MOB commented that he was trying to get some work done and therefore, not engaged in our conversation.   MOB states she had significant PPD after her second baby and has given thought to planning this postpartum time period to hopefully avoid extreme emotions after this baby.  She reports that rather than waiting months for extended family to come assist, family is coming within the next week.  She made a point to say that she will allow them to help in order for her to ensure she is caring for herself and will not feel guilty if she is not equally distributing her time between all three children immediately.  She also states she has already made an appointment with her counselor in January, but states she can make one sooner if needed.  She has spoken with her MD about starting Zoloft and feels this is a good decision, but wanted to talk with CSW about medication, signs/symptoms of PMADs and treatment.   MOB was very receptive to education provided by CSW regarding the baby blues period vs. perinatal mood disorders.  CSW recommends self-evaluation during the postpartum time period using the New Mom Checklist from Postpartum Progress and the [place] Postnatal Depression Scale encouraged MOB to keep in contact with her MD and counselor as needed.  MOB agreed.     CSW identifies no further need for intervention and no barriers to discharge at this time.

## 2017-10-29 ENCOUNTER — Encounter: Payer: Self-pay | Admitting: Obstetrics & Gynecology

## 2017-10-30 ENCOUNTER — Encounter: Payer: Self-pay | Admitting: Women's Health

## 2017-10-30 ENCOUNTER — Ambulatory Visit (INDEPENDENT_AMBULATORY_CARE_PROVIDER_SITE_OTHER): Payer: BLUE CROSS/BLUE SHIELD | Admitting: Women's Health

## 2017-10-30 VITALS — BP 130/90 | HR 99 | Wt 213.0 lb

## 2017-10-30 DIAGNOSIS — Z0130 Encounter for examination of blood pressure without abnormal findings: Secondary | ICD-10-CM

## 2017-10-30 DIAGNOSIS — O165 Unspecified maternal hypertension, complicating the puerperium: Secondary | ICD-10-CM

## 2017-10-30 DIAGNOSIS — Z8679 Personal history of other diseases of the circulatory system: Secondary | ICD-10-CM | POA: Insufficient documentation

## 2017-10-30 DIAGNOSIS — Z8759 Personal history of other complications of pregnancy, childbirth and the puerperium: Secondary | ICD-10-CM

## 2017-10-30 DIAGNOSIS — F418 Other specified anxiety disorders: Secondary | ICD-10-CM

## 2017-10-30 MED ORDER — AMLODIPINE BESYLATE 5 MG PO TABS: 5.0000 mg | ORAL_TABLET | Freq: Every day | ORAL | 0 refills | Status: DC

## 2017-10-30 NOTE — Patient Instructions (Signed)
Stop the blood pressure medicine 2 days before your postpartum visit

## 2017-10-30 NOTE — Progress Notes (Signed)
   GYN VISIT Patient name: Donna Kim MRN [id number]  Date of birth: 04/07/1981 Chief Complaint:   Blood Pressure Check (pp depression)  History of Present Illness:   Donna Kim is a 36 y.o. G45P3003 Caucasian female 5d s/p SVB after IOL for ICP being seen today for bp check and depression screening. States she had a few elevated bp's in hospital. Denies ha, visual changes, ruq/epigastric pain, n/v.  Breast and bottlefeeding. Was started on zoloft 25mg  daily on d/c from hospital for dep/anx- feels much better today. Denies SI/HI/II. Thinking about POPs for birth control.      Patient's last menstrual period was 02/06/2017 (exact date). The current method of family planning is abstinence. Review of Systems:   Pertinent items are noted in HPI Denies fever/chills, dizziness, headaches, visual disturbances, fatigue, shortness of breath, chest pain, abdominal pain, vomiting, abnormal vaginal discharge/itching/odor/irritation, problems with periods, bowel movements, urination, or intercourse unless otherwise stated above.  Pertinent History Reviewed:  Reviewed past medical,surgical, social, obstetrical and family history.  Reviewed problem list, medications and allergies. Physical Assessment:   Vitals:   10/30/17 1533 10/30/17 1600  BP: 120/90 130/90  Pulse: 99   Weight: 213 lb (96.6 kg)   Body mass index is 35.45 kg/m.       Physical Examination:   General appearance: alert, well appearing, and in no distress  Mental status: alert, oriented to person, place, and time  Skin: warm & dry   Cardiovascular: normal heart rate noted  Respiratory: normal respiratory effort, no distress  Abdomen: soft, non-tender   Pelvic: examination not indicated  Extremities: no edema   No results found for this or any previous visit (from the past 24 hour(s)).  Assessment & Plan:  1) 5d s/p SVB after IOL for ICP  2) Dep/anx> continue zoloft 25mg  daily, understands can take a few weeks to notice  improvement  3) PP HTN> rx norvasc 5mg  daily, stop 2d prior to pp visit  No orders of the defined types were placed in this encounter.   Return for As scheduled 1/3 for pp visit.  Tawnya Crook CNM, [facility] 10/30/2017 4:04 PM

## 2017-11-04 DIAGNOSIS — M546 Pain in thoracic spine: Secondary | ICD-10-CM | POA: Diagnosis not present

## 2017-11-04 DIAGNOSIS — M9901 Segmental and somatic dysfunction of cervical region: Secondary | ICD-10-CM | POA: Diagnosis not present

## 2017-11-04 DIAGNOSIS — M542 Cervicalgia: Secondary | ICD-10-CM | POA: Diagnosis not present

## 2017-11-04 DIAGNOSIS — M9902 Segmental and somatic dysfunction of thoracic region: Secondary | ICD-10-CM | POA: Diagnosis not present

## 2017-12-04 ENCOUNTER — Ambulatory Visit: Payer: BLUE CROSS/BLUE SHIELD | Admitting: Adult Health

## 2017-12-04 ENCOUNTER — Ambulatory Visit: Payer: BLUE CROSS/BLUE SHIELD | Admitting: Women's Health

## 2017-12-12 ENCOUNTER — Encounter: Payer: Self-pay | Admitting: Women's Health

## 2017-12-12 ENCOUNTER — Other Ambulatory Visit: Payer: Self-pay

## 2017-12-12 ENCOUNTER — Ambulatory Visit (INDEPENDENT_AMBULATORY_CARE_PROVIDER_SITE_OTHER): Payer: 59 | Admitting: Women's Health

## 2017-12-12 DIAGNOSIS — F418 Other specified anxiety disorders: Secondary | ICD-10-CM

## 2017-12-12 MED ORDER — SERTRALINE HCL 50 MG PO TABS: 50.0000 mg | ORAL_TABLET | Freq: Every day | ORAL | 11 refills | Status: DC

## 2017-12-12 NOTE — Progress Notes (Signed)
   POSTPARTUM VISIT Patient name: Donna Kim MRN [id number]  Date of birth: Jan 01, 1981 Chief Complaint:   Postpartum Care  History of Present Illness:   Donna Kim is a 37 y.o. G37P3003 Caucasian female being seen today for a postpartum visit. She is 6 weeks postpartum following a spontaneous vaginal delivery at 37.2 gestational weeks after IOL for ICP. Anesthesia: epidural. I have fully reviewed the prenatal and intrapartum course. Pregnancy complicated by cholestasis of pregnancy, dep/anx. Postpartum course has been complicated by PP HTN- rx'd norvasc 5mg  11/29 stopped taking 12/02/17.  Bleeding thin lochia. Bowel function is normal. Bladder function is normal.  Patient is sexually active.  Contraception method is condoms.  [place] Postpartum Depression Screening: negative. Score 4. Requests increase in dosage to 50mg  daily, can tell anxiety peaks when getting close to next dose Last pap 04/14/17.  Results were normal .  Patient's last menstrual period was 12/12/2017.  Baby's course has been uncomplicated. Baby is feeding by breast.  Review of Systems:   Pertinent items are noted in HPI Denies Abnormal vaginal discharge w/ itching/odor/irritation, headaches, visual changes, shortness of breath, chest pain, abdominal pain, severe nausea/vomiting, or problems with urination or bowel movements. Pertinent History Reviewed:  Reviewed past medical,surgical, obstetrical and family history.  Reviewed problem list, medications and allergies. OB History  Gravida Para Term Preterm AB Living  3 3 3     3   SAB TAB Ectopic Multiple Live Births        0 3    # Outcome Date GA Lbr Len/2nd Weight Sex Delivery Anes PTL Lv  3 Term 10/25/17 [redacted]w[redacted]d 01:44 / 00:06 5 lb 10.8 oz (2.574 kg) F Vag-Spont EPI  LIV  2 Term 07/04/14 [redacted]w[redacted]d 01:59 / 00:08 8 lb 15.4 oz (4.065 kg) F Vag-Spont None N LIV  1 Term 01/02/12 [redacted]w[redacted]d 10:50 / 05:14 7 lb 2.6 oz (3.249 kg) F Vag-Spont EPI N LIV     Birth Comments: none      Physical Assessment:   Vitals:   12/12/17 1232  BP: 110/70  Pulse: 80  Weight: 206 lb (93.4 kg)  Height: 5\' 5"  (1.651 m)  Body mass index is 34.28 kg/m.       Physical Examination:   General appearance: alert, well appearing, and in no distress  Mental status: alert, oriented to person, place, and time  Skin: warm & dry   Cardiovascular: normal heart rate noted   Respiratory: normal respiratory effort, no distress   Breasts: deferred, no complaints   Abdomen: soft, non-tender   Pelvic: VULVA: normal appearing vulva with no masses, tenderness or lesions, UTERUS: uterus is normal size, shape, consistency and nontender  Rectal: No hemorrhoids  Extremities: No edema       No results found for this or any previous visit (from the past 24 hour(s)).  Assessment & Plan:  1) Postpartum exam 2) 6 wks s/p SVB after IOL for ICP 3) Breastfeeding 4) Depression screening 5) Contraception counseling, pt prefers condoms  6) Resolved pp HTN 7) Dep/anx>increase zoloft to 50mg  daily, rx sent  Follow-up: Return for after 5/14 for , Physical.   No orders of the defined types were placed in this encounter.   Tawnya Crook CNM, [facility] 12/12/2017 1:05 PM

## 2017-12-29 ENCOUNTER — Encounter: Payer: Self-pay | Admitting: Women's Health

## 2017-12-29 ENCOUNTER — Telehealth: Payer: Self-pay | Admitting: *Deleted

## 2017-12-29 NOTE — Telephone Encounter (Signed)
Patient called stating she has noticed a rash from her neck to chest and on face since increasing Zoloft dosage. The rash is itchy and burns and has gotten worse. She has decreased back down to 25mg  so not to stop abruptly and is taking benadryl which is helping. Concerned she may need a different medication. Please advise.

## 2018-01-07 ENCOUNTER — Encounter: Payer: Self-pay | Admitting: Women's Health

## 2018-01-18 ENCOUNTER — Encounter: Payer: Self-pay | Admitting: Women's Health

## 2018-03-18 ENCOUNTER — Encounter: Payer: Self-pay | Admitting: Women's Health

## 2018-03-19 ENCOUNTER — Encounter: Payer: Self-pay | Admitting: Family Medicine

## 2018-03-19 ENCOUNTER — Ambulatory Visit: Payer: BLUE CROSS/BLUE SHIELD | Admitting: Family Medicine

## 2018-03-19 VITALS — BP 118/90 | Temp 98.1°F | Ht 65.0 in | Wt 214.1 lb

## 2018-03-19 DIAGNOSIS — J329 Chronic sinusitis, unspecified: Secondary | ICD-10-CM

## 2018-03-19 DIAGNOSIS — R21 Rash and other nonspecific skin eruption: Secondary | ICD-10-CM | POA: Diagnosis not present

## 2018-03-19 DIAGNOSIS — J310 Chronic rhinitis: Secondary | ICD-10-CM

## 2018-03-19 MED ORDER — TRIAMCINOLONE ACETONIDE 0.1 % EX CREA
1.0000 | TOPICAL_CREAM | Freq: Two times a day (BID) | CUTANEOUS | 2 refills | Status: DC
Start: 2018-03-19 — End: 2019-02-04

## 2018-03-19 MED ORDER — AMOXICILLIN 500 MG PO CAPS: 500.0000 mg | ORAL_CAPSULE | Freq: Three times a day (TID) | ORAL | 0 refills | Status: DC

## 2018-03-19 NOTE — Progress Notes (Signed)
   Subjective:    Patient ID: Donna Kim, female    DOB: 11-13-1981, 37 y.o.   MRN: [id number]  HPI  Patient is here today with complaints of a cough,wheezing,runny nose,left ear pain,sore throat since last weekend. Not taking much as she breast feeds.Did take robitussin last night.  Pt had  holestasis with pregnancy after last child was born, at thiat time had itching. Has developed  A rash upper torso and itchy. Kind of reminds her of the rash with the cholestasis w so swonders if related   Pt has greenish phlegm also   Persistent itchy upper torso rash.      Review of Systems No headache, no major weight loss or weight gain, no chest pain no back pain abdominal pain no change in bowel habits complete ROS otherwise negative     Objective:   Physical Exam  Alert, mild malaise. Hydration good Vitals stable. frontal/ maxillary tenderness evident positive nasal congestion. pharynx normal neck supple  lungs clear/no crackles or wheezes. heart regular in rhythm  Skin patchy eczema upper torso     Assessment & Plan:  Impression rhinosinusitis/bronchitis likely post viral, discussed with patient. plan antibiotics prescribed. Questions answered. Symptomatic care discussed. warning signs discussed. WSL Also add triamcinolone twice daily to eczema rash discussed

## 2018-04-16 ENCOUNTER — Ambulatory Visit: Payer: BLUE CROSS/BLUE SHIELD | Admitting: Women's Health

## 2018-04-16 ENCOUNTER — Encounter: Payer: Self-pay | Admitting: Women's Health

## 2018-04-16 VITALS — BP 122/62 | HR 96 | Ht 65.0 in | Wt 210.4 lb

## 2018-04-16 DIAGNOSIS — M6208 Separation of muscle (nontraumatic), other site: Secondary | ICD-10-CM | POA: Diagnosis not present

## 2018-04-16 DIAGNOSIS — N926 Irregular menstruation, unspecified: Secondary | ICD-10-CM | POA: Diagnosis not present

## 2018-04-16 MED ORDER — NORETHINDRONE 0.35 MG PO TABS: 1.0000 | ORAL_TABLET | Freq: Every day | ORAL | 11 refills | Status: DC

## 2018-04-16 NOTE — Progress Notes (Signed)
   GYN VISIT Patient name: Donna Kim MRN [id number]  Date of birth: Dec 17, 1980 Chief Complaint:   period every 2 weeks (abdominal muscle separation)  History of Present Illness:   Donna Kim is a 37 y.o. G33P3003 Caucasian female being seen today for report of periods twice monthly since baby born almost 49mths ago. Denies abnormal discharge, itching/odor/irritation.  Not on any birth control. Using condoms. Breastfeeding. Also reports she has diastasis recti.      Patient's last menstrual period was 04/07/2018. The current method of family planning is condoms  Last pap 04/14/17. Results were:  neg w/ -HRHPV Review of Systems:   Pertinent items are noted in HPI Denies fever/chills, dizziness, headaches, visual disturbances, fatigue, shortness of breath, chest pain, abdominal pain, vomiting, abnormal vaginal discharge/itching/odor/irritation, problems with periods, bowel movements, urination, or intercourse unless otherwise stated above.  Pertinent History Reviewed:  Reviewed past medical,surgical, social, obstetrical and family history.  Reviewed problem list, medications and allergies. Physical Assessment:   Vitals:   04/16/18 1217  Pulse: 96  Weight: 210 lb 6.4 oz (95.4 kg)  Height: 5\' 5"  (1.651 m)  Body mass index is 35.01 kg/m.       Physical Examination:   General appearance: alert, well appearing, and in no distress  Mental status: alert, oriented to person, place, and time  Skin: warm & dry   Cardiovascular: normal heart rate noted  Respiratory: normal respiratory effort, no distress  Abdomen: soft, non-tender, small diastasis recti   Pelvic: VULVA: normal appearing vulva with no masses, tenderness or lesions, VAGINA: normal appearing vagina with normal color and nonodorous discharge, no lesions, CERVIX: normal appearing cervix without discharge or lesions  Extremities: no edema   No results found for this or any previous visit (from the past 24 hour(s)).    Assessment & Plan:  1) Frequent periods> discussed can try birth control, pt is unsure, hasn't done well w/ it in past, wants rx for micronor and will think about it  2) Small diastasis recti> discussed exercise  Meds:  Meds ordered this encounter  Medications  . norethindrone (MICRONOR,CAMILA,ERRIN) 0.35 MG tablet    Sig: Take 1 tablet (0.35 mg total) by mouth daily.    Dispense:  1 Package    Refill:  11    Order Specific Question:   Supervising Provider    Answer:   Elonda Husky, LUTHER H [[id number]]    No orders of the defined types were placed in this encounter.   Return in about 1 year (around 04/17/2019) for Physical.  Roma Schanz CNM, WHNP-BC 04/16/2018 1:10 PM

## 2018-06-10 ENCOUNTER — Encounter (HOSPITAL_COMMUNITY): Payer: Self-pay | Admitting: Psychiatry

## 2018-06-10 ENCOUNTER — Ambulatory Visit (HOSPITAL_COMMUNITY): Payer: BLUE CROSS/BLUE SHIELD | Admitting: Psychiatry

## 2018-06-10 DIAGNOSIS — F909 Attention-deficit hyperactivity disorder, unspecified type: Secondary | ICD-10-CM | POA: Diagnosis not present
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
## 2018-06-24 ENCOUNTER — Ambulatory Visit (HOSPITAL_COMMUNITY): Payer: BLUE CROSS/BLUE SHIELD | Admitting: Psychiatry

## 2018-07-27 ENCOUNTER — Ambulatory Visit (HOSPITAL_COMMUNITY): Payer: BLUE CROSS/BLUE SHIELD | Admitting: Psychiatry

## 2018-08-13 ENCOUNTER — Ambulatory Visit (HOSPITAL_COMMUNITY): Payer: Self-pay | Admitting: Psychiatry

## 2018-09-03 ENCOUNTER — Ambulatory Visit (HOSPITAL_COMMUNITY): Payer: BLUE CROSS/BLUE SHIELD | Admitting: Psychiatry

## 2018-09-03 DIAGNOSIS — F909 Attention-deficit hyperactivity disorder, unspecified type: Secondary | ICD-10-CM

## 2018-09-03 NOTE — Progress Notes (Signed)
   THERAPIST PROGRESS NOTE  Session Time: Thursday 09/03/2018 11:15 AM -  12:00 PM   Participation Level: Active  Behavioral Response: CasualAlertEuthymic/talkative,  Type of Therapy: Individual Therapy  Treatment Goals addressed: minimize ADHD behavioral interference in daily life  Interventions: CBT  Summary: Donna Kim is a 37 y.o. female who presents with a history of symptoms of anxiety and depression along with a previous diagnosis of ADHD.   Patient reports no psychiatric hospitalizations. She has received outpatient psychotherapy and medication management at this practice. She was last seen in October 2018. Patient denies being depressed or overly anxious. She reports resuming services due to  mainly having difficulty managing her anger and frustration. She had a 3rd child 7 months ago and reports stress related to parenting her two oldest children who have behavioral issues. She also reports ongoing communication issues with husband as she reports misinterpreting what he says, interrupting,  and having difficulty slowing down before responding. She reports irritability, becoming upset easily, and yelling.   Patient last was seen in July 2019. She reports continued frustration, irritability, and interrupting conversations. She reports continued communication issues in her marriage as she reports interrupting frequently and difficulty expressing her thoughts and feelings in conversation with husband. She reports becoming frustrated easily with children and yelling. She also reports difficulty setting limits with children. She also expresses frustration with managing time and her physical space. She reports difficulty organizing physical space, developing and maintaining structure/schedule, and completing tasks. Patient has expressed reluctance regarding medication for ADHD in the past but expresses increased receptivity to this at this time.   Suicidal/Homicidal: Nowithout  intent/plan  Therapist Response: Discussed stressors, facilitated expression of thoughts and feelings, discussed patient's history of ADHD and assisted patient identify effects on current functioning, discussed role of medication in treating ADHD, assisted patient identify/challenge/ and replace unhelpful thoughts with helpful thoughts regarding medication, discussed referral to psychiatrist Dr. Modesta Messing for medication evaluation/ management, assisted patient to began to develop treatment targets ( improve relationship with husband: avoid interrupting conversations, learn and implement assertiveness skills; improve interaction with children: learn to speak to children in firm voice rather than yelling, learn and implement skills to set/maintain limits with children; improve household management, learn and implement organizational skills to organize physical space, develop and implement structured daily schedule), provided patient with handout on tips for managing Adult ADHD and assigned her to review.   Plan: Return again in 2 weeks. Patient agrees to schedule medication evaluation with Dr. Modesta Messing,   Diagnosis: Axis I: ADHD    Axis II: Deferred    Vipul Cafarelli, LCSW 09/03/2018

## 2018-09-17 ENCOUNTER — Ambulatory Visit (INDEPENDENT_AMBULATORY_CARE_PROVIDER_SITE_OTHER): Payer: BLUE CROSS/BLUE SHIELD | Admitting: Psychiatry

## 2018-09-17 DIAGNOSIS — F909 Attention-deficit hyperactivity disorder, unspecified type: Secondary | ICD-10-CM

## 2018-09-17 NOTE — Progress Notes (Signed)
   THERAPIST PROGRESS NOTE  Session Time: Thursday 09/17/2018 11:15 AM -  12:00 PM   Participation Level: Active  Behavioral Response: CasualAlertEuthymic/talkative,distractible  Type of Therapy: Individual Therapy  Treatment Goals addressed: minimize ADHD behavioral interference in daily life  Interventions: CBT  Summary: Donna Kim is a 37 y.o. female who presents with a history of symptoms of anxiety and depression along with a previous diagnosis of ADHD.   Patient reports no psychiatric hospitalizations. She has received outpatient psychotherapy and medication management at this practice. She was last seen in October 2018. Patient denies being depressed or overly anxious. She reports resuming services due to  mainly having difficulty managing her anger and frustration. She had a 3rd child 7 months ago and reports stress related to parenting her two oldest children who have behavioral issues. She also reports ongoing communication issues with husband as she reports misinterpreting what he says, interrupting,  and having difficulty slowing down before responding. She reports irritability, becoming upset easily, and yelling.   Patient last was seen 2 weeks ago. She reports trying to pause and improve her efforts in avoiding interrupting conversations with husband. She continues to have difficulty managing time and organizing her physical space, developing and maintaining structure/schedule, and completing tasks.  She continues to become easily frustrated with children and yelling. She reports difficulty setting limits with children. She is receptive to medication and is  scheduled to see Dr. Modesta Messing for medication evaluation  Suicidal/Homicidal: Nowithout intent/plan  Therapist Response: Discussed stressors,  facilitated expression of thoughts and feelings, assisted patient develop priorities in her goals, discussed focusing on developing and implementing daily schedule, assisted patient  develop schedule for the evening routine with her children and bath/bed time, assigned patient to implement schedule  Plan: Return again in 2 weeks  Diagnosis: Axis I: ADHD    Axis II: Deferred    Donna Rostad, LCSW 09/17/2018

## 2018-10-01 ENCOUNTER — Ambulatory Visit (HOSPITAL_COMMUNITY): Payer: Self-pay | Admitting: Psychiatry

## 2018-10-01 ENCOUNTER — Ambulatory Visit (HOSPITAL_COMMUNITY): Payer: BLUE CROSS/BLUE SHIELD | Admitting: Psychiatry

## 2018-10-08 ENCOUNTER — Ambulatory Visit (HOSPITAL_COMMUNITY): Payer: BLUE CROSS/BLUE SHIELD | Admitting: Psychiatry

## 2018-10-08 ENCOUNTER — Ambulatory Visit (INDEPENDENT_AMBULATORY_CARE_PROVIDER_SITE_OTHER): Payer: BLUE CROSS/BLUE SHIELD | Admitting: Psychiatry

## 2018-10-08 DIAGNOSIS — F909 Attention-deficit hyperactivity disorder, unspecified type: Secondary | ICD-10-CM

## 2018-10-08 NOTE — Progress Notes (Signed)
   THERAPIST PROGRESS NOTE  Session Time: Thursday 10/08/2018 2:12 PM - 3:00 PM  Participation Level: Active  Behavioral Response: CasualAlertEuthymic/talkative,distractible  Type of Therapy: Individual Therapy  Treatment Goals addressed: minimize ADHD behavioral interference in daily life  Interventions: CBT  Summary: Donna Kim is a 37 y.o. female who presents with a history of symptoms of anxiety and depression along with a previous diagnosis of ADHD.   Patient reports no psychiatric hospitalizations. She has received outpatient psychotherapy and medication management at this practice. She was last seen in October 2018. Patient denies being depressed or overly anxious. She reports resuming services due to  mainly having difficulty managing her anger and frustration. She had a 3rd child 7 months ago and reports stress related to parenting her two oldest children who have behavioral issues. She also reports ongoing communication issues with husband as she reports misinterpreting what he says, interrupting,  and having difficulty slowing down before responding. She reports irritability, becoming upset easily, and yelling.   Patient last was seen 2 -3 weeks ago. She reports trying to implement schedule for evening routine with her children developed in last session but says she had difficulty adhering to the time allotted. She states realizing she actually needs more time to perform some of the activities on the schedule. She also reports continued difficulty in preparing and managing the family's evening meal. She remains receptive to medication and is  scheduled to see Dr. Modesta Messing for medication evaluation  Suicidal/Homicidal: Nowithout intent/plan  Therapist Response: Discussed stressors,  facilitated expression of thoughts and feelings, assisted patient identify processes that  inhibited implementation of schedule, assisted patient adjust schedule for evening routine, assigned patient to  implement schedule, assisted patient develop steps, tips, schedule  for preparing evening meal, assisted patient identify realistic expectations  in preparing evening meal, assigned patient to implement strategies discussed in session  Plan: Return again in 3-4  weeks  Diagnosis: Axis I: ADHD    Axis II: Deferred    Donna Homewood, LCSW 10/08/2018

## 2018-10-15 ENCOUNTER — Ambulatory Visit (HOSPITAL_COMMUNITY): Payer: Self-pay | Admitting: Psychiatry

## 2018-10-28 NOTE — Progress Notes (Deleted)
Psychiatric Initial Adult Assessment   Patient Identification: Donna Kim MRN:  [id number] Date of Evaluation:  10/28/2018 Referral Source: *** Chief Complaint:   Visit Diagnosis: No diagnosis found.  History of Present Illness:   Donna Kim is a 37 y.o. year old female with a history of ADHD, postpartum depression, who is referred for    vyvanse 50 mg  Associated Signs/Symptoms: Depression Symptoms:  {DEPRESSION SYMPTOMS:20000} (Hypo) Manic Symptoms:  {BHH MANIC SYMPTOMS:22872} Anxiety Symptoms:  {BHH ANXIETY SYMPTOMS:22873} Psychotic Symptoms:  {BHH PSYCHOTIC SYMPTOMS:22874} PTSD Symptoms: {BHH PTSD UDJSHFWY:63785}  Past Psychiatric History:  Outpatient: Seen by Dr. Harrington Challenger in 2018 for ADHD Psychiatry admission:  Previous suicide attempt:  Past trials of medication:  History of violence:   Previous Psychotropic Medications: {YES/NO:21197}  Substance Abuse History in the last 12 months:  {yes no:314532}  Consequences of Substance Abuse: {BHH CONSEQUENCES OF SUBSTANCE ABUSE:22880}  Past Medical History:  Past Medical History:  Diagnosis Date  . ADHD   . Back pain 11/12/2013  . Cholestasis 08/2017  . Contraceptive management 08/12/2014  . Mental disorder    anxiety  . No pertinent past medical history   . Postpartum bleeding 07/15/2014  . Rosacea   . UTI (urinary tract infection)   . Vaginal Pap smear, abnormal     Past Surgical History:  Procedure Laterality Date  . WISDOM TOOTH EXTRACTION      Family Psychiatric History: ***  Family History:  Family History  Adopted: Yes  Problem Relation Age of Onset  . ADD / ADHD Daughter     Social History:   Social History   Socioeconomic History  . Marital status: Married    Spouse name: Not on file  . Number of children: Not on file  . Years of education: Not on file  . Highest education level: Not on file  Occupational History  . Not on file  Social Needs  . Financial resource strain: Not on  file  . Food insecurity:    Worry: Not on file    Inability: Not on file  . Transportation needs:    Medical: Not on file    Non-medical: Not on file  Tobacco Use  . Smoking status: Never Smoker  . Smokeless tobacco: Never Used  Substance and Sexual Activity  . Alcohol use: No    Comment: occasionally wine  . Drug use: No  . Sexual activity: Yes    Birth control/protection: None  Lifestyle  . Physical activity:    Days per week: Not on file    Minutes per session: Not on file  . Stress: Not on file  Relationships  . Social connections:    Talks on phone: Not on file    Gets together: Not on file    Attends religious service: Not on file    Active member of club or organization: Not on file    Attends meetings of clubs or organizations: Not on file    Relationship status: Not on file  Other Topics Concern  . Not on file  Social History Narrative  . Not on file    Additional Social History: ***  Allergies:   Allergies  Allergen Reactions  . Cefzil [Cefprozil] Itching and Nausea And Vomiting    Metabolic Disorder Labs: Lab Results  Component Value Date   HGBA1C 5.4 06/14/2015   Lab Results  Component Value Date   PROLACTIN 14.1 09/16/2014   No results found for: CHOL, TRIG, HDL, CHOLHDL, VLDL, LDLCALC Lab Results  Component Value Date   TSH 0.716 06/14/2015    Therapeutic Level Labs: No results found for: LITHIUM No results found for: CBMZ No results found for: VALPROATE  Current Medications: Current Outpatient Medications  Medication Sig Dispense Refill  . amLODipine (NORVASC) 5 MG tablet Take 1 tablet (5 mg total) by mouth daily. (Patient not taking: Reported on 12/12/2017) 30 tablet 0  . amoxicillin (AMOXIL) 500 MG capsule Take 1 capsule (500 mg total) by mouth 3 (three) times daily. (Patient not taking: Reported on 10/08/2018) 30 capsule 0  . lactobacillus acidophilus (BACID) TABS tablet Take 2 tablets by mouth 3 (three) times daily.    . magnesium  gluconate (MAGONATE) 500 MG tablet Take 500 mg by mouth 2 (two) times daily.    . norethindrone (MICRONOR,CAMILA,ERRIN) 0.35 MG tablet Take 1 tablet (0.35 mg total) by mouth daily. (Patient not taking: Reported on 06/10/2018) 1 Package 11  . Prenatal Vit-Fe Fumarate-FA (PRENATAL MULTIVITAMIN) TABS tablet Take 1 tablet by mouth daily at 12 noon.    . sertraline (ZOLOFT) 50 MG tablet Take 1 tablet (50 mg total) by mouth daily. (Patient not taking: Reported on 04/16/2018) 30 tablet 11  . triamcinolone cream (KENALOG) 0.1 % Apply 1 application topically 2 (two) times daily. (Patient not taking: Reported on 10/08/2018) 30 g 2   No current facility-administered medications for this visit.     Musculoskeletal: Strength & Muscle Tone: within normal limits Gait & Station: normal Patient leans: N/A  Psychiatric Specialty Exam: ROS  currently breastfeeding.There is no height or weight on file to calculate BMI.  General Appearance: Fairly Groomed  Eye Contact:  Good  Speech:  Clear and Coherent  Volume:  Normal  Mood:  {BHH MOOD:22306}  Affect:  {Affect (PAA):22687}  Thought Process:  Coherent  Orientation:  Full (Time, Place, and Person)  Thought Content:  Logical  Suicidal Thoughts:  {ST/HT (PAA):22692}  Homicidal Thoughts:  {ST/HT (PAA):22692}  Memory:  Immediate;   Good  Judgement:  {Judgement (PAA):22694}  Insight:  {Insight (PAA):22695}  Psychomotor Activity:  Normal  Concentration:  Concentration: Good and Attention Span: Good  Recall:  Good  Fund of Knowledge:Good  Language: Good  Akathisia:  No  Handed:  Right  AIMS (if indicated):  not done  Assets:  Communication Skills Desire for Improvement  ADL's:  Intact  Cognition: WNL  Sleep:  {BHH GOOD/FAIR/POOR:22877}   Screenings: GAD-7     Counselor from 02/25/2017 in [facility]  Total GAD-7 Score  10    PHQ2-9     Initial Prenatal from 04/14/2017 in [facility] Counselor from  02/25/2017 in [facility]  PHQ-2 Total Score  0  2  PHQ-9 Total Score  5  7      Assessment and Plan:    Plan  The patient demonstrates the following risk factors for suicide: Chronic risk factors for suicide include: {Chronic Risk Factors for ZOXWRUE:45409811}. Acute risk factors for suicide include: {Acute Risk Factors for BJYNWGN:56213086}. Protective factors for this patient include: {Protective Factors for Suicide VHQI:69629528}. Considering these factors, the overall suicide risk at this point appears to be {Desc; low/moderate/high:110033}. Patient {ACTION; IS/IS UXL:24401027} appropriate for outpatient follow up.     Norman Clay, MD 11/27/20191:19 PM

## 2018-11-02 ENCOUNTER — Ambulatory Visit (HOSPITAL_COMMUNITY): Payer: BLUE CROSS/BLUE SHIELD | Admitting: Psychiatry

## 2018-11-05 ENCOUNTER — Ambulatory Visit (HOSPITAL_COMMUNITY): Payer: Self-pay | Admitting: Psychiatry
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
## 2018-12-10 ENCOUNTER — Ambulatory Visit (HOSPITAL_COMMUNITY): Payer: BLUE CROSS/BLUE SHIELD | Admitting: Psychiatry

## 2018-12-22 NOTE — Progress Notes (Deleted)
Psychiatric Initial Adult Assessment   Patient Identification: Donna Kim MRN:  [id number] Date of Evaluation:  12/22/2018 Referral Source: Maurice Small Chief Complaint:   Visit Diagnosis: No diagnosis found.  History of Present Illness:   Donna Kim is a 38 y.o. year old female with a history of anxiety, depression, ADHD by history, who is referred for     Associated Signs/Symptoms: Depression Symptoms:  {DEPRESSION SYMPTOMS:20000} (Hypo) Manic Symptoms:  {BHH MANIC SYMPTOMS:22872} Anxiety Symptoms:  {BHH ANXIETY SYMPTOMS:22873} Psychotic Symptoms:  {BHH PSYCHOTIC SYMPTOMS:22874} PTSD Symptoms: {BHH PTSD SYMPTOMS:22875}  Past Psychiatric History:  Outpatient:  Psychiatry admission:  Previous suicide attempt:  Past trials of medication:  History of violence:   Previous Psychotropic Medications: {YES/NO:21197}  Substance Abuse History in the last 12 months:  {yes no:314532}  Consequences of Substance Abuse: {BHH CONSEQUENCES OF SUBSTANCE ABUSE:22880}  Past Medical History:  Past Medical History:  Diagnosis Date  . ADHD   . Back pain 11/12/2013  . Cholestasis 08/2017  . Contraceptive management 08/12/2014  . Mental disorder    anxiety  . No pertinent past medical history   . Postpartum bleeding 07/15/2014  . Rosacea   . UTI (urinary tract infection)   . Vaginal Pap smear, abnormal     Past Surgical History:  Procedure Laterality Date  . WISDOM TOOTH EXTRACTION      Family Psychiatric History: ***  Family History:  Family History  Adopted: Yes  Problem Relation Age of Onset  . ADD / ADHD Daughter     Social History:   Social History   Socioeconomic History  . Marital status: Married    Spouse name: Not on file  . Number of children: Not on file  . Years of education: Not on file  . Highest education level: Not on file  Occupational History  . Not on file  Social Needs  . Financial resource strain: Not on file  . Food insecurity:   Worry: Not on file    Inability: Not on file  . Transportation needs:    Medical: Not on file    Non-medical: Not on file  Tobacco Use  . Smoking status: Never Smoker  . Smokeless tobacco: Never Used  Substance and Sexual Activity  . Alcohol use: No    Comment: occasionally wine  . Drug use: No  . Sexual activity: Yes    Birth control/protection: None  Lifestyle  . Physical activity:    Days per week: Not on file    Minutes per session: Not on file  . Stress: Not on file  Relationships  . Social connections:    Talks on phone: Not on file    Gets together: Not on file    Attends religious service: Not on file    Active member of club or organization: Not on file    Attends meetings of clubs or organizations: Not on file    Relationship status: Not on file  Other Topics Concern  . Not on file  Social History Narrative  . Not on file    Additional Social History: ***  Allergies:   Allergies  Allergen Reactions  . Cefzil [Cefprozil] Itching and Nausea And Vomiting    Metabolic Disorder Labs: Lab Results  Component Value Date   HGBA1C 5.4 06/14/2015   Lab Results  Component Value Date   PROLACTIN 14.1 09/16/2014   No results found for: CHOL, TRIG, HDL, CHOLHDL, VLDL, LDLCALC Lab Results  Component Value Date   TSH 0.716 06/14/2015  Therapeutic Level Labs: No results found for: LITHIUM No results found for: CBMZ No results found for: VALPROATE  Current Medications: Current Outpatient Medications  Medication Sig Dispense Refill  . amLODipine (NORVASC) 5 MG tablet Take 1 tablet (5 mg total) by mouth daily. (Patient not taking: Reported on 12/12/2017) 30 tablet 0  . amoxicillin (AMOXIL) 500 MG capsule Take 1 capsule (500 mg total) by mouth 3 (three) times daily. (Patient not taking: Reported on 10/08/2018) 30 capsule 0  . lactobacillus acidophilus (BACID) TABS tablet Take 2 tablets by mouth 3 (three) times daily.    . magnesium gluconate (MAGONATE) 500 MG  tablet Take 500 mg by mouth 2 (two) times daily.    . norethindrone (MICRONOR,CAMILA,ERRIN) 0.35 MG tablet Take 1 tablet (0.35 mg total) by mouth daily. (Patient not taking: Reported on 06/10/2018) 1 Package 11  . Prenatal Vit-Fe Fumarate-FA (PRENATAL MULTIVITAMIN) TABS tablet Take 1 tablet by mouth daily at 12 noon.    . sertraline (ZOLOFT) 50 MG tablet Take 1 tablet (50 mg total) by mouth daily. (Patient not taking: Reported on 04/16/2018) 30 tablet 11  . triamcinolone cream (KENALOG) 0.1 % Apply 1 application topically 2 (two) times daily. (Patient not taking: Reported on 10/08/2018) 30 g 2   No current facility-administered medications for this visit.     Musculoskeletal: Strength & Muscle Tone: within normal limits Gait & Station: normal Patient leans: N/A  Psychiatric Specialty Exam: ROS  currently breastfeeding.There is no height or weight on file to calculate BMI.  General Appearance: Fairly Groomed  Eye Contact:  Good  Speech:  Clear and Coherent  Volume:  Normal  Mood:  {BHH MOOD:22306}  Affect:  {Affect (PAA):22687}  Thought Process:  Coherent  Orientation:  Full (Time, Place, and Person)  Thought Content:  Logical  Suicidal Thoughts:  {ST/HT (PAA):22692}  Homicidal Thoughts:  {ST/HT (PAA):22692}  Memory:  Immediate;   Good  Judgement:  {Judgement (PAA):22694}  Insight:  {Insight (PAA):22695}  Psychomotor Activity:  Normal  Concentration:  Concentration: Good and Attention Span: Good  Recall:  Good  Fund of Knowledge:Good  Language: Good  Akathisia:  No  Handed:  Right  AIMS (if indicated):  not done  Assets:  Communication Skills Desire for Improvement  ADL's:  Intact  Cognition: WNL  Sleep:  {BHH GOOD/FAIR/POOR:22877}   Screenings: GAD-7     Counselor from 02/25/2017 in [facility]  Total GAD-7 Score  10    PHQ2-9     Initial Prenatal from 04/14/2017 in [facility] Counselor from 02/25/2017 in [facility]  PHQ-2 Total Score  0  2  PHQ-9 Total Score  5  7      Assessment and Plan:  Assessment  Plan  The patient demonstrates the following risk factors for suicide: Chronic risk factors for suicide include: {Chronic Risk Factors for FGBMSXJ:15520802}. Acute risk factors for suicide include: {Acute Risk Factors for MVVKPQA:44975300}. Protective factors for this patient include: {Protective Factors for Suicide FRTM:21117356}. Considering these factors, the overall suicide risk at this point appears to be {Desc; low/moderate/high:110033}. Patient {ACTION; IS/IS POL:41030131} appropriate for outpatient follow up.    Norman Clay, MD 1/21/20209:41 AM

## 2018-12-23 ENCOUNTER — Ambulatory Visit (HOSPITAL_COMMUNITY): Payer: BLUE CROSS/BLUE SHIELD | Admitting: Psychiatry

## 2018-12-31 ENCOUNTER — Ambulatory Visit (HOSPITAL_COMMUNITY): Payer: BLUE CROSS/BLUE SHIELD | Admitting: Psychiatry

## 2019-01-08 ENCOUNTER — Ambulatory Visit: Payer: BLUE CROSS/BLUE SHIELD | Admitting: Family Medicine

## 2019-01-08 ENCOUNTER — Encounter: Payer: Self-pay | Admitting: Family Medicine

## 2019-01-08 VITALS — BP 118/80 | Temp 98.1°F | Ht 65.0 in | Wt 218.6 lb

## 2019-01-08 DIAGNOSIS — J019 Acute sinusitis, unspecified: Secondary | ICD-10-CM | POA: Diagnosis not present

## 2019-01-08 DIAGNOSIS — M67431 Ganglion, right wrist: Secondary | ICD-10-CM | POA: Diagnosis not present

## 2019-01-08 MED ORDER — AMOXICILLIN 500 MG PO CAPS: 500.0000 mg | ORAL_CAPSULE | Freq: Three times a day (TID) | ORAL | 0 refills | Status: AC

## 2019-01-08 NOTE — Progress Notes (Deleted)
   Subjective:    Patient ID: Donna Kim, female    DOB: December 04, 1980, 38 y.o.   MRN: [id number]  Sinusitis  This is a new problem. The current episode started 1 to 4 weeks ago. Associated symptoms include congestion, coughing, headaches and a sore throat. (Fever, wheezing) Treatments tried: zicam, dayquil, nyquil, essential oils.   Symptoms started over a week ago with rhinorrhea, watery eyes. Symptoms progressed with congestion, cough, fever early in the week. OTC cold meds helping some. Cough productive of green mucous.      Cyst on right wrist. Came up in November. Not painful now. Was in the beginning.    Review of Systems  HENT: Positive for congestion and sore throat.   Respiratory: Positive for cough.   Neurological: Positive for headaches.       Objective:   Physical Exam   Right maxillary, frontal sinus tenderness     Assessment & Plan:

## 2019-01-08 NOTE — Progress Notes (Signed)
   Subjective:    Patient ID: Donna Kim, female    DOB: Oct 20, 1981, 38 y.o.   MRN: [id number]  Sinusitis  This is a new problem. The current episode started 1 to 4 weeks ago. Associated symptoms include congestion, coughing, headaches, sinus pressure and a sore throat. Pertinent negatives include no shortness of breath. (Fever, wheezing) Treatments tried: zicam, dayquil, nyquil, essential oils.    Symptoms started over a week ago with rhinorrhea, watery eyes. Symptoms progressed with congestion, cough, fever early in the week. OTC cold meds helping some. Cough productive of green mucous.   Cyst on right wrist. Came up in November. Not painful now. Was in the beginning.    Review of Systems  Constitutional: Positive for fever.  HENT: Positive for congestion, sinus pressure and sore throat.   Eyes: Positive for redness and itching.  Respiratory: Positive for cough. Negative for shortness of breath and wheezing.   Gastrointestinal: Negative for diarrhea, nausea and vomiting.  Neurological: Positive for headaches.       Objective:   Physical Exam Vitals signs and nursing note reviewed.  Constitutional:      General: She is not in acute distress.    Appearance: Normal appearance. She is not toxic-appearing.  HENT:     Head: Normocephalic and atraumatic.     Right Ear: Tympanic membrane normal.     Left Ear: Tympanic membrane normal.     Nose: Congestion present.     Right Sinus: Maxillary sinus tenderness and frontal sinus tenderness present.     Left Sinus: Frontal sinus tenderness present. No maxillary sinus tenderness.     Mouth/Throat:     Mouth: Mucous membranes are moist.     Pharynx: Oropharynx is clear.  Eyes:     General:        Right eye: No discharge.        Left eye: No discharge.     Conjunctiva/sclera:     Right eye: Right conjunctiva is injected.     Left eye: Left conjunctiva is injected.  Neck:     Musculoskeletal: Neck supple. No neck rigidity.    Cardiovascular:     Rate and Rhythm: Normal rate and regular rhythm.     Heart sounds: Normal heart sounds.  Pulmonary:     Effort: Pulmonary effort is normal. No respiratory distress.     Breath sounds: Normal breath sounds.  Musculoskeletal:     Comments: Small soft, mobile cyst noted to dorsal aspect of right wrist. Non-tender. ROM intact.   Lymphadenopathy:     Cervical: No cervical adenopathy.  Skin:    General: Skin is warm and dry.  Neurological:     Mental Status: She is alert and oriented to person, place, and time.  Psychiatric:        Mood and Affect: Mood normal.           Assessment & Plan:  1. Acute rhinosinusitis Discussed likely post-viral sinusitis. Will treat with abx. Symptomatic care discussed. Warning signs discussed. F/u if symptoms worsen or fail to improve.   2. Ganglion cyst of wrist, right Discussed likely ganglion cyst, will typically resolve without intervention, may come and go. Discussed if becomes overly bothersome to patient she should let us know and we can refer her to specialist for possible removal.

## 2019-01-20 ENCOUNTER — Encounter: Payer: Self-pay | Admitting: Family Medicine

## 2019-01-20 ENCOUNTER — Ambulatory Visit: Payer: BLUE CROSS/BLUE SHIELD | Admitting: Family Medicine

## 2019-01-20 VITALS — Temp 98.1°F | Ht 65.0 in | Wt 221.2 lb

## 2019-01-20 DIAGNOSIS — J019 Acute sinusitis, unspecified: Secondary | ICD-10-CM | POA: Diagnosis not present

## 2019-01-20 MED ORDER — CLARITHROMYCIN 500 MG PO TABS: 500.0000 mg | ORAL_TABLET | Freq: Two times a day (BID) | ORAL | 0 refills | Status: AC

## 2019-01-20 NOTE — Progress Notes (Addendum)
Subjective:    Patient ID: Donna Kim, female    DOB: Feb 23, 1981, 38 y.o.   MRN: [id number]  Cough  This is a new problem. The current episode started 1 to 4 weeks ago. Associated symptoms include ear pain, nasal congestion and shortness of breath. Pertinent negatives include no chills, fever or wheezing.   Patient finished he Amoxil yesterday but has not got better still has bad cough and sinus issues- still yellow mucus. Feels weak has decreased energy. No known fevers. Reports some shortness of breath, doesn't think she's wheezing but unsure. States symptoms started initially almost 4 weeks ago.   Pt is concerned about there being mold in her house causing these problems, states she had a friend recently that had issues with this.   Not currently breastfeeding. Doesn't think she's pregnant but not using any form of birth control. LMP 12/14/18  Review of Systems  Constitutional: Negative for chills and fever.  HENT: Positive for congestion, ear pain and sinus pressure.   Eyes: Negative for discharge.  Respiratory: Positive for cough and shortness of breath. Negative for wheezing.   Gastrointestinal: Negative for diarrhea and vomiting.       Objective:   Physical Exam Vitals signs and nursing note reviewed.  Constitutional:      General: She is not in acute distress.    Appearance: Normal appearance. She is not toxic-appearing.  HENT:     Head: Normocephalic and atraumatic.     Right Ear: Tympanic membrane normal.     Left Ear: Tympanic membrane normal.     Nose: Congestion present.     Right Sinus: Maxillary sinus tenderness and frontal sinus tenderness present.     Left Sinus: Frontal sinus tenderness present. No maxillary sinus tenderness.     Mouth/Throat:     Mouth: Mucous membranes are moist.     Pharynx: Oropharynx is clear.  Eyes:     General:        Right eye: No discharge.        Left eye: No discharge.  Neck:     Musculoskeletal: Neck supple. No neck  rigidity.  Cardiovascular:     Rate and Rhythm: Normal rate and regular rhythm.     Heart sounds: Normal heart sounds.  Pulmonary:     Effort: Pulmonary effort is normal. No respiratory distress.     Breath sounds: Normal breath sounds. No wheezing or rales.  Lymphadenopathy:     Cervical: No cervical adenopathy.  Skin:    General: Skin is warm and dry.  Neurological:     Mental Status: She is alert and oriented to person, place, and time.  Psychiatric:        Behavior: Behavior normal.     Today's Vitals   01/20/19 1109  Temp: 98.1 F (36.7 C)  TempSrc: Oral  SpO2: 99%  Weight: 221 lb 3.2 oz (100.3 kg)  Height: 5\' 5"  (1.651 m)   Body mass index is 36.81 kg/m.     Assessment & Plan:  Acute rhinosinusitis  Pt with continued sinusitis symptoms. Lung sounds clear, no distress on exam today. Feel we can hold off on chest x-ray at this time. Will change abx to biaxin bid x 10 days. No wheezing on exam today, offered albuterol inhaler prescription to have on hand if she feels she needs it for prn use, pt declined. Symptomatic care discussed. Warning signs discussed. If she does not notice gradual improvement in her symptoms over the next several days  or worsening she should f/u, may consider chest x-ray at that time.   Dr. Mickie Hillier was consulted on this case and is in agreement with the above treatment plan.

## 2019-01-21 ENCOUNTER — Ambulatory Visit (HOSPITAL_COMMUNITY): Payer: BLUE CROSS/BLUE SHIELD | Admitting: Psychiatry

## 2019-02-04 ENCOUNTER — Ambulatory Visit (INDEPENDENT_AMBULATORY_CARE_PROVIDER_SITE_OTHER): Payer: BLUE CROSS/BLUE SHIELD | Admitting: Adult Health

## 2019-02-04 ENCOUNTER — Telehealth: Payer: Self-pay | Admitting: Adult Health

## 2019-02-04 ENCOUNTER — Encounter: Payer: Self-pay | Admitting: Adult Health

## 2019-02-04 VITALS — BP 138/96 | HR 96 | Temp 99.0°F | Ht 65.0 in | Wt 219.0 lb

## 2019-02-04 DIAGNOSIS — Z3201 Encounter for pregnancy test, result positive: Secondary | ICD-10-CM | POA: Insufficient documentation

## 2019-02-04 DIAGNOSIS — J111 Influenza due to unidentified influenza virus with other respiratory manifestations: Secondary | ICD-10-CM | POA: Diagnosis not present

## 2019-02-04 DIAGNOSIS — O09521 Supervision of elderly multigravida, first trimester: Secondary | ICD-10-CM | POA: Insufficient documentation

## 2019-02-04 DIAGNOSIS — O3680X0 Pregnancy with inconclusive fetal viability, not applicable or unspecified: Secondary | ICD-10-CM

## 2019-02-04 DIAGNOSIS — Z3A01 Less than 8 weeks gestation of pregnancy: Secondary | ICD-10-CM

## 2019-02-04 LAB — INFLUENZA A AND B, RT PCR
Influenza A PCR: NEGATIVE
Influenza B PCR: NEGATIVE

## 2019-02-04 LAB — POCT URINE PREGNANCY: Preg Test, Ur: POSITIVE — AB

## 2019-02-04 MED ORDER — OSELTAMIVIR PHOSPHATE 75 MG PO CAPS: 75.0000 mg | ORAL_CAPSULE | Freq: Two times a day (BID) | ORAL | 0 refills | Status: DC

## 2019-02-04 NOTE — Progress Notes (Signed)
Patient ID: Donna Kim, female   DOB: September 25, 1981, 38 y.o.   MRN: [id number] History of Present Illness: Donna Kim is a 38 year old white female, married in for UPT, has missed a period and had 2+HPTs. Daughter + strept recently. Pt treated for sinus infection and bronchitis, took amoxicillin and another antibiotic. PCP is Mickie Hillier.    Current Medications, Allergies, Past Medical History, Past Surgical History, Family History and Social History were reviewed in [employer] record.     Review of Systems: +missed period +2 HPTs +headache for few days  +sorethroat  +tired and stiff Mild nausea    Physical Exam:BP (!) 138/96 (BP Location: Left Arm, Patient Position: Sitting, Cuff Size: Large)   Pulse 96   Temp 99 F (37.2 C)   Ht 5\' 5"  (1.651 m)   Wt 219 lb (99.3 kg)   LMP 12/14/2018   Breastfeeding No   BMI 36.44 kg/m   UPT +, about 7+2 weeks, by LMP with EDD 09/21/19 General:  Well developed, well nourished, no acute distress Skin:  Warm and dry Neck:  Midline trachea, normal thyroid, good ROM, no lymphadenopathy,tneder,throat red,no pustules, ears clear,flu swab obtained. Lungs; Clear to auscultation bilaterally Cardiovascular: Regular rate and rhythm Abdomen:  Soft, non tender  Psych:  No mood changes, alert and cooperative,seems happy Fall risk is low.   Impression: 1. Pregnancy examination or test, positive result   2. Less than [redacted] weeks gestation of pregnancy   3. Encounter to determine fetal viability of pregnancy, single or unspecified fetus   4. Flu   5. Multigravida of advanced maternal age in first trimester       Plan: Meds ordered this encounter  Medications  . oseltamivir (TAMIFLU) 75 MG capsule    Sig: Take 1 capsule (75 mg total) by mouth 2 (two) times daily.    Dispense:  10 capsule    Refill:  0    Order Specific Question:   Supervising Provider    Answer:   Tania Ade H [[id number]]  Return in 1 week for dating US/3 weeks  new OB Flu swab sent by pt  Review handouts for first trimester and by [facility]

## 2019-02-04 NOTE — Telephone Encounter (Signed)
Pt aware that Flu swab was negative for A&B,just save tamiflu

## 2019-02-04 NOTE — Patient Instructions (Signed)
First Trimester of Pregnancy  The first trimester of pregnancy is from week 1 until the end of week 13 (months 1 through 3). A week after a sperm fertilizes an egg, the egg will implant on the wall of the uterus. This embryo will begin to develop into a baby. Genes from you and your partner will form the baby. The female genes will determine whether the baby will be a boy or a girl. At 6-8 weeks, the eyes and face will be formed, and the heartbeat can be seen on ultrasound. At the end of 12 weeks, all the baby's organs will be formed.  Now that you are pregnant, you will want to do everything you can to have a healthy baby. Two of the most important things are to get good prenatal care and to follow your health care provider's instructions. Prenatal care is all the medical care you receive before the baby's birth. This care will help prevent, find, and treat any problems during the pregnancy and childbirth.  Body changes during your first trimester  Your body goes through many changes during pregnancy. The changes vary from woman to woman.   You may gain or lose a couple of pounds at first.   You may feel sick to your stomach (nauseous) and you may throw up (vomit). If the vomiting is uncontrollable, call your health care provider.   You may tire easily.   You may develop headaches that can be relieved by medicines. All medicines should be approved by your health care provider.   You may urinate more often. Painful urination may mean you have a bladder infection.   You may develop heartburn as a result of your pregnancy.   You may develop constipation because certain hormones are causing the muscles that push stool through your intestines to slow down.   You may develop hemorrhoids or swollen veins (varicose veins).   Your breasts may begin to grow larger and become tender. Your nipples may stick out more, and the tissue that surrounds them (areola) may become darker.   Your gums may bleed and may be  sensitive to brushing and flossing.   Dark spots or blotches (chloasma, mask of pregnancy) may develop on your face. This will likely fade after the baby is born.   Your menstrual periods will stop.   You may have a loss of appetite.   You may develop cravings for certain kinds of food.   You may have changes in your emotions from day to day, such as being excited to be pregnant or being concerned that something may go wrong with the pregnancy and baby.   You may have more vivid and strange dreams.   You may have changes in your hair. These can include thickening of your hair, rapid growth, and changes in texture. Some women also have hair loss during or after pregnancy, or hair that feels dry or thin. Your hair will most likely return to normal after your baby is born.  What to expect at prenatal visits  During a routine prenatal visit:   You will be weighed to make sure you and the baby are growing normally.   Your blood pressure will be taken.   Your abdomen will be measured to track your baby's growth.   The fetal heartbeat will be listened to between weeks 10 and 14 of your pregnancy.   Test results from any previous visits will be discussed.  Your health care provider may ask you:     How you are feeling.   If you are feeling the baby move.   If you have had any abnormal symptoms, such as leaking fluid, bleeding, severe headaches, or abdominal cramping.   If you are using any tobacco products, including cigarettes, chewing tobacco, and electronic cigarettes.   If you have any questions.  Other tests that may be performed during your first trimester include:   Blood tests to find your blood type and to check for the presence of any previous infections. The tests will also be used to check for low iron levels (anemia) and protein on red blood cells (Rh antibodies). Depending on your risk factors, or if you previously had diabetes during pregnancy, you may have tests to check for high blood sugar  that affects pregnant women (gestational diabetes).   Urine tests to check for infections, diabetes, or protein in the urine.   An ultrasound to confirm the proper growth and development of the baby.   Fetal screens for spinal cord problems (spina bifida) and Down syndrome.   HIV (human immunodeficiency virus) testing. Routine prenatal testing includes screening for HIV, unless you choose not to have this test.   You may need other tests to make sure you and the baby are doing well.  Follow these instructions at home:  Medicines   Follow your health care provider's instructions regarding medicine use. Specific medicines may be either safe or unsafe to take during pregnancy.   Take a prenatal vitamin that contains at least 600 micrograms (mcg) of folic acid.   If you develop constipation, try taking a stool softener if your health care provider approves.  Eating and drinking     Eat a balanced diet that includes fresh fruits and vegetables, whole grains, good sources of protein such as meat, eggs, or tofu, and low-fat dairy. Your health care provider will help you determine the amount of weight gain that is right for you.   Avoid raw meat and uncooked cheese. These carry germs that can cause birth defects in the baby.   Eating four or five small meals rather than three large meals a day may help relieve nausea and vomiting. If you start to feel nauseous, eating a few soda crackers can be helpful. Drinking liquids between meals, instead of during meals, also seems to help ease nausea and vomiting.   Limit foods that are high in fat and processed sugars, such as fried and sweet foods.   To prevent constipation:  ? Eat foods that are high in fiber, such as fresh fruits and vegetables, whole grains, and beans.  ? Drink enough fluid to keep your urine clear or pale yellow.  Activity   Exercise only as directed by your health care provider. Most women can continue their usual exercise routine during  pregnancy. Try to exercise for 30 minutes at least 5 days a week. Exercising will help you:  ? Control your weight.  ? Stay in shape.  ? Be prepared for labor and delivery.   Experiencing pain or cramping in the lower abdomen or lower back is a good sign that you should stop exercising. Check with your health care provider before continuing with normal exercises.   Try to avoid standing for long periods of time. Move your legs often if you must stand in one place for a long time.   Avoid heavy lifting.   Wear low-heeled shoes and practice good posture.   You may continue to have sex unless your health care   provider tells you not to.  Relieving pain and discomfort   Wear a good support bra to relieve breast tenderness.   Take warm sitz baths to soothe any pain or discomfort caused by hemorrhoids. Use hemorrhoid cream if your health care provider approves.   Rest with your legs elevated if you have leg cramps or low back pain.   If you develop varicose veins in your legs, wear support hose. Elevate your feet for 15 minutes, 3-4 times a day. Limit salt in your diet.  Prenatal care   Schedule your prenatal visits by the twelfth week of pregnancy. They are usually scheduled monthly at first, then more often in the last 2 months before delivery.   Write down your questions. Take them to your prenatal visits.   Keep all your prenatal visits as told by your health care provider. This is important.  Safety   Wear your seat belt at all times when driving.   Make a list of emergency phone numbers, including numbers for family, friends, the hospital, and police and fire departments.  General instructions   Ask your health care provider for a referral to a local prenatal education class. Begin classes no later than the beginning of month 6 of your pregnancy.   Ask for help if you have counseling or nutritional needs during pregnancy. Your health care provider can offer advice or refer you to specialists for help  with various needs.   Do not use hot tubs, steam rooms, or saunas.   Do not douche or use tampons or scented sanitary pads.   Do not cross your legs for long periods of time.   Avoid cat litter boxes and soil used by cats. These carry germs that can cause birth defects in the baby and possibly loss of the fetus by miscarriage or stillbirth.   Avoid all smoking, herbs, alcohol, and medicines not prescribed by your health care provider. Chemicals in these products affect the formation and growth of the baby.   Do not use any products that contain nicotine or tobacco, such as cigarettes and e-cigarettes. If you need help quitting, ask your health care provider. You may receive counseling support and other resources to help you quit.   Schedule a dentist appointment. At home, brush your teeth with a soft toothbrush and be gentle when you floss.  Contact a health care provider if:   You have dizziness.   You have mild pelvic cramps, pelvic pressure, or nagging pain in the abdominal area.   You have persistent nausea, vomiting, or diarrhea.   You have a bad smelling vaginal discharge.   You have pain when you urinate.   You notice increased swelling in your face, hands, legs, or ankles.   You are exposed to fifth disease or chickenpox.   You are exposed to German measles (rubella) and have never had it.  Get help right away if:   You have a fever.   You are leaking fluid from your vagina.   You have spotting or bleeding from your vagina.   You have severe abdominal cramping or pain.   You have rapid weight gain or loss.   You vomit blood or material that looks like coffee grounds.   You develop a severe headache.   You have shortness of breath.   You have any kind of trauma, such as from a fall or a car accident.  Summary   The first trimester of pregnancy is from week 1 until   the end of week 13 (months 1 through 3).   Your body goes through many changes during pregnancy. The changes vary from  woman to woman.   You will have routine prenatal visits. During those visits, your health care provider will examine you, discuss any test results you may have, and talk with you about how you are feeling.  This information is not intended to replace advice given to you by your health care provider. Make sure you discuss any questions you have with your health care provider.  Document Released: 11/12/2001 Document Revised: 10/30/2016 Document Reviewed: 10/30/2016  Elsevier Interactive Patient Education  2019 [employer]

## 2019-02-10 ENCOUNTER — Ambulatory Visit (INDEPENDENT_AMBULATORY_CARE_PROVIDER_SITE_OTHER): Payer: BLUE CROSS/BLUE SHIELD

## 2019-02-10 ENCOUNTER — Other Ambulatory Visit: Payer: Self-pay

## 2019-02-10 DIAGNOSIS — Z3A08 8 weeks gestation of pregnancy: Secondary | ICD-10-CM

## 2019-02-10 DIAGNOSIS — O3680X0 Pregnancy with inconclusive fetal viability, not applicable or unspecified: Secondary | ICD-10-CM | POA: Diagnosis not present

## 2019-02-10 NOTE — Progress Notes (Signed)
[place] 8+2 wks,single IUP w/ys,positive fht 157 bpm,normal ovaries bilat,crl 14.22 mm

## 2019-02-17 ENCOUNTER — Telehealth: Payer: Self-pay | Admitting: Women's Health

## 2019-02-17 NOTE — Telephone Encounter (Signed)
Patient called, she's pregnant and wants to know what the recommendations are for pregnant patients due to the virus.  [phone]

## 2019-02-17 NOTE — Telephone Encounter (Signed)
Spoke with pt letting her know just standard precautions at this time. Pt voiced understanding. [place]

## 2019-02-24 ENCOUNTER — Telehealth: Payer: Self-pay | Admitting: *Deleted

## 2019-02-24 DIAGNOSIS — Z3490 Encounter for supervision of normal pregnancy, unspecified, unspecified trimester: Secondary | ICD-10-CM | POA: Insufficient documentation

## 2019-02-24 DIAGNOSIS — Z3481 Encounter for supervision of other normal pregnancy, first trimester: Secondary | ICD-10-CM

## 2019-02-24 NOTE — Telephone Encounter (Signed)
Due to Coronavirus, pt offered [employer] optimized schedule, she accepts, agrees to check bp's weekly. Next scheduled visit will be at 13wks, for nt/it and new ob visit. Kristeen Miss Cresenzo 02/24/2019 10:45 AM

## 2019-02-25 ENCOUNTER — Ambulatory Visit: Payer: Self-pay | Admitting: *Deleted

## 2019-02-25 ENCOUNTER — Encounter: Payer: Self-pay | Admitting: Women's Health

## 2019-03-09 ENCOUNTER — Encounter: Payer: Self-pay | Admitting: *Deleted

## 2019-03-11 ENCOUNTER — Telehealth: Payer: Self-pay | Admitting: *Deleted

## 2019-03-11 NOTE — Telephone Encounter (Signed)
Patient called stating she is having high blood pressure and would like to talk to Donna Kim about it. Please advise

## 2019-03-11 NOTE — Telephone Encounter (Signed)
Patient states she has been checking her BP and readings have been elevated for her. 136/80 and 125/87.  Informed those were not severe range and to continue checking and the provider can address at her next visit as she may have underlying CHTN.  Verbalized understanding.

## 2019-03-15 ENCOUNTER — Encounter: Payer: Self-pay | Admitting: *Deleted

## 2019-03-16 ENCOUNTER — Other Ambulatory Visit: Payer: Self-pay | Admitting: Obstetrics and Gynecology

## 2019-03-16 ENCOUNTER — Encounter: Payer: Self-pay | Admitting: *Deleted

## 2019-03-16 DIAGNOSIS — Z3682 Encounter for antenatal screening for nuchal translucency: Secondary | ICD-10-CM

## 2019-03-17 ENCOUNTER — Other Ambulatory Visit: Payer: Self-pay

## 2019-03-17 ENCOUNTER — Ambulatory Visit (INDEPENDENT_AMBULATORY_CARE_PROVIDER_SITE_OTHER): Payer: BLUE CROSS/BLUE SHIELD

## 2019-03-17 ENCOUNTER — Encounter: Payer: Self-pay | Admitting: Women's Health

## 2019-03-17 ENCOUNTER — Ambulatory Visit (INDEPENDENT_AMBULATORY_CARE_PROVIDER_SITE_OTHER): Payer: BLUE CROSS/BLUE SHIELD | Admitting: Women's Health

## 2019-03-17 VITALS — BP 112/79 | HR 77 | Wt 222.0 lb

## 2019-03-17 DIAGNOSIS — Z3402 Encounter for supervision of normal first pregnancy, second trimester: Secondary | ICD-10-CM

## 2019-03-17 DIAGNOSIS — Z3A13 13 weeks gestation of pregnancy: Secondary | ICD-10-CM

## 2019-03-17 DIAGNOSIS — Z331 Pregnant state, incidental: Secondary | ICD-10-CM

## 2019-03-17 DIAGNOSIS — Z1389 Encounter for screening for other disorder: Secondary | ICD-10-CM

## 2019-03-17 DIAGNOSIS — Z363 Encounter for antenatal screening for malformations: Secondary | ICD-10-CM

## 2019-03-17 DIAGNOSIS — O09291 Supervision of pregnancy with other poor reproductive or obstetric history, first trimester: Secondary | ICD-10-CM

## 2019-03-17 DIAGNOSIS — Z3482 Encounter for supervision of other normal pregnancy, second trimester: Secondary | ICD-10-CM

## 2019-03-17 DIAGNOSIS — Z8759 Personal history of other complications of pregnancy, childbirth and the puerperium: Secondary | ICD-10-CM | POA: Diagnosis not present

## 2019-03-17 DIAGNOSIS — Z8679 Personal history of other diseases of the circulatory system: Secondary | ICD-10-CM

## 2019-03-17 DIAGNOSIS — F418 Other specified anxiety disorders: Secondary | ICD-10-CM

## 2019-03-17 DIAGNOSIS — Z3682 Encounter for antenatal screening for nuchal translucency: Secondary | ICD-10-CM

## 2019-03-17 DIAGNOSIS — Z3481 Encounter for supervision of other normal pregnancy, first trimester: Secondary | ICD-10-CM

## 2019-03-17 DIAGNOSIS — Z3490 Encounter for supervision of normal pregnancy, unspecified, unspecified trimester: Secondary | ICD-10-CM | POA: Insufficient documentation

## 2019-03-17 DIAGNOSIS — O99341 Other mental disorders complicating pregnancy, first trimester: Secondary | ICD-10-CM

## 2019-03-17 LAB — POCT URINALYSIS DIPSTICK OB
Glucose, UA: NEGATIVE
Ketones, UA: NEGATIVE
Leukocytes, UA: NEGATIVE
Nitrite, UA: NEGATIVE
POC,PROTEIN,UA: NEGATIVE

## 2019-03-17 MED ORDER — METRONIDAZOLE 1 % EX CREA: TOPICAL_CREAM | Freq: Every day | CUTANEOUS | 0 refills | Status: DC

## 2019-03-17 NOTE — Progress Notes (Signed)
[place] 13+2 wks,measurements c/w dates,crl 69.23 mm,normal ovaries bilat,posterior placenta gr 0,NB present,NT 1.9 mm,fhr 138 bpm

## 2019-03-17 NOTE — Progress Notes (Signed)
TELEHEALTH VIRTUAL INITIAL OBSTETRICAL VISIT ENCOUNTER NOTE Patient name: Donna Kim MRN [id number]  Date of birth: Aug 10, 1981  I connected with Donna Kim on 03/17/19 at 11:45 AM EDT by [facility] and verified that I am speaking with the correct person using two identifiers. Due to COVID-19 recommendations, pt is not currently in our office.    I discussed the limitations, risks, security and privacy concerns of performing an evaluation and management service by telephone and the availability of in person appointments. I also discussed with the patient that there may be a patient responsible charge related to this service. The patient expressed understanding and agreed to proceed.  Chief Complaint:   Initial Prenatal Visit (1st IT)  History of Present Illness:   Donna Kim is a 38 y.o. G26P3003 Caucasian female at [redacted]w[redacted]d by LMP c/w 7wk u/s, with an Estimated Date of Delivery: 09/20/19 being evaluated today for her initial obstetrical visit.   Her obstetrical history is significant for term SVB x 3, last pregnancy complicated by ICP and pp HTN requiring meds.   Today she reports rosacea w/ bumps worsening, used metronidazole cream during 2015 pregnancy that worked well. Dep/anx- no meds in awhile, doing well Patient's last menstrual period was 12/14/2018. Last pap 04/14/17. Results were: normal Review of Systems:   Pertinent items are noted in HPI Denies cramping/contractions, leakage of fluid, vaginal bleeding, abnormal vaginal discharge w/ itching/odor/irritation, headaches, visual changes, shortness of breath, chest pain, abdominal pain, severe nausea/vomiting, or problems with urination or bowel movements unless otherwise stated above.  Pertinent History Reviewed:  Reviewed past medical,surgical, social, obstetrical and family history.  Reviewed problem list, medications and allergies. OB History  Gravida Para Term Preterm AB Living  4 3 3     3   SAB TAB Ectopic Multiple Live  Births        0 3    # Outcome Date GA Lbr Len/2nd Weight Sex Delivery Anes PTL Lv  4 Current           3 Term 10/25/17 [redacted]w[redacted]d 01:44 / 00:06 5 lb 10.8 oz (2.574 kg) F Vag-Spont EPI  LIV  2 Term 07/04/14 [redacted]w[redacted]d 01:59 / 00:08 8 lb 15.4 oz (4.065 kg) F Vag-Spont None N LIV  1 Term 01/02/12 [redacted]w[redacted]d 10:50 / 05:14 7 lb 2.6 oz (3.249 kg) F Vag-Spont EPI N LIV     Birth Comments: none   Physical Assessment:   Vitals:   03/17/19 1124  BP: 112/79  Pulse: 77  Weight: 222 lb (100.7 kg)  Body mass index is 36.94 kg/m.       Physical Examination:  General:  Alert, oriented and cooperative.   Mental Status: Normal Kim and affect perceived. Normal judgment and thought content.  Rest of physical exam deferred due to type of encounter  Results for orders placed or performed in visit on 03/17/19 (from the past 24 hour(s))  POC Urinalysis Dipstick OB   Collection Time: 03/17/19 11:20 AM  Result Value Ref Range   Color, UA     Clarity, UA     Glucose, UA Negative Negative   Bilirubin, UA     Ketones, UA neg    Spec Grav, UA     Blood, UA trace    pH, UA     POC,PROTEIN,UA Negative Negative, Trace, Small (1+), Moderate (2+), Large (3+), 4+   Urobilinogen, UA     Nitrite, UA neg    Leukocytes, UA Negative Negative   Appearance  Odor     Today's NT [place] 13+2 wks,measurements c/w dates,crl 69.23 mm,normal ovaries bilat,posterior placenta gr 0,NB present,NT 1.9 mm,fhr 138 bpm Assessment & Plan:  1) Low-Risk Pregnancy G4P3003 at [redacted]w[redacted]d with an Estimated Date of Delivery: 09/20/19   2) Initial OB visit  3) H/O ICP  4) H/O PPHTN> will get baseline labs today, start ASA 162mg   5) Dep/anx> no meds, doing well  Meds:  Meds ordered this encounter  Medications  . metronidazole (NORITATE) 1 % cream    Sig: Apply topically daily.    Dispense:  60 g    Refill:  0    Order Specific Question:   Supervising Provider    Answer:   Tania Ade H [[id number]]    Initial labs obtained Continue  prenatal vitamins Reviewed n/v relief measures and warning s/s to report Reviewed recommended weight gain based on pre-gravid BMI Encouraged well-balanced diet Genetic Screening discussed: requested nt/it Cystic fibrosis, SMA, Fragile X screening discussed declined Ultrasound discussed; fetal survey: requested CCNC completed>PCM not here, faxed Has home BP cuff Check BP weekly, let us know if >140/90   I discussed the assessment and treatment plan with the patient. The patient was provided an opportunity to ask questions and all were answered. The patient agreed with the plan and demonstrated an understanding of the instructions.   The patient was advised to call back or seek an in-person evaluation/go to the ED for any concerning postpartum symptoms.  I provided 15 minutes of non-face-to-face time during this encounter.  Follow-up: Return in about 5 weeks (around 04/21/2019) for LROB, 2nd IT, [id number].   Orders Placed This Encounter  Procedures  . GC/Chlamydia Probe Amp  . Urine Culture  . US OB Comp + 14 Wk  . Integrated 1  . Obstetric Panel, Including HIV  . Urinalysis, Routine w reflex microscopic  . Sickle cell screen  . Pain Management Screening Profile (10S)  . Comprehensive metabolic panel  . Protein / creatinine ratio, urine  . POC Urinalysis Dipstick OB    Roma Schanz CNM, [facility] 03/17/2019 1:47 PM

## 2019-03-17 NOTE — Patient Instructions (Addendum)
Donna Kim, I greatly value your feedback.  If you receive a survey following your visit with [place] today, we appreciate you taking the time to fill it out.  Thanks, [name], CNM, [facility]  [place]!!! It is now [place] at [facility] ([place], [place] [address]) Entrance located off of [address] parking    Begin taking 162mg  (two 81mg  tablets) baby aspirin daily to decrease risk of preeclampsia during pregnancy    Nausea & Vomiting  Have saltine crackers or pretzels by your bed and eat a few bites before you raise your head out of bed in the morning  Eat small frequent meals throughout the day instead of large meals  Drink plenty of fluids throughout the day to stay hydrated, just don't drink a lot of fluids with your meals.  This can make your stomach fill up faster making you feel sick  Do not brush your teeth right after you eat  Products with real ginger are good for nausea, like ginger ale and ginger hard candy Make sure it says made with real ginger!  Sucking on sour candy like lemon heads is also good for nausea  If your prenatal vitamins make you nauseated, take them at night so you will sleep through the nausea  Sea Bands  If you feel like you need medicine for the nausea & vomiting please let us know  If you are unable to keep any fluids or food down please let us know   Constipation  Drink plenty of fluid, preferably water, throughout the day  Eat foods high in fiber such as fruits, vegetables, and grains  Exercise, such as walking, is a good way to keep your bowels regular  Drink warm fluids, especially warm prune juice, or decaf coffee  Eat a 1/2 cup of real oatmeal (not instant), 1/2 cup applesauce, and 1/2-1 cup warm prune juice every day  If needed, you may take Colace (docusate sodium) stool softener once or twice a day to help keep the stool soft. If you are pregnant, wait until  you are out of your first trimester (12-14 weeks of pregnancy)  If you still are having problems with constipation, you may take Miralax once daily as needed to help keep your bowels regular.  If you are pregnant, wait until you are out of your first trimester (12-14 weeks of pregnancy)  Home Blood Pressure Monitoring for Patients   Your provider has recommended that you check your blood pressure (BP) at least once a week at home. If you do not have a blood pressure cuff at home, one will be provided for you. Contact your provider if you have not received your monitor within 1 week.   Helpful Tips for Accurate Home Blood Pressure Checks  . Don't smoke, exercise, or drink caffeine 30 minutes before checking your BP . Use the restroom before checking your BP (a full bladder can raise your pressure) . Relax in a comfortable upright chair . Feet on the ground . Left arm resting comfortably on a flat surface at the level of your heart . Legs uncrossed . Back supported . Sit quietly and don't talk . Place the cuff on your bare arm . Adjust snuggly, so that only two fingertips can fit between your skin and the top of the cuff . Check 2 readings separated by at least one minute . Keep a log of your BP readings . For  a visual, please reference this diagram: http://ccnc.care/bpdiagram  Provider Name: [facility]     Phone: [phone]  Zone 1: ALL CLEAR  Continue to monitor your symptoms:  . BP reading is less than 140 (top number) or less than 90 (bottom number)  . No right upper stomach pain . No headaches or seeing spots . No feeling nauseated or throwing up . No swelling in face and hands  Zone 2: CAUTION Call your doctor's office for any of the following:  . BP reading is greater than 140 (top number) or greater than 90 (bottom number)  . Stomach pain under your ribs in the middle or right side . Headaches or seeing spots . Feeling nauseated or throwing up . Swelling in face  and hands  Zone 3: EMERGENCY  Seek immediate medical care if you have any of the following:  . BP reading is greater than160 (top number) or greater than 110 (bottom number) . Severe headaches not improving with Tylenol . Serious difficulty catching your breath . Any worsening symptoms from Zone 2     First Trimester of Pregnancy The first trimester of pregnancy is from week 1 until the end of week 12 (months 1 through 3). A week after a sperm fertilizes an egg, the egg will implant on the wall of the uterus. This embryo will begin to develop into a baby. Genes from you and your partner are forming the baby. The female genes determine whether the baby is a boy or a girl. At 6-8 weeks, the eyes and face are formed, and the heartbeat can be seen on ultrasound. At the end of 12 weeks, all the baby's organs are formed.  Now that you are pregnant, you will want to do everything you can to have a healthy baby. Two of the most important things are to get good prenatal care and to follow your health care provider's instructions. Prenatal care is all the medical care you receive before the baby's birth. This care will help prevent, find, and treat any problems during the pregnancy and childbirth. BODY CHANGES Your body goes through many changes during pregnancy. The changes vary from woman to woman.   You may gain or lose a couple of pounds at first.  You may feel sick to your stomach (nauseous) and throw up (vomit). If the vomiting is uncontrollable, call your health care provider.  You may tire easily.  You may develop headaches that can be relieved by medicines approved by your health care provider.  You may urinate more often. Painful urination may mean you have a bladder infection.  You may develop heartburn as a result of your pregnancy.  You may develop constipation because certain hormones are causing the muscles that push waste through your intestines to slow down.  You may develop  hemorrhoids or swollen, bulging veins (varicose veins).  Your breasts may begin to grow larger and become tender. Your nipples may stick out more, and the tissue that surrounds them (areola) may become darker.  Your gums may bleed and may be sensitive to brushing and flossing.  Dark spots or blotches (chloasma, mask of pregnancy) may develop on your face. This will likely fade after the baby is born.  Your menstrual periods will stop.  You may have a loss of appetite.  You may develop cravings for certain kinds of food.  You may have changes in your emotions from day to day, such as being excited to be pregnant or being concerned  that something may go wrong with the pregnancy and baby.  You may have more vivid and strange dreams.  You may have changes in your hair. These can include thickening of your hair, rapid growth, and changes in texture. Some women also have hair loss during or after pregnancy, or hair that feels dry or thin. Your hair will most likely return to normal after your baby is born. WHAT TO EXPECT AT YOUR PRENATAL VISITS During a routine prenatal visit:  You will be weighed to make sure you and the baby are growing normally.  Your blood pressure will be taken.  Your abdomen will be measured to track your baby's growth.  The fetal heartbeat will be listened to starting around week 10 or 12 of your pregnancy.  Test results from any previous visits will be discussed. Your health care provider may ask you:  How you are feeling.  If you are feeling the baby move.  If you have had any abnormal symptoms, such as leaking fluid, bleeding, severe headaches, or abdominal cramping.  If you have any questions. Other tests that may be performed during your first trimester include:  Blood tests to find your blood type and to check for the presence of any previous infections. They will also be used to check for low iron levels (anemia) and Rh antibodies. Later in the  pregnancy, blood tests for diabetes will be done along with other tests if problems develop.  Urine tests to check for infections, diabetes, or protein in the urine.  An ultrasound to confirm the proper growth and development of the baby.  An amniocentesis to check for possible genetic problems.  Fetal screens for spina bifida and Down syndrome.  You may need other tests to make sure you and the baby are doing well. HOME CARE INSTRUCTIONS  Medicines  Follow your health care provider's instructions regarding medicine use. Specific medicines may be either safe or unsafe to take during pregnancy.  Take your prenatal vitamins as directed.  If you develop constipation, try taking a stool softener if your health care provider approves. Diet  Eat regular, well-balanced meals. Choose a variety of foods, such as meat or vegetable-based protein, fish, milk and low-fat dairy products, vegetables, fruits, and whole grain breads and cereals. Your health care provider will help you determine the amount of weight gain that is right for you.  Avoid raw meat and uncooked cheese. These carry germs that can cause birth defects in the baby.  Eating four or five small meals rather than three large meals a day may help relieve nausea and vomiting. If you start to feel nauseous, eating a few soda crackers can be helpful. Drinking liquids between meals instead of during meals also seems to help nausea and vomiting.  If you develop constipation, eat more high-fiber foods, such as fresh vegetables or fruit and whole grains. Drink enough fluids to keep your urine clear or pale yellow. Activity and Exercise  Exercise only as directed by your health care provider. Exercising will help you:  Control your weight.  Stay in shape.  Be prepared for labor and delivery.  Experiencing pain or cramping in the lower abdomen or low back is a good sign that you should stop exercising. Check with your health care  provider before continuing normal exercises.  Try to avoid standing for long periods of time. Move your legs often if you must stand in one place for a long time.  Avoid heavy lifting.  Wear low-heeled  shoes, and practice good posture.  You may continue to have sex unless your health care provider directs you otherwise. Relief of Pain or Discomfort  Wear a good support bra for breast tenderness.    Take warm sitz baths to soothe any pain or discomfort caused by hemorrhoids. Use hemorrhoid cream if your health care provider approves.    Rest with your legs elevated if you have leg cramps or low back pain.  If you develop varicose veins in your legs, wear support hose. Elevate your feet for 15 minutes, 3-4 times a day. Limit salt in your diet. Prenatal Care  Schedule your prenatal visits by the twelfth week of pregnancy. They are usually scheduled monthly at first, then more often in the last 2 months before delivery.  Write down your questions. Take them to your prenatal visits.  Keep all your prenatal visits as directed by your health care provider. Safety  Wear your seat belt at all times when driving.  Make a list of emergency phone numbers, including numbers for family, friends, the hospital, and police and fire departments. General Tips  Ask your health care provider for a referral to a local prenatal education class. Begin classes no later than at the beginning of month 6 of your pregnancy.  Ask for help if you have counseling or nutritional needs during pregnancy. Your health care provider can offer advice or refer you to specialists for help with various needs.  Do not use hot tubs, steam rooms, or saunas.  Do not douche or use tampons or scented sanitary pads.  Do not cross your legs for long periods of time.  Avoid cat litter boxes and soil used by cats. These carry germs that can cause birth defects in the baby and possibly loss of the fetus by miscarriage or  stillbirth.  Avoid all smoking, herbs, alcohol, and medicines not prescribed by your health care provider. Chemicals in these affect the formation and growth of the baby.  Schedule a dentist appointment. At home, brush your teeth with a soft toothbrush and be gentle when you floss. SEEK MEDICAL CARE IF:   You have dizziness.  You have mild pelvic cramps, pelvic pressure, or nagging pain in the abdominal area.  You have persistent nausea, vomiting, or diarrhea.  You have a bad smelling vaginal discharge.  You have pain with urination.  You notice increased swelling in your face, hands, legs, or ankles. SEEK IMMEDIATE MEDICAL CARE IF:   You have a fever.  You are leaking fluid from your vagina.  You have spotting or bleeding from your vagina.  You have severe abdominal cramping or pain.  You have rapid weight gain or loss.  You vomit blood or material that looks like coffee grounds.  You are exposed to [place] measles and have never had them.  You are exposed to fifth disease or chickenpox.  You develop a severe headache.  You have shortness of breath.  You have any kind of trauma, such as from a fall or a car accident. Document Released: 11/12/2001 Document Revised: 04/04/2014 Document Reviewed: 09/28/2013 [facility] Patient Information 2015 [place], [place]. This information is not intended to replace advice given to you by your health care provider. Make sure you discuss any questions you have with your health care provider.  .Coronavirus (COVID-19) Are you at risk?  Are you at risk for the Coronavirus (COVID-19)?  To be considered HIGH RISK for Coronavirus (COVID-19), you have to meet the following criteria:  . Traveled  to [place], [place], [place], [place] or [place]; or in the [place] to [place], [place], [place], or [place]; and have fever, cough, and shortness of breath within the last 2 weeks of travel OR . Been in close contact with a person  diagnosed with COVID-19 within the last 2 weeks and have fever, cough, and shortness of breath . IF YOU DO NOT MEET THESE CRITERIA, YOU ARE CONSIDERED LOW RISK FOR COVID-19.  What to do if you are HIGH RISK for COVID-19?  Marland Kitchen If you are having a medical emergency, call 911. . Seek medical care right away. Before you go to a doctor's office, urgent care or emergency department, call ahead and tell them about your recent travel, contact with someone diagnosed with COVID-19, and your symptoms. You should receive instructions from your physician's office regarding next steps of care.  . When you arrive at healthcare provider, tell the healthcare staff immediately you have returned from visiting [place], [place], [place], [place] or [place]; or traveled in the [place] to [place], [place], [place], or [place]; in the last two weeks or you have been in close contact with a person diagnosed with COVID-19 in the last 2 weeks.   . Tell the health care staff about your symptoms: fever, cough and shortness of breath. . After you have been seen by a medical provider, you will be either: o Tested for (COVID-19) and discharged home on quarantine except to seek medical care if symptoms worsen, and asked to  - Stay home and avoid contact with others until you get your results (4-5 days)  - Avoid travel on public transportation if possible (such as bus, train, or airplane) or o Sent to the Emergency Department by EMS for evaluation, COVID-19 testing, and possible admission depending on your condition and test results.  What to do if you are LOW RISK for COVID-19?  Reduce your risk of any infection by using the same precautions used for avoiding the common cold or flu:  Marland Kitchen Wash your hands often with soap and warm water for at least 20 seconds.  If soap and water are not readily available, use an alcohol-based hand sanitizer with at least 60% alcohol.  . If coughing or sneezing, cover your mouth and nose by  coughing or sneezing into the elbow areas of your shirt or coat, into a tissue or into your sleeve (not your hands). . Avoid shaking hands with others and consider head nods or verbal greetings only. . Avoid touching your eyes, nose, or mouth with unwashed hands.  . Avoid close contact with people who are sick. . Avoid places or events with large numbers of people in one location, like concerts or sporting events. . Carefully consider travel plans you have or are making. . If you are planning any travel outside or inside the [place], visit the CDC's Travelers' Health webpage for the latest health notices. . If you have some symptoms but not all symptoms, continue to monitor at home and seek medical attention if your symptoms worsen. . If you are having a medical emergency, call 911.   [place] / e-Visit: [email or web address]         [facility]: [facility]: [phone]                   [facility]: [phone]

## 2019-03-18 ENCOUNTER — Encounter: Payer: Self-pay | Admitting: Advanced Practice Midwife

## 2019-03-18 ENCOUNTER — Other Ambulatory Visit: Payer: Self-pay

## 2019-03-18 ENCOUNTER — Encounter: Payer: Self-pay | Admitting: *Deleted

## 2019-03-18 LAB — OBSTETRIC PANEL, INCLUDING HIV
Antibody Screen: NEGATIVE
Basophils Absolute: 0 10*3/uL (ref 0.0–0.2)
Basos: 0 %
EOS (ABSOLUTE): 0.1 10*3/uL (ref 0.0–0.4)
Eos: 1 %
HIV Screen 4th Generation wRfx: NONREACTIVE
Hematocrit: 41.3 % (ref 34.0–46.6)
Hemoglobin: 13.7 g/dL (ref 11.1–15.9)
Hepatitis B Surface Ag: NEGATIVE
Immature Grans (Abs): 0 10*3/uL (ref 0.0–0.1)
Immature Granulocytes: 1 %
Lymphocytes Absolute: 1.7 10*3/uL (ref 0.7–3.1)
Lymphs: 19 %
MCH: 29.7 pg (ref 26.6–33.0)
MCHC: 33.2 g/dL (ref 31.5–35.7)
MCV: 89 fL (ref 79–97)
Monocytes Absolute: 0.4 10*3/uL (ref 0.1–0.9)
Monocytes: 5 %
Neutrophils Absolute: 6.7 10*3/uL (ref 1.4–7.0)
Neutrophils: 74 %
Platelets: 149 10*3/uL — ABNORMAL LOW (ref 150–450)
RBC: 4.62 x10E6/uL (ref 3.77–5.28)
RDW: 12.7 % (ref 11.7–15.4)
RPR Ser Ql: NONREACTIVE
Rh Factor: POSITIVE
Rubella Antibodies, IGG: 4.33 index (ref >0.99)
WBC: 8.9 10*3/uL (ref 3.4–10.8)

## 2019-03-18 LAB — URINALYSIS, ROUTINE W REFLEX MICROSCOPIC
Bilirubin, UA: NEGATIVE
Glucose, UA: NEGATIVE
Ketones, UA: NEGATIVE
Leukocytes,UA: NEGATIVE
Nitrite, UA: NEGATIVE
Protein,UA: NEGATIVE
RBC, UA: NEGATIVE
Specific Gravity, UA: 1.022 (ref 1.005–1.030)
Urobilinogen, Ur: 0.2 mg/dL (ref 0.2–1.0)
pH, UA: 6.5 (ref 5.0–7.5)

## 2019-03-18 LAB — PMP SCREEN PROFILE (10S), URINE
Amphetamine Scrn, Ur: NEGATIVE ng/mL
BARBITURATE SCREEN URINE: NEGATIVE ng/mL
BENZODIAZEPINE SCREEN, URINE: NEGATIVE ng/mL
CANNABINOIDS UR QL SCN: NEGATIVE ng/mL
Cocaine (Metab) Scrn, Ur: NEGATIVE ng/mL
Creatinine(Crt), U: 124.7 mg/dL (ref 20.0–300.0)
Methadone Screen, Urine: NEGATIVE ng/mL
OXYCODONE+OXYMORPHONE UR QL SCN: NEGATIVE ng/mL
Opiate Scrn, Ur: NEGATIVE ng/mL
Ph of Urine: 6.5 (ref 4.5–8.9)
Phencyclidine Qn, Ur: NEGATIVE ng/mL
Propoxyphene Scrn, Ur: NEGATIVE ng/mL

## 2019-03-18 LAB — SICKLE CELL SCREEN: Sickle Cell Screen: NEGATIVE

## 2019-03-19 LAB — INTEGRATED 1
Crown Rump Length: 69.2 mm
Gest. Age on Collection Date: 13 weeks
Maternal Age at EDD: 35.9 yr
Nuchal Translucency (NT): 1.9 mm
Number of Fetuses: 1
PAPP-A Value: 476.6 ng/mL
Weight: 223 [lb_av]

## 2019-03-19 LAB — URINE CULTURE

## 2019-03-20 LAB — GC/CHLAMYDIA PROBE AMP
Chlamydia trachomatis, NAA: NEGATIVE
Neisseria Gonorrhoeae by PCR: NEGATIVE

## 2019-03-25 LAB — SPECIMEN STATUS REPORT

## 2019-03-30 LAB — COMPREHENSIVE METABOLIC PANEL
ALT: 11 IU/L (ref 0–32)
AST: 12 IU/L (ref 0–40)
Albumin/Globulin Ratio: 1.4 (ref 1.2–2.2)
Albumin: 4.1 g/dL (ref 3.8–4.8)
Alkaline Phosphatase: 71 IU/L (ref 39–117)
BUN/Creatinine Ratio: 14 (ref 9–23)
BUN: 7 mg/dL (ref 6–20)
Bilirubin Total: <0.2 mg/dL (ref 0.0–1.2)
CO2: 16 mmol/L — ABNORMAL LOW (ref 20–29)
Calcium: 9.7 mg/dL (ref 8.7–10.2)
Chloride: 101 mmol/L (ref 96–106)
Creatinine, Ser: 0.51 mg/dL — ABNORMAL LOW (ref 0.57–1.00)
GFR calc Af Amer: 144 mL/min/{1.73_m2} (ref >59)
GFR calc non Af Amer: 125 mL/min/{1.73_m2} (ref >59)
Globulin, Total: 2.9 g/dL (ref 1.5–4.5)
Glucose: 90 mg/dL (ref 65–99)
Potassium: 3.8 mmol/L (ref 3.5–5.2)
Sodium: 137 mmol/L (ref 134–144)
Total Protein: 7 g/dL (ref 6.0–8.5)

## 2019-03-30 LAB — SPECIMEN STATUS REPORT

## 2019-04-05 LAB — SPECIMEN STATUS REPORT

## 2019-04-12 LAB — PROTEIN / CREATININE RATIO, URINE
Creatinine, Urine: 128.4 mg/dL
Protein, Ur: 44.8 mg/dL
Protein/Creat Ratio: 349 mg/g creat — ABNORMAL HIGH (ref 0–200)

## 2019-04-12 LAB — SPECIMEN STATUS REPORT

## 2019-04-21 ENCOUNTER — Other Ambulatory Visit: Payer: Self-pay

## 2019-04-21 ENCOUNTER — Encounter: Payer: Self-pay | Admitting: Obstetrics and Gynecology

## 2019-04-21 ENCOUNTER — Encounter: Payer: Self-pay | Admitting: *Deleted

## 2019-04-22 ENCOUNTER — Ambulatory Visit (INDEPENDENT_AMBULATORY_CARE_PROVIDER_SITE_OTHER): Payer: BLUE CROSS/BLUE SHIELD | Admitting: Obstetrics & Gynecology

## 2019-04-22 ENCOUNTER — Ambulatory Visit (INDEPENDENT_AMBULATORY_CARE_PROVIDER_SITE_OTHER): Payer: BLUE CROSS/BLUE SHIELD

## 2019-04-22 ENCOUNTER — Other Ambulatory Visit: Payer: Self-pay

## 2019-04-22 VITALS — BP 124/77 | HR 84 | Wt 227.0 lb

## 2019-04-22 DIAGNOSIS — Z363 Encounter for antenatal screening for malformations: Secondary | ICD-10-CM | POA: Diagnosis not present

## 2019-04-22 DIAGNOSIS — Z3A18 18 weeks gestation of pregnancy: Secondary | ICD-10-CM

## 2019-04-22 DIAGNOSIS — Z331 Pregnant state, incidental: Secondary | ICD-10-CM | POA: Diagnosis not present

## 2019-04-22 DIAGNOSIS — Z3482 Encounter for supervision of other normal pregnancy, second trimester: Secondary | ICD-10-CM

## 2019-04-22 DIAGNOSIS — Z1379 Encounter for other screening for genetic and chromosomal anomalies: Secondary | ICD-10-CM | POA: Diagnosis not present

## 2019-04-22 DIAGNOSIS — Z1389 Encounter for screening for other disorder: Secondary | ICD-10-CM | POA: Diagnosis not present

## 2019-04-22 LAB — POCT URINALYSIS DIPSTICK OB
Blood, UA: NEGATIVE
Glucose, UA: NEGATIVE
Ketones, UA: NEGATIVE
Leukocytes, UA: NEGATIVE
Nitrite, UA: NEGATIVE
POC,PROTEIN,UA: NEGATIVE

## 2019-04-22 NOTE — Addendum Note (Signed)
Addended by: Christiana Pellant A on: 04/22/2019 02:59 PM   Modules accepted: Orders

## 2019-04-22 NOTE — Progress Notes (Signed)
[place] 18+3 wks,breech,cx 4.6 cm,posterior placenta gr 0,fhr 144 bpm,normal ovaries bilat,svp of fluid 4.7 cm,efw 223 g 25%,anatomy complete,no obvious abnormalities

## 2019-04-22 NOTE — Progress Notes (Signed)
   LOW-RISK PREGNANCY VISIT Patient name: Donna Kim MRN [id number]  Date of birth: Nov 13, 1981 Chief Complaint:   Routine Prenatal Visit  History of Present Illness:   Donna Kim is a 38 y.o. G58P3003 female at [redacted]w[redacted]d with an Estimated Date of Delivery: 09/20/19 being seen today for ongoing management of a low-risk pregnancy.  Today she reports no complaints. Contractions: Not present. Vag. Bleeding: None.  Movement: Absent. denies leaking of fluid. Review of Systems:   Pertinent items are noted in HPI Denies abnormal vaginal discharge w/ itching/odor/irritation, headaches, visual changes, shortness of breath, chest pain, abdominal pain, severe nausea/vomiting, or problems with urination or bowel movements unless otherwise stated above. Pertinent History Reviewed:  Reviewed past medical,surgical, social, obstetrical and family history.  Reviewed problem list, medications and allergies. Physical Assessment:   Vitals:   04/22/19 1425  BP: 124/77  Pulse: 84  Weight: 227 lb (103 kg)  Body mass index is 37.77 kg/m.        Physical Examination:   General appearance: Well appearing, and in no distress  Mental status: Alert, oriented to person, place, and time  Skin: Warm & dry  Cardiovascular: Normal heart rate noted  Respiratory: Normal respiratory effort, no distress  Abdomen: Soft, gravid, nontender  Pelvic: Cervical exam deferred         Extremities: Edema: None  Fetal Status: Fetal Heart Rate (bpm): 144 Fundal Height: 20 cm Movement: Absent    Results for orders placed or performed in visit on 04/22/19 (from the past 24 hour(s))  POC Urinalysis Dipstick OB   Collection Time: 04/22/19  2:14 PM  Result Value Ref Range   Color, UA     Clarity, UA     Glucose, UA Negative Negative   Bilirubin, UA     Ketones, UA neg    Spec Grav, UA     Blood, UA neg    pH, UA     POC,PROTEIN,UA Negative Negative, Trace, Small (1+), Moderate (2+), Large (3+), 4+   Urobilinogen, UA      Nitrite, UA neg    Leukocytes, UA Negative Negative   Appearance     Odor      Assessment & Plan:  1) Low-risk pregnancy G4P3003 at [redacted]w[redacted]d with an Estimated Date of Delivery: 09/20/19   2) sonogram is normal,    Meds: No orders of the defined types were placed in this encounter.  Labs/procedures today:   Plan:  Continue routine obstetrical care   Reviewed:  labor symptoms and general obstetric precautions including but not limited to vaginal bleeding, contractions, leaking of fluid and fetal movement were reviewed in detail with the patient.  All questions were answered  Follow-up: Return in about 4 weeks (around 05/20/2019) for webex visit, LROB.  Orders Placed This Encounter  Procedures  . Protein / creatinine ratio, urine  . POC Urinalysis Dipstick OB   Florian Buff  04/22/2019 2:52 PM

## 2019-04-23 LAB — PROTEIN / CREATININE RATIO, URINE
Creatinine, Urine: 59.7 mg/dL
Protein, Ur: 4.5 mg/dL
Protein/Creat Ratio: 75 mg/g creat (ref 0–200)

## 2019-04-24 LAB — INTEGRATED 2
AFP MoM: 0.95
Alpha-Fetoprotein: 28.4 ng/mL
Crown Rump Length: 69.2 mm
DIA MoM: 0.59
DIA Value: 79 pg/mL
Estriol, Unconjugated: 1.59 ng/mL
Gest. Age on Collection Date: 13 weeks
Gestational Age: 18.1 weeks
Maternal Age at EDD: 35.9 yr
Nuchal Translucency (NT): 1.9 mm
Nuchal Translucency MoM: 1.21
Number of Fetuses: 1
PAPP-A MoM: 0.68
PAPP-A Value: 476.6 ng/mL
Test Results:: NEGATIVE
Weight: 223 [lb_av]
Weight: 223 [lb_av]
hCG MoM: 0.44
hCG Value: 8.4 IU/mL
uE3 MoM: 1.28

## 2019-05-17 ENCOUNTER — Encounter: Payer: Self-pay | Admitting: *Deleted

## 2019-05-18 ENCOUNTER — Encounter: Payer: Self-pay | Admitting: Women's Health

## 2019-05-18 ENCOUNTER — Ambulatory Visit (INDEPENDENT_AMBULATORY_CARE_PROVIDER_SITE_OTHER): Payer: BC Managed Care – PPO | Admitting: Women's Health

## 2019-05-18 ENCOUNTER — Other Ambulatory Visit: Payer: Self-pay

## 2019-05-18 VITALS — BP 116/74 | HR 86 | Wt 225.0 lb

## 2019-05-18 DIAGNOSIS — Z8679 Personal history of other diseases of the circulatory system: Secondary | ICD-10-CM

## 2019-05-18 DIAGNOSIS — Z3A22 22 weeks gestation of pregnancy: Secondary | ICD-10-CM

## 2019-05-18 DIAGNOSIS — Z3482 Encounter for supervision of other normal pregnancy, second trimester: Secondary | ICD-10-CM

## 2019-05-18 DIAGNOSIS — Z8759 Personal history of other complications of pregnancy, childbirth and the puerperium: Secondary | ICD-10-CM

## 2019-05-18 NOTE — Patient Instructions (Signed)
Donna Kim, I greatly value your feedback.  If you receive a survey following your visit with [place] today, we appreciate you taking the time to fill it out.  Thanks, [name], CNM, WHNP-BC   You will have your sugar test next visit.  Please do not eat or drink anything after midnight the night before you come, not even water.  You will be here for at least two hours.     [place]!!! It is now [place] at [facility] ([place], [place] [address]) Entrance located off of [address] parking   Home Blood Pressure Monitoring for Patients   Your provider has recommended that you check your blood pressure (BP) at least once a week at home. If you do not have a blood pressure cuff at home, one will be provided for you. Contact your provider if you have not received your monitor within 1 week.   Helpful Tips for Accurate Home Blood Pressure Checks  . Don't smoke, exercise, or drink caffeine 30 minutes before checking your BP . Use the restroom before checking your BP (a full bladder can raise your pressure) . Relax in a comfortable upright chair . Feet on the ground . Left arm resting comfortably on a flat surface at the level of your heart . Legs uncrossed . Back supported . Sit quietly and don't talk . Place the cuff on your bare arm . Adjust snuggly, so that only two fingertips can fit between your skin and the top of the cuff . Check 2 readings separated by at least one minute . Keep a log of your BP readings . For a visual, please reference this diagram: http://ccnc.care/bpdiagram  Provider Name: [facility]     Phone: [phone]  Zone 1: ALL CLEAR  Continue to monitor your symptoms:  . BP reading is less than 140 (top number) or less than 90 (bottom number)  . No right upper stomach pain . No headaches or seeing spots . No feeling nauseated or throwing up . No swelling in face and hands  Zone 2:  CAUTION Call your doctor's office for any of the following:  . BP reading is greater than 140 (top number) or greater than 90 (bottom number)  . Stomach pain under your ribs in the middle or right side . Headaches or seeing spots . Feeling nauseated or throwing up . Swelling in face and hands  Zone 3: EMERGENCY  Seek immediate medical care if you have any of the following:  . BP reading is greater than160 (top number) or greater than 110 (bottom number) . Severe headaches not improving with Tylenol . Serious difficulty catching your breath . Any worsening symptoms from Zone 2   Call the office [phone]) or go to [facility] if:  You begin to have strong, frequent contractions  Your water breaks.  Sometimes it is a big gush of fluid, sometimes it is just a trickle that keeps getting your panties wet or running down your legs  You have vaginal bleeding.  It is normal to have a small amount of spotting if your cervix was checked.   You don't feel your baby moving like normal.  If you don't, get you something to eat and drink and lay down and focus on feeling your baby move.   If your baby is still not moving like normal, you should call the office or go to [facility].  Second Trimester of Pregnancy The second trimester is from week 13 through week 28, months 4 through 6. The second trimester is often a time when you feel your best. Your body has also adjusted to being pregnant, and you begin to feel better physically. Usually, morning sickness has lessened or quit completely, you may have more energy, and you may have an increase in appetite. The second trimester is also a time when the fetus is growing rapidly. At the end of the sixth month, the fetus is about 9 inches long and weighs about 1 pounds. You will likely begin to feel the baby move (quickening) between 18 and 20 weeks of the pregnancy. BODY CHANGES Your body goes through many changes during pregnancy. The changes  vary from woman to woman.   Your weight will continue to increase. You will notice your lower abdomen bulging out.  You may begin to get stretch marks on your hips, abdomen, and breasts.  You may develop headaches that can be relieved by medicines approved by your health care provider.  You may urinate more often because the fetus is pressing on your bladder.  You may develop or continue to have heartburn as a result of your pregnancy.  You may develop constipation because certain hormones are causing the muscles that push waste through your intestines to slow down.  You may develop hemorrhoids or swollen, bulging veins (varicose veins).  You may have back pain because of the weight gain and pregnancy hormones relaxing your joints between the bones in your pelvis and as a result of a shift in weight and the muscles that support your balance.  Your breasts will continue to grow and be tender.  Your gums may bleed and may be sensitive to brushing and flossing.  Dark spots or blotches (chloasma, mask of pregnancy) may develop on your face. This will likely fade after the baby is born.  A dark line from your belly button to the pubic area (linea nigra) may appear. This will likely fade after the baby is born.  You may have changes in your hair. These can include thickening of your hair, rapid growth, and changes in texture. Some women also have hair loss during or after pregnancy, or hair that feels dry or thin. Your hair will most likely return to normal after your baby is born. WHAT TO EXPECT AT YOUR PRENATAL VISITS During a routine prenatal visit:  You will be weighed to make sure you and the fetus are growing normally.  Your blood pressure will be taken.  Your abdomen will be measured to track your baby's growth.  The fetal heartbeat will be listened to.  Any test results from the previous visit will be discussed. Your health care provider may ask you:  How you are  feeling.  If you are feeling the baby move.  If you have had any abnormal symptoms, such as leaking fluid, bleeding, severe headaches, or abdominal cramping.  If you have any questions. Other tests that may be performed during your second trimester include:  Blood tests that check for:  Low iron levels (anemia).  Gestational diabetes (between 24 and 28 weeks).  Rh antibodies.  Urine tests to check for infections, diabetes, or protein in the urine.  An ultrasound to confirm the proper growth and development of the baby.  An amniocentesis to check for possible genetic problems.  Fetal screens for spina bifida and Down syndrome. HOME CARE INSTRUCTIONS   Avoid all smoking, herbs, alcohol, and  unprescribed drugs. These chemicals affect the formation and growth of the baby.  Follow your health care provider's instructions regarding medicine use. There are medicines that are either safe or unsafe to take during pregnancy.  Exercise only as directed by your health care provider. Experiencing uterine cramps is a good sign to stop exercising.  Continue to eat regular, healthy meals.  Wear a good support bra for breast tenderness.  Do not use hot tubs, steam rooms, or saunas.  Wear your seat belt at all times when driving.  Avoid raw meat, uncooked cheese, cat litter boxes, and soil used by cats. These carry germs that can cause birth defects in the baby.  Take your prenatal vitamins.  Try taking a stool softener (if your health care provider approves) if you develop constipation. Eat more high-fiber foods, such as fresh vegetables or fruit and whole grains. Drink plenty of fluids to keep your urine clear or pale yellow.  Take warm sitz baths to soothe any pain or discomfort caused by hemorrhoids. Use hemorrhoid cream if your health care provider approves.  If you develop varicose veins, wear support hose. Elevate your feet for 15 minutes, 3-4 times a day. Limit salt in your  diet.  Avoid heavy lifting, wear low heel shoes, and practice good posture.  Rest with your legs elevated if you have leg cramps or low back pain.  Visit your dentist if you have not gone yet during your pregnancy. Use a soft toothbrush to brush your teeth and be gentle when you floss.  A sexual relationship may be continued unless your health care provider directs you otherwise.  Continue to go to all your prenatal visits as directed by your health care provider. SEEK MEDICAL CARE IF:   You have dizziness.  You have mild pelvic cramps, pelvic pressure, or nagging pain in the abdominal area.  You have persistent nausea, vomiting, or diarrhea.  You have a bad smelling vaginal discharge.  You have pain with urination. SEEK IMMEDIATE MEDICAL CARE IF:   You have a fever.  You are leaking fluid from your vagina.  You have spotting or bleeding from your vagina.  You have severe abdominal cramping or pain.  You have rapid weight gain or loss.  You have shortness of breath with chest pain.  You notice sudden or extreme swelling of your face, hands, ankles, feet, or legs.  You have not felt your baby move in over an hour.  You have severe headaches that do not go away with medicine.  You have vision changes. Document Released: 11/12/2001 Document Revised: 11/23/2013 Document Reviewed: 01/19/2013 [facility] Patient Information 2015 [place], [place]. This information is not intended to replace advice given to you by your health care provider. Make sure you discuss any questions you have with your health care provider.

## 2019-05-18 NOTE — Progress Notes (Signed)
   [facility] VIRTUAL OBSTETRICS VISIT ENCOUNTER NOTE Patient name: Donna Kim MRN [id number]  Date of birth: 09-Jan-1981  I connected with patient on 05/18/19 at  3:15 PM EDT by [facility] and verified that I am speaking with the correct person using two identifiers. Due to COVID-19 recommendations, pt is not currently in our office.    I discussed the limitations, risks, security and privacy concerns of performing an evaluation and management service by telephone and the availability of in person appointments. I also discussed with the patient that there may be a patient responsible charge related to this service. The patient expressed understanding and agreed to proceed.  Chief Complaint:   Routine Prenatal Visit  History of Present Illness:   Donna Kim is a 38 y.o. G56P3003 female at [redacted]w[redacted]d with an Estimated Date of Delivery: 09/20/19 being evaluated today for ongoing management of a low-risk pregnancy.  Today she reports no complaints. Contractions: Not present.  .  Movement: Present. denies leaking of fluid. Review of Systems:   Pertinent items are noted in HPI Denies abnormal vaginal discharge w/ itching/odor/irritation, headaches, visual changes, shortness of breath, chest pain, abdominal pain, severe nausea/vomiting, or problems with urination or bowel movements unless otherwise stated above. Pertinent History Reviewed:  Reviewed past medical,surgical, social, obstetrical and family history.  Reviewed problem list, medications and allergies. Physical Assessment:   Vitals:   05/18/19 1621  BP: 116/74  Pulse: 86  Weight: 225 lb (102.1 kg)  Body mass index is 37.44 kg/m.        Physical Examination:   General:  Alert, oriented and cooperative.   Mental Status: Normal mood and affect perceived. Normal judgment and thought content.  Rest of physical exam deferred due to type of encounter  No results found for this or any previous visit (from the past 24 hour(s)).  Assessment &  Plan:  1) Pregnancy G4P3003 at [redacted]w[redacted]d with an Estimated Date of Delivery: 09/20/19   2) H/O PPHTN, continue ASA  3) H/O ICP   Meds: No orders of the defined types were placed in this encounter.   Labs/procedures today: none  Plan:  Continue routine obstetrical care. Has BP cuff. Check bp weekly, let us know if >140/90.   Reviewed: Preterm labor symptoms and general obstetric precautions including but not limited to vaginal bleeding, contractions, leaking of fluid and fetal movement were reviewed in detail with the patient. The patient was advised to call back or seek an in-person office evaluation/go to [facility] for any urgent or concerning symptoms. All questions were answered. Please refer to After Visit Summary for other counseling recommendations.   I provided 10 minutes of non-face-to-face time during this encounter.  Follow-up: Return in about 5 weeks (around 06/21/2019) for [place] in office, PN2.  No orders of the defined types were placed in this encounter.  [place], [facility] 05/18/2019 4:40 PM

## 2019-05-24 ENCOUNTER — Other Ambulatory Visit: Payer: Self-pay | Admitting: Women's Health

## 2019-05-24 DIAGNOSIS — Z3482 Encounter for supervision of other normal pregnancy, second trimester: Secondary | ICD-10-CM

## 2019-05-24 DIAGNOSIS — L299 Pruritus, unspecified: Secondary | ICD-10-CM

## 2019-05-25 DIAGNOSIS — Z3482 Encounter for supervision of other normal pregnancy, second trimester: Secondary | ICD-10-CM | POA: Diagnosis not present

## 2019-05-25 DIAGNOSIS — L299 Pruritus, unspecified: Secondary | ICD-10-CM | POA: Diagnosis not present

## 2019-05-27 LAB — COMPREHENSIVE METABOLIC PANEL
ALT: 13 IU/L (ref 0–32)
AST: 10 IU/L (ref 0–40)
Albumin/Globulin Ratio: 1.5 (ref 1.2–2.2)
Albumin: 3.8 g/dL (ref 3.8–4.8)
Alkaline Phosphatase: 78 IU/L (ref 39–117)
BUN/Creatinine Ratio: 12 (ref 9–23)
BUN: 6 mg/dL (ref 6–20)
Bilirubin Total: 0.3 mg/dL (ref 0.0–1.2)
CO2: 18 mmol/L — ABNORMAL LOW (ref 20–29)
Calcium: 8.9 mg/dL (ref 8.7–10.2)
Chloride: 104 mmol/L (ref 96–106)
Creatinine, Ser: 0.5 mg/dL — ABNORMAL LOW (ref 0.57–1.00)
GFR calc Af Amer: 145 mL/min/{1.73_m2} (ref >59)
GFR calc non Af Amer: 126 mL/min/{1.73_m2} (ref >59)
Globulin, Total: 2.6 g/dL (ref 1.5–4.5)
Glucose: 91 mg/dL (ref 65–99)
Potassium: 3.9 mmol/L (ref 3.5–5.2)
Sodium: 138 mmol/L (ref 134–144)
Total Protein: 6.4 g/dL (ref 6.0–8.5)

## 2019-05-27 LAB — BILE ACIDS, TOTAL: Bile Acids Total: 3.1 umol/L (ref 0.0–10.0)

## 2019-06-21 ENCOUNTER — Encounter: Payer: Self-pay | Admitting: Women's Health

## 2019-06-21 ENCOUNTER — Other Ambulatory Visit: Payer: Self-pay

## 2019-06-21 ENCOUNTER — Other Ambulatory Visit: Payer: BC Managed Care – PPO

## 2019-06-21 ENCOUNTER — Ambulatory Visit (INDEPENDENT_AMBULATORY_CARE_PROVIDER_SITE_OTHER): Payer: BC Managed Care – PPO | Admitting: Women's Health

## 2019-06-21 VITALS — BP 131/80 | HR 98 | Wt 237.0 lb

## 2019-06-21 DIAGNOSIS — Z3A27 27 weeks gestation of pregnancy: Secondary | ICD-10-CM

## 2019-06-21 DIAGNOSIS — Z23 Encounter for immunization: Secondary | ICD-10-CM | POA: Diagnosis not present

## 2019-06-21 DIAGNOSIS — Z3482 Encounter for supervision of other normal pregnancy, second trimester: Secondary | ICD-10-CM

## 2019-06-21 DIAGNOSIS — Z1389 Encounter for screening for other disorder: Secondary | ICD-10-CM

## 2019-06-21 DIAGNOSIS — Z331 Pregnant state, incidental: Secondary | ICD-10-CM

## 2019-06-21 LAB — POCT URINALYSIS DIPSTICK OB
Blood, UA: NEGATIVE
Glucose, UA: NEGATIVE
Ketones, UA: NEGATIVE
Leukocytes, UA: NEGATIVE
Nitrite, UA: NEGATIVE
POC,PROTEIN,UA: NEGATIVE

## 2019-06-21 NOTE — Progress Notes (Signed)
   LOW-RISK PREGNANCY VISIT Patient name: Donna Kim MRN [id number]  Date of birth: 1981/03/06 Chief Complaint:   Routine Prenatal Visit (PN2 today)  History of Present Illness:   Donna Kim is a 38 y.o. G60P3003 female at [redacted]w[redacted]d with an Estimated Date of Delivery: 09/20/19 being seen today for ongoing management of a low-risk pregnancy.  Today she reports no complaints. Contractions: Not present. Vag. Bleeding: None.  Movement: Present. denies leaking of fluid. Review of Systems:   Pertinent items are noted in HPI Denies abnormal vaginal discharge w/ itching/odor/irritation, headaches, visual changes, shortness of breath, chest pain, abdominal pain, severe nausea/vomiting, or problems with urination or bowel movements unless otherwise stated above. Pertinent History Reviewed:  Reviewed past medical,surgical, social, obstetrical and family history.  Reviewed problem list, medications and allergies. Physical Assessment:   Vitals:   06/21/19 0943  BP: 131/80  Pulse: 98  Weight: 237 lb (107.5 kg)  Body mass index is 39.44 kg/m.        Physical Examination:   General appearance: Well appearing, and in no distress  Mental status: Alert, oriented to person, place, and time  Skin: Warm & dry  Cardiovascular: Normal heart rate noted  Respiratory: Normal respiratory effort, no distress  Abdomen: Soft, gravid, nontender  Pelvic: Cervical exam deferred         Extremities: Edema: None  Fetal Status: Fetal Heart Rate (bpm): 134 Fundal Height: 28 cm Movement: Present    Results for orders placed or performed in visit on 06/21/19 (from the past 24 hour(s))  POC Urinalysis Dipstick OB   Collection Time: 06/21/19  9:44 AM  Result Value Ref Range   Color, UA     Clarity, UA     Glucose, UA Negative Negative   Bilirubin, UA     Ketones, UA neg    Spec Grav, UA     Blood, UA neg    pH, UA     POC,PROTEIN,UA Negative Negative, Trace, Small (1+), Moderate (2+), Large (3+), 4+   Urobilinogen, UA     Nitrite, UA neg    Leukocytes, UA Negative Negative   Appearance     Odor      Assessment & Plan:  1) Low-risk pregnancy G4P3003 at [redacted]w[redacted]d with an Estimated Date of Delivery: 09/20/19   Meds: No orders of the defined types were placed in this encounter.  Labs/procedures today: pn2, tdap  Plan:  Continue routine obstetrical care   Reviewed: Preterm labor symptoms and general obstetric precautions including but not limited to vaginal bleeding, contractions, leaking of fluid and fetal movement were reviewed in detail with the patient.  All questions were answered. Has home bp cuff. Check bp weekly, let us know if >140/90.   Follow-up: Return in about 4 weeks (around 07/19/2019) for [place], [place].  Orders Placed This Encounter  Procedures  . Tdap vaccine greater than or equal to 7yo IM  . POC Urinalysis Dipstick OB   Roma Schanz CNM, [facility] 06/21/2019 10:44 AM

## 2019-06-21 NOTE — Patient Instructions (Signed)
Donna Kim, I greatly value your feedback.  If you receive a survey following your visit with [place] today, we appreciate you taking the time to fill it out.  Thanks, [name], CNM, [facility]  [place]!!! It is now [place] at [facility] ([place], [place] [address]) Entrance located off of [address] parking    Go to [email or web address] to register for FREE online childbirth classes   Call the office [phone]) or go to [facility] if:  You begin to have strong, frequent contractions  Your water breaks.  Sometimes it is a big gush of fluid, sometimes it is just a trickle that keeps getting your panties wet or running down your legs  You have vaginal bleeding.  It is normal to have a small amount of spotting if your cervix was checked.   You don't feel your baby moving like normal.  If you don't, get you something to eat and drink and lay down and focus on feeling your baby move.  You should feel at least 10 movements in 2 hours.  If you don't, you should call the office or go to [facility].    Tdap Vaccine  It is recommended that you get the Tdap vaccine during the third trimester of EACH pregnancy to help protect your baby from getting pertussis (whooping cough)  27-36 weeks is the BEST time to do this so that you can pass the protection on to your baby. During pregnancy is better than after pregnancy, but if you are unable to get it during pregnancy it will be offered at the hospital.   You can get this vaccine with [place], at the health department, your family doctor, or some local pharmacies  Everyone who will be around your baby should also be up-to-date on their vaccines before the baby comes. Adults (who are not pregnant) only need 1 dose of Tdap during adulthood.   [place] Pediatricians/Family Doctors:  [facility] [phone]                  [place] [phone] (usually not accepting new patients unless you have family there already, you are always welcome to call and ask)       [facility] Department [phone]       [facility] Pediatricians/Family Doctors:   [facility]: [phone]  [facility]: [phone]  [facility]: [place] Doctors:   [facility]: [facility]: [place]:  [place]: [phone]   Home Blood Pressure Monitoring for Patients   Your provider has recommended that you check your blood pressure (BP) at least once a week at home. If you do not have a blood pressure cuff at home, one will be provided for you. Contact your provider if you have not received your monitor within 1 week.   Helpful Tips for Accurate Home Blood Pressure Checks  . Don't smoke, exercise, or drink caffeine 30 minutes before checking your BP . Use the restroom before checking your BP (a full bladder can raise your pressure) . Relax in a comfortable upright chair . Feet on the ground . Left arm resting comfortably on a flat surface at the level of your heart . Legs uncrossed . Back supported . Sit quietly and don't talk .  Place the cuff on your bare arm . Adjust snuggly, so that only two fingertips can fit between your skin and the top of the cuff . Check 2 readings separated by at least one minute . Keep a log of your BP readings . For a visual, please reference this diagram: http://ccnc.care/bpdiagram  Provider Name: [facility]     Phone: [phone]  Zone 1: ALL CLEAR  Continue to monitor your symptoms:  . BP reading is less than 140 (top number) or less than 90 (bottom number)  . No right upper stomach pain . No headaches or seeing spots . No feeling nauseated or throwing up . No swelling in face and  hands  Zone 2: CAUTION Call your doctor's office for any of the following:  . BP reading is greater than 140 (top number) or greater than 90 (bottom number)  . Stomach pain under your ribs in the middle or right side . Headaches or seeing spots . Feeling nauseated or throwing up . Swelling in face and hands  Zone 3: EMERGENCY  Seek immediate medical care if you have any of the following:  . BP reading is greater than160 (top number) or greater than 110 (bottom number) . Severe headaches not improving with Tylenol . Serious difficulty catching your breath . Any worsening symptoms from Zone 2   Third Trimester of Pregnancy The third trimester is from week 29 through week 42, months 7 through 9. The third trimester is a time when the fetus is growing rapidly. At the end of the ninth month, the fetus is about 20 inches in length and weighs 6-10 pounds.  BODY CHANGES Your body goes through many changes during pregnancy. The changes vary from woman to woman.   Your weight will continue to increase. You can expect to gain 25-35 pounds (11-16 kg) by the end of the pregnancy.  You may begin to get stretch marks on your hips, abdomen, and breasts.  You may urinate more often because the fetus is moving lower into your pelvis and pressing on your bladder.  You may develop or continue to have heartburn as a result of your pregnancy.  You may develop constipation because certain hormones are causing the muscles that push waste through your intestines to slow down.  You may develop hemorrhoids or swollen, bulging veins (varicose veins).  You may have pelvic pain because of the weight gain and pregnancy hormones relaxing your joints between the bones in your pelvis. Backaches may result from overexertion of the muscles supporting your posture.  You may have changes in your hair. These can include thickening of your hair, rapid growth, and changes in texture. Some women also have hair loss during  or after pregnancy, or hair that feels dry or thin. Your hair will most likely return to normal after your baby is born.  Your breasts will continue to grow and be tender. A yellow discharge may leak from your breasts called colostrum.  Your belly button may stick out.  You may feel short of breath because of your expanding uterus.  You may notice the fetus "dropping," or moving lower in your abdomen.  You may have a bloody mucus discharge. This usually occurs a few days to a week before labor begins.  Your cervix becomes thin and soft (effaced) near your due date. WHAT TO EXPECT AT YOUR PRENATAL EXAMS  You will have prenatal exams every 2 weeks until week 36. Then, you will have weekly prenatal exams. During  a routine prenatal visit:  You will be weighed to make sure you and the fetus are growing normally.  Your blood pressure is taken.  Your abdomen will be measured to track your baby's growth.  The fetal heartbeat will be listened to.  Any test results from the previous visit will be discussed.  You may have a cervical check near your due date to see if you have effaced. At around 36 weeks, your caregiver will check your cervix. At the same time, your caregiver will also perform a test on the secretions of the vaginal tissue. This test is to determine if a type of bacteria, Group B streptococcus, is present. Your caregiver will explain this further. Your caregiver may ask you:  What your birth plan is.  How you are feeling.  If you are feeling the baby move.  If you have had any abnormal symptoms, such as leaking fluid, bleeding, severe headaches, or abdominal cramping.  If you have any questions. Other tests or screenings that may be performed during your third trimester include:  Blood tests that check for low iron levels (anemia).  Fetal testing to check the health, activity level, and growth of the fetus. Testing is done if you have certain medical conditions or if  there are problems during the pregnancy. FALSE LABOR You may feel small, irregular contractions that eventually go away. These are called Braxton Hicks contractions, or false labor. Contractions may last for hours, days, or even weeks before true labor sets in. If contractions come at regular intervals, intensify, or become painful, it is best to be seen by your caregiver.  SIGNS OF LABOR   Menstrual-like cramps.  Contractions that are 5 minutes apart or less.  Contractions that start on the top of the uterus and spread down to the lower abdomen and back.  A sense of increased pelvic pressure or back pain.  A watery or bloody mucus discharge that comes from the vagina. If you have any of these signs before the 37th week of pregnancy, call your caregiver right away. You need to go to the hospital to get checked immediately. HOME CARE INSTRUCTIONS   Avoid all smoking, herbs, alcohol, and unprescribed drugs. These chemicals affect the formation and growth of the baby.  Follow your caregiver's instructions regarding medicine use. There are medicines that are either safe or unsafe to take during pregnancy.  Exercise only as directed by your caregiver. Experiencing uterine cramps is a good sign to stop exercising.  Continue to eat regular, healthy meals.  Wear a good support bra for breast tenderness.  Do not use hot tubs, steam rooms, or saunas.  Wear your seat belt at all times when driving.  Avoid raw meat, uncooked cheese, cat litter boxes, and soil used by cats. These carry germs that can cause birth defects in the baby.  Take your prenatal vitamins.  Try taking a stool softener (if your caregiver approves) if you develop constipation. Eat more high-fiber foods, such as fresh vegetables or fruit and whole grains. Drink plenty of fluids to keep your urine clear or pale yellow.  Take warm sitz baths to soothe any pain or discomfort caused by hemorrhoids. Use hemorrhoid cream if your  caregiver approves.  If you develop varicose veins, wear support hose. Elevate your feet for 15 minutes, 3-4 times a day. Limit salt in your diet.  Avoid heavy lifting, wear low heal shoes, and practice good posture.  Rest a lot with your legs elevated if you  have leg cramps or low back pain.  Visit your dentist if you have not gone during your pregnancy. Use a soft toothbrush to brush your teeth and be gentle when you floss.  A sexual relationship may be continued unless your caregiver directs you otherwise.  Do not travel far distances unless it is absolutely necessary and only with the approval of your caregiver.  Take prenatal classes to understand, practice, and ask questions about the labor and delivery.  Make a trial run to the hospital.  Pack your hospital bag.  Prepare the baby's nursery.  Continue to go to all your prenatal visits as directed by your caregiver. SEEK MEDICAL CARE IF:  You are unsure if you are in labor or if your water has broken.  You have dizziness.  You have mild pelvic cramps, pelvic pressure, or nagging pain in your abdominal area.  You have persistent nausea, vomiting, or diarrhea.  You have a bad smelling vaginal discharge.  You have pain with urination. SEEK IMMEDIATE MEDICAL CARE IF:   You have a fever.  You are leaking fluid from your vagina.  You have spotting or bleeding from your vagina.  You have severe abdominal cramping or pain.  You have rapid weight loss or gain.  You have shortness of breath with chest pain.  You notice sudden or extreme swelling of your face, hands, ankles, feet, or legs.  You have not felt your baby move in over an hour.  You have severe headaches that do not go away with medicine.  You have vision changes. Document Released: 11/12/2001 Document Revised: 11/23/2013 Document Reviewed: 01/19/2013 [facility] Patient Information 2015 [place], [place]. This information is not intended to replace advice  given to you by your health care provider. Make sure you discuss any questions you have with your health care provider.

## 2019-06-22 ENCOUNTER — Encounter: Payer: Self-pay | Admitting: Women's Health

## 2019-06-22 DIAGNOSIS — D696 Thrombocytopenia, unspecified: Secondary | ICD-10-CM | POA: Insufficient documentation

## 2019-06-22 LAB — GLUCOSE TOLERANCE, 2 HOURS W/ 1HR
Glucose, 1 hour: 114 mg/dL (ref 65–179)
Glucose, 2 hour: 126 mg/dL (ref 65–152)
Glucose, Fasting: 81 mg/dL (ref 65–91)

## 2019-06-22 LAB — CBC
Hematocrit: 37.5 % (ref 34.0–46.6)
Hemoglobin: 12.6 g/dL (ref 11.1–15.9)
MCH: 30.3 pg (ref 26.6–33.0)
MCHC: 33.6 g/dL (ref 31.5–35.7)
MCV: 90 fL (ref 79–97)
Platelets: 124 10*3/uL — ABNORMAL LOW (ref 150–450)
RBC: 4.16 x10E6/uL (ref 3.77–5.28)
RDW: 13.2 % (ref 11.7–15.4)
WBC: 11.4 10*3/uL — ABNORMAL HIGH (ref 3.4–10.8)

## 2019-06-22 LAB — HIV ANTIBODY (ROUTINE TESTING W REFLEX): HIV Screen 4th Generation wRfx: NONREACTIVE

## 2019-06-22 LAB — ANTIBODY SCREEN: Antibody Screen: NEGATIVE

## 2019-06-22 LAB — RPR: RPR Ser Ql: NONREACTIVE

## 2019-06-25 ENCOUNTER — Telehealth: Payer: Self-pay | Admitting: Women's Health

## 2019-06-25 NOTE — Telephone Encounter (Signed)
Pt states that she has been feeling lightheaded the last few days and is wanting advice on what she can do for this and if there is something she can take.

## 2019-06-25 NOTE — Telephone Encounter (Signed)
Pt reports feeling lightheaded for the past few days. She is trying to drink plenty of water. No blurry vision or seeing spots. Asked patient to check her bp while on the phone with me. BP was 131/80. Advised if symptoms worsen or she develops a headache not relieved by tylenol to call us or go to ED over weekend. Advised increased fluids, moving from sitting to standing slowly, small frequent meals.

## 2019-07-15 ENCOUNTER — Encounter: Payer: Self-pay | Admitting: *Deleted

## 2019-07-19 ENCOUNTER — Other Ambulatory Visit: Payer: Self-pay

## 2019-07-19 ENCOUNTER — Ambulatory Visit: Payer: BC Managed Care – PPO | Admitting: *Deleted

## 2019-07-19 ENCOUNTER — Encounter: Payer: Self-pay | Admitting: Women's Health

## 2019-07-19 ENCOUNTER — Telehealth (INDEPENDENT_AMBULATORY_CARE_PROVIDER_SITE_OTHER): Payer: BC Managed Care – PPO | Admitting: Women's Health

## 2019-07-19 VITALS — BP 126/86 | HR 98 | Wt 236.6 lb

## 2019-07-19 VITALS — BP 117/71 | HR 96

## 2019-07-19 DIAGNOSIS — Z3483 Encounter for supervision of other normal pregnancy, third trimester: Secondary | ICD-10-CM | POA: Diagnosis not present

## 2019-07-19 DIAGNOSIS — Z3A31 31 weeks gestation of pregnancy: Secondary | ICD-10-CM

## 2019-07-19 NOTE — Patient Instructions (Signed)
Donna Kim, I greatly value your feedback.  If you receive a survey following your visit with [place] today, we appreciate you taking the time to fill it out.  Thanks, [name], CNM, [facility]  [place]!!! It is now [place] at [facility] ([place], [place] [address]) Entrance located off of [address] parking   Go to [email or web address] to register for FREE online childbirth classes    Call the office [phone]) or go to [facility] if:  You begin to have strong, frequent contractions  Your water breaks.  Sometimes it is a big gush of fluid, sometimes it is just a trickle that keeps getting your panties wet or running down your legs  You have vaginal bleeding.  It is normal to have a small amount of spotting if your cervix was checked.   You don't feel your baby moving like normal.  If you don't, get you something to eat and drink and lay down and focus on feeling your baby move.  You should feel at least 10 movements in 2 hours.  If you don't, you should call the office or go to [facility].   Call the office [phone]) or go to [facility] hospital for these signs of pre-eclampsia:  Severe headache that does not go away with Tylenol  Visual changes- seeing spots, double, blurred vision  Pain under your right breast or upper abdomen that does not go away with Tums or heartburn medicine  Nausea and/or vomiting  Severe swelling in your hands, feet, and face      Home Blood Pressure Monitoring for Patients   Your provider has recommended that you check your blood pressure (BP) at least once a week at home. If you do not have a blood pressure cuff at home, one will be provided for you. Contact your provider if you have not received your monitor within 1 week.   Helpful Tips for Accurate Home Blood Pressure Checks  . Don't smoke, exercise, or drink caffeine 30 minutes before checking your BP . Use the  restroom before checking your BP (a full bladder can raise your pressure) . Relax in a comfortable upright chair . Feet on the ground . Left arm resting comfortably on a flat surface at the level of your heart . Legs uncrossed . Back supported . Sit quietly and don't talk . Place the cuff on your bare arm . Adjust snuggly, so that only two fingertips can fit between your skin and the top of the cuff . Check 2 readings separated by at least one minute . Keep a log of your BP readings . For a visual, please reference this diagram: http://ccnc.care/bpdiagram  Provider Name: [facility]     Phone: [phone]  Zone 1: ALL CLEAR  Continue to monitor your symptoms:  . BP reading is less than 140 (top number) or less than 90 (bottom number)  . No right upper stomach pain . No headaches or seeing spots . No feeling nauseated or throwing up . No swelling in face and hands  Zone 2: CAUTION Call your doctor's office for any of the following:  . BP reading is greater than 140 (top number) or greater than 90 (bottom number)  . Stomach pain under your ribs in the middle or right side . Headaches or seeing spots . Feeling nauseated or throwing up . Swelling in face and hands  Zone 3: EMERGENCY  Seek immediate  medical care if you have any of the following:  . BP reading is greater than160 (top number) or greater than 110 (bottom number) . Severe headaches not improving with Tylenol . Serious difficulty catching your breath . Any worsening symptoms from Zone 2    Preeclampsia and Eclampsia Preeclampsia is a serious condition that may develop during pregnancy. This condition causes high blood pressure and increased protein in your urine along with other symptoms, such as headaches and vision changes. These symptoms may develop as the condition gets worse. Preeclampsia may occur at 20 weeks of pregnancy or later. Diagnosing and treating preeclampsia early is very important. If not  treated early, it can cause serious problems for you and your baby. One problem it can lead to is eclampsia. Eclampsia is a condition that causes muscle jerking or shaking (convulsions or seizures) and other serious problems for the mother. During pregnancy, delivering your baby may be the best treatment for preeclampsia or eclampsia. For most women, preeclampsia and eclampsia symptoms go away after giving birth. In rare cases, a woman may develop preeclampsia after giving birth (postpartum preeclampsia). This usually occurs within 48 hours after childbirth but may occur up to 6 weeks after giving birth. What are the causes? The cause of preeclampsia is not known. What increases the risk? The following risk factors make you more likely to develop preeclampsia:  Being pregnant for the first time.  Having had preeclampsia during a past pregnancy.  Having a family history of preeclampsia.  Having high blood pressure.  Being pregnant with more than one baby.  Being 41 or older.  Being African-American.  Having kidney disease or diabetes.  Having medical conditions such as lupus or blood diseases.  Being very overweight (obese). What are the signs or symptoms? The most common symptoms are:  Severe headaches.  Vision problems, such as blurred or double vision.  Abdominal pain, especially upper abdominal pain. Other symptoms that may develop as the condition gets worse include:  Sudden weight gain.  Sudden swelling of the hands, face, legs, and feet.  Severe nausea and vomiting.  Numbness in the face, arms, legs, and feet.  Dizziness.  Urinating less than usual.  Slurred speech.  Convulsions or seizures. How is this diagnosed? There are no screening tests for preeclampsia. Your health care provider will ask you about symptoms and check for signs of preeclampsia during your prenatal visits. You may also have tests that include:  Checking your blood pressure.  Urine  tests to check for protein. Your health care provider will check for this at every prenatal visit.  Blood tests.  Monitoring your baby's heart rate.  Ultrasound. How is this treated? You and your health care provider will determine the treatment approach that is best for you. Treatment may include:  Having more frequent prenatal exams to check for signs of preeclampsia, if you have an increased risk for preeclampsia.  Medicine to lower your blood pressure.  Staying in the hospital, if your condition is severe. There, treatment will focus on controlling your blood pressure and the amount of fluids in your body (fluid retention).  Taking medicine (magnesium sulfate) to prevent seizures. This may be given as an injection or through an IV.  Taking a low-dose aspirin during your pregnancy.  Delivering your baby early. You may have your labor started with medicine (induced), or you may have a cesarean delivery. Follow these instructions at home: Eating and drinking   Drink enough fluid to keep your urine pale  yellow.  Avoid caffeine. Lifestyle  Do not use any products that contain nicotine or tobacco, such as cigarettes and e-cigarettes. If you need help quitting, ask your health care provider.  Do not use alcohol or drugs.  Avoid stress as much as possible. Rest and get plenty of sleep. General instructions  Take over-the-counter and prescription medicines only as told by your health care provider.  When lying down, lie on your left side. This keeps pressure off your major blood vessels.  When sitting or lying down, raise (elevate) your feet. Try putting some pillows underneath your lower legs.  Exercise regularly. Ask your health care provider what kinds of exercise are best for you.  Keep all follow-up and prenatal visits as told by your health care provider. This is important. How is this prevented? There is no known way of preventing preeclampsia or eclampsia from  developing. However, to lower your risk of complications and detect problems early:  Get regular prenatal care. Your health care provider may be able to diagnose and treat the condition early.  Maintain a healthy weight. Ask your health care provider for help managing weight gain during pregnancy.  Work with your health care provider to manage any long-term (chronic) health conditions you have, such as diabetes or kidney problems.  You may have tests of your blood pressure and kidney function after giving birth.  Your health care provider may have you take low-dose aspirin during your next pregnancy. Contact a health care provider if:  You have symptoms that your health care provider told you may require more treatment or monitoring, such as: ? Headaches. ? Nausea or vomiting. ? Abdominal pain. ? Dizziness. ? Light-headedness. Get help right away if:  You have severe: ? Abdominal pain. ? Headaches that do not get better. ? Dizziness. ? Vision problems. ? Confusion. ? Nausea or vomiting.  You have any of the following: ? A seizure. ? Sudden, rapid weight gain. ? Sudden swelling in your hands, ankles, or face. ? Trouble moving any part of your body. ? Numbness in any part of your body. ? Trouble speaking. ? Abnormal bleeding.  You faint. Summary  Preeclampsia is a serious condition that may develop during pregnancy.  This condition causes high blood pressure and increased protein in your urine along with other symptoms, such as headaches and vision changes.  Diagnosing and treating preeclampsia early is very important. If not treated early, it can cause serious problems for you and your baby.  Get help right away if you have symptoms that your health care provider told you to watch for. This information is not intended to replace advice given to you by your health care provider. Make sure you discuss any questions you have with your health care provider. Document  Released: 11/15/2000 Document Revised: 07/21/2018 Document Reviewed: 06/24/2016 Elsevier Patient Education  2020 [employer].

## 2019-07-19 NOTE — Progress Notes (Signed)
   [facility] VIRTUAL OBSTETRICS VISIT ENCOUNTER NOTE Patient name: Donna Kim MRN [id number]  Date of birth: 12-07-1980  I connected with patient on 07/19/19 at  1:30 PM EDT by MyChart video  and verified that I am speaking with the correct person using two identifiers. Due to COVID-19 recommendations, pt is not currently in our office.    I discussed the limitations, risks, security and privacy concerns of performing an evaluation and management service by telephone and the availability of in person appointments. I also discussed with the patient that there may be a patient responsible charge related to this service. The patient expressed understanding and agreed to proceed.  Chief Complaint:   Routine Prenatal Visit  History of Present Illness:   Donna Kim is a 38 y.o. G51P3003 female at [redacted]w[redacted]d with an Estimated Date of Delivery: 09/20/19 being evaluated today for ongoing management of a low-risk pregnancy.  Today she reports bad 'ocular migraines' past 2 days, taking apap, doesn't really help. Aura w/ migraines. BPs have been normal at home. Just feels off. Brain has been foggy. Sometimes sob.  Contractions: Not present. Vag. Bleeding: None.  Movement: Present. denies leaking of fluid. Review of Systems:   Pertinent items are noted in HPI Denies abnormal vaginal discharge w/ itching/odor/irritation, headaches, visual changes, shortness of breath, chest pain, abdominal pain, severe nausea/vomiting, or problems with urination or bowel movements unless otherwise stated above. Pertinent History Reviewed:  Reviewed past medical,surgical, social, obstetrical and family history.  Reviewed problem list, medications and allergies. Physical Assessment:   Vitals:   07/19/19 1338  BP: 126/86  Pulse: 98  Weight: 236 lb 9.6 oz (107.3 kg)  Body mass index is 39.37 kg/m.        Physical Examination:   General:  Alert, oriented and cooperative.   Mental Status: Normal mood and affect  perceived. Normal judgment and thought content.  Rest of physical exam deferred due to type of encounter  No results found for this or any previous visit (from the past 24 hour(s)).  Assessment & Plan:  1) Pregnancy G4P3003 at [redacted]w[redacted]d with an Estimated Date of Delivery: 09/20/19   2) H/O PPHTN, bp normal today  3) Migraines w/ aura> apap not helping, bp normal, will bring in for bp check w/ [place]  4) Gestational thrombocytopenia> 124 @ 27wks, will repeat today   Meds: No orders of the defined types were placed in this encounter.   Labs/procedures today: none  Plan:  Continue routine obstetrical care.  Has home bp cuff.  Check bp weekly, let us know if >140/90.   Reviewed: Preterm labor symptoms and general obstetric precautions including but not limited to vaginal bleeding, contractions, leaking of fluid and fetal movement were reviewed in detail with the patient. The patient was advised to call back or seek an in-person office evaluation/go to MAU at [facility] for any urgent or concerning symptoms. All questions were answered. Please refer to After Visit Summary for other counseling recommendations.    I provided 15 minutes of non-face-to-face time during this encounter.  Follow-up: No follow-ups on file.  No orders of the defined types were placed in this encounter.  Goodnight, [facility] 07/19/2019 1:43 PM

## 2019-07-19 NOTE — Progress Notes (Unsigned)
Pt in for bp check. Kim gave verbal order for cbc and will send in fioricet.

## 2019-07-20 ENCOUNTER — Other Ambulatory Visit: Payer: Self-pay | Admitting: Women's Health

## 2019-07-20 ENCOUNTER — Telehealth: Payer: Self-pay | Admitting: Women's Health

## 2019-07-20 LAB — CBC
Hematocrit: 36.1 % (ref 34.0–46.6)
Hemoglobin: 12.6 g/dL (ref 11.1–15.9)
MCH: 30 pg (ref 26.6–33.0)
MCHC: 34.9 g/dL (ref 31.5–35.7)
MCV: 86 fL (ref 79–97)
Platelets: 127 10*3/uL — ABNORMAL LOW (ref 150–450)
RBC: 4.2 x10E6/uL (ref 3.77–5.28)
RDW: 13 % (ref 11.7–15.4)
WBC: 10.7 10*3/uL (ref 3.4–10.8)

## 2019-07-20 MED ORDER — BUTALBITAL-APAP-CAFFEINE 50-325-40 MG PO TABS: 1.0000 | ORAL_TABLET | ORAL | 0 refills | Status: DC | PRN

## 2019-07-20 NOTE — Telephone Encounter (Signed)
Patient called, stated she had a virtual visit yesterday and Maudie Mercury was supposed to send something in, but the pharmacy stated they do not have it.  [employer]  [phone]

## 2019-07-26 ENCOUNTER — Encounter (HOSPITAL_COMMUNITY): Payer: Self-pay

## 2019-07-26 ENCOUNTER — Emergency Department (HOSPITAL_COMMUNITY)
Admission: EM | Admit: 2019-07-26 | Discharge: 2019-07-27 | Disposition: A | Discharge status: Home / Self Care | Payer: BC Managed Care – PPO | Attending: Emergency Medicine | Admitting: Emergency Medicine

## 2019-07-26 ENCOUNTER — Other Ambulatory Visit: Payer: Self-pay

## 2019-07-26 ENCOUNTER — Encounter (HOSPITAL_COMMUNITY): Payer: Self-pay | Admitting: Emergency Medicine

## 2019-07-26 ENCOUNTER — Telehealth: Payer: Self-pay | Admitting: Women's Health

## 2019-07-26 ENCOUNTER — Inpatient Hospital Stay (HOSPITAL_COMMUNITY)
Admission: AD | Admit: 2019-07-26 | Discharge: 2019-07-26 | Disposition: A | Discharge status: Still In Hospital | Payer: BC Managed Care – PPO | Source: Home / Self Care | Attending: Obstetrics & Gynecology | Admitting: Obstetrics & Gynecology

## 2019-07-26 DIAGNOSIS — G43109 Migraine with aura, not intractable, without status migrainosus: Secondary | ICD-10-CM

## 2019-07-26 DIAGNOSIS — R262 Difficulty in walking, not elsewhere classified: Secondary | ICD-10-CM | POA: Insufficient documentation

## 2019-07-26 DIAGNOSIS — R51 Headache: Secondary | ICD-10-CM | POA: Diagnosis not present

## 2019-07-26 DIAGNOSIS — R2990 Unspecified symptoms and signs involving the nervous system: Secondary | ICD-10-CM

## 2019-07-26 DIAGNOSIS — R269 Unspecified abnormalities of gait and mobility: Secondary | ICD-10-CM

## 2019-07-26 DIAGNOSIS — R479 Unspecified speech disturbances: Secondary | ICD-10-CM

## 2019-07-26 DIAGNOSIS — Z79899 Other long term (current) drug therapy: Secondary | ICD-10-CM | POA: Insufficient documentation

## 2019-07-26 DIAGNOSIS — Z3A32 32 weeks gestation of pregnancy: Secondary | ICD-10-CM | POA: Insufficient documentation

## 2019-07-26 DIAGNOSIS — R110 Nausea: Secondary | ICD-10-CM | POA: Insufficient documentation

## 2019-07-26 DIAGNOSIS — O26833 Pregnancy related renal disease, third trimester: Secondary | ICD-10-CM | POA: Diagnosis not present

## 2019-07-26 DIAGNOSIS — H538 Other visual disturbances: Secondary | ICD-10-CM | POA: Diagnosis not present

## 2019-07-26 DIAGNOSIS — G43909 Migraine, unspecified, not intractable, without status migrainosus: Secondary | ICD-10-CM | POA: Diagnosis not present

## 2019-07-26 DIAGNOSIS — F909 Attention-deficit hyperactivity disorder, unspecified type: Secondary | ICD-10-CM | POA: Insufficient documentation

## 2019-07-26 DIAGNOSIS — O26893 Other specified pregnancy related conditions, third trimester: Secondary | ICD-10-CM | POA: Insufficient documentation

## 2019-07-26 DIAGNOSIS — R42 Dizziness and giddiness: Secondary | ICD-10-CM

## 2019-07-26 DIAGNOSIS — H9201 Otalgia, right ear: Secondary | ICD-10-CM | POA: Insufficient documentation

## 2019-07-26 DIAGNOSIS — R4781 Slurred speech: Secondary | ICD-10-CM | POA: Insufficient documentation

## 2019-07-26 DIAGNOSIS — R519 Headache, unspecified: Secondary | ICD-10-CM

## 2019-07-26 DIAGNOSIS — O99353 Diseases of the nervous system complicating pregnancy, third trimester: Secondary | ICD-10-CM | POA: Insufficient documentation

## 2019-07-26 HISTORY — DX: Headache, unspecified: R51.9

## 2019-07-26 LAB — URINALYSIS, ROUTINE W REFLEX MICROSCOPIC
Bilirubin Urine: NEGATIVE
Glucose, UA: NEGATIVE mg/dL
Hgb urine dipstick: NEGATIVE
Ketones, ur: NEGATIVE mg/dL
Leukocytes,Ua: NEGATIVE
Nitrite: NEGATIVE
Protein, ur: NEGATIVE mg/dL
Specific Gravity, Urine: 1.008 (ref 1.005–1.030)
pH: 7 (ref 5.0–8.0)

## 2019-07-26 LAB — BASIC METABOLIC PANEL
Anion gap: 9 (ref 5–15)
BUN: 5 mg/dL — ABNORMAL LOW (ref 6–20)
CO2: 24 mmol/L (ref 22–32)
Calcium: 9.1 mg/dL (ref 8.9–10.3)
Chloride: 105 mmol/L (ref 98–111)
Creatinine, Ser: 0.54 mg/dL (ref 0.44–1.00)
GFR calc Af Amer: >60 mL/min (ref >60)
GFR calc non Af Amer: >60 mL/min (ref >60)
Glucose, Bld: 94 mg/dL (ref 70–99)
Potassium: 3.9 mmol/L (ref 3.5–5.1)
Sodium: 138 mmol/L (ref 135–145)

## 2019-07-26 LAB — CBC WITH DIFFERENTIAL/PLATELET
Abs Immature Granulocytes: 0.09 10*3/uL — ABNORMAL HIGH (ref 0.00–0.07)
Basophils Absolute: 0 10*3/uL (ref 0.0–0.1)
Basophils Relative: 0 %
Eosinophils Absolute: 0.1 10*3/uL (ref 0.0–0.5)
Eosinophils Relative: 1 %
HCT: 39.2 % (ref 36.0–46.0)
Hemoglobin: 12.7 g/dL (ref 12.0–15.0)
Immature Granulocytes: 1 %
Lymphocytes Relative: 17 %
Lymphs Abs: 1.9 10*3/uL (ref 0.7–4.0)
MCH: 30 pg (ref 26.0–34.0)
MCHC: 32.4 g/dL (ref 30.0–36.0)
MCV: 92.5 fL (ref 80.0–100.0)
Monocytes Absolute: 0.6 10*3/uL (ref 0.1–1.0)
Monocytes Relative: 5 %
Neutro Abs: 8.6 10*3/uL — ABNORMAL HIGH (ref 1.7–7.7)
Neutrophils Relative %: 76 %
Platelets: 109 10*3/uL — ABNORMAL LOW (ref 150–400)
RBC: 4.24 MIL/uL (ref 3.87–5.11)
RDW: 13.8 % (ref 11.5–15.5)
WBC: 11.2 10*3/uL — ABNORMAL HIGH (ref 4.0–10.5)
nRBC: 0 % (ref 0.0–0.2)

## 2019-07-26 NOTE — MAU Provider Note (Signed)
Chief Complaint:  Dizziness   None     HPI: Donna Kim is a 38 y.o. G4P3003 at [redacted]w[redacted]d by LMP with medical hx significant for migraines, postpartum HTN with previous pregnancy, and stable depression who presents to maternity admissions reporting 1 week of intermittent h/a, vision changes, vertigo, slurred speech, difficulty walking and pain in her right ear.  She reports that on 8/13 she had a migraine with aura which resolved without treatment.  Then, 3 days later, she had 3 severe headaches that started behind her eyes and radiated to the back of her head, more on the right side that the left.  She called the office on Monday 07/19/19 and was prescribed Fioricet for h/a.  The Fioricet makes the headache pain better but does not resolve it and her h/a persist off and on daily.  Since the headaches started, she has also developed speech changes, having to concentrate on speaking or her speech is slurred.  There is also new onset right ear pain intermittently x 2-3 days.  She reports when she closes her eyes she cannot stand up straight and starts to fall. When she walks, she feels like she pulls to the right and cannot walk straight. She denies any falls since symptom onset.  Her husband reports that she seems more forgetful in the last 2-3 days, with difficulties with short term memory.  She has not tried any other treatments. There is no abdominal pain, no vaginal bleeding, leakage of fluid, or other obstetric complaints. She reports good fetal movement.  HPI  Past Medical History: Past Medical History:  Diagnosis Date  . ADHD   . Back pain 11/12/2013  . Cholestasis 08/2017  . Contraceptive management 08/12/2014  . Headache   . Mental disorder    anxiety  . No pertinent past medical history   . Postpartum bleeding 07/15/2014  . Rosacea   . UTI (urinary tract infection)   . Vaginal Pap smear, abnormal     Past obstetric history: OB History  Gravida Para Term Preterm AB Living  4 3 3      3   SAB TAB Ectopic Multiple Live Births        0 3    # Outcome Date GA Lbr Len/2nd Weight Sex Delivery Anes PTL Lv  4 Current           3 Term 10/25/17 [redacted]w[redacted]d 01:44 / 00:06 2574 g F Vag-Spont EPI  LIV  2 Term 07/04/14 [redacted]w[redacted]d 01:59 / 00:08 4065 g F Vag-Spont None N LIV  1 Term 01/02/12 [redacted]w[redacted]d 10:50 / 05:14 3249 g F Vag-Spont EPI N LIV     Birth Comments: none    Past Surgical History: Past Surgical History:  Procedure Laterality Date  . WISDOM TOOTH EXTRACTION      Family History: Family History  Adopted: Yes  Problem Relation Age of Onset  . ADD / ADHD Daughter     Social History: Social History   Tobacco Use  . Smoking status: Never Smoker  . Smokeless tobacco: Never Used  Substance Use Topics  . Alcohol use: No  . Drug use: No    Allergies:  Allergies  Allergen Reactions  . Cefzil [Cefprozil] Itching and Nausea And Vomiting    Meds:  No medications prior to admission.    ROS:  Review of Systems  Constitutional: Negative for chills, fatigue and fever.  Eyes: Negative for visual disturbance.  Respiratory: Negative for shortness of breath.   Cardiovascular: Negative for chest  pain.  Gastrointestinal: Negative for abdominal pain, nausea and vomiting.  Genitourinary: Negative for difficulty urinating, dysuria, flank pain, pelvic pain, vaginal bleeding, vaginal discharge and vaginal pain.  Neurological: Negative for dizziness and headaches.  Psychiatric/Behavioral: Negative.      I have reviewed patient's Past Medical Hx, Surgical Hx, Family Hx, Social Hx, medications and allergies.   Physical Exam   Patient Vitals for the past 24 hrs:  BP Temp Temp src Pulse Resp SpO2 Weight  07/26/19 2035 112/65 - - 78 19 99 % -  07/26/19 1949 125/76 98.4 F (36.9 C) Oral 82 20 - 109.7 kg   Constitutional: Well-developed, well-nourished female in no acute distress.  HEART: normal rate, heart sounds, regular rhythm RESP: normal effort, lung sounds clear and equal  bilaterally GI: Abd soft, non-tender, gravid appropriate for gestational age.  MS: Extremities nontender, no edema, normal ROM Physical Examination: Neurological - PERRLA, neck supple without rigidity, cranial nerves II through XII intact, DTR's normal and symmetric, grip normal bilaterally upper extremities, abnormal findings: Romberg sign positive, gait abnormal with abnormal shift to right side when walking  GU: Neg CVAT.  PELVIC EXAM: Deferred     FHT:  Baseline 120 , moderate variability, accelerations present, no decelerations Contractions: None on toco or to palpation   Labs: Results for orders placed or performed during the hospital encounter of 07/26/19 (from the past 24 hour(s))  Urinalysis, Routine w reflex microscopic     Status: None   Collection Time: 07/26/19  8:33 PM  Result Value Ref Range   Color, Urine YELLOW YELLOW   APPearance CLEAR CLEAR   Specific Gravity, Urine 1.008 1.005 - 1.030   pH 7.0 5.0 - 8.0   Glucose, UA NEGATIVE NEGATIVE mg/dL   Hgb urine dipstick NEGATIVE NEGATIVE   Bilirubin Urine NEGATIVE NEGATIVE   Ketones, ur NEGATIVE NEGATIVE mg/dL   Protein, ur NEGATIVE NEGATIVE mg/dL   Nitrite NEGATIVE NEGATIVE   Leukocytes,Ua NEGATIVE NEGATIVE   O/Positive/-- (04/15 1149)  Imaging:  No results found.  MAU Course/MDM: Orders Placed This Encounter  Procedures  . Urinalysis, Routine w reflex microscopic    No orders of the defined types were placed in this encounter.    NST reviewed and reactive. Pt without abnormal obstetric findings.   Neuro exam abnormal mostly related to balance/gait and also speech, pt without hx of difficulties with gait, balance, or sleep prior to this week, even with hx of migraines Pt cleared from obstetric standpoint.   Consult ED provider, and pt sent to ED for further evaluation Pt stable at time of transfer    Assessment: 1. Abnormal neurological exam   2. Difficulty with speech   3. Migraine with aura and  without status migrainosus, not intractable   4. Gait abnormality   5. Dizziness of unknown etiology     Plan: Transfer to ED for further evaluation   Fatima Blank Certified Nurse-Midwife 07/26/2019 9:32 PM

## 2019-07-26 NOTE — MAU Note (Addendum)
Pt reports to MAU c/o   Last Sunday pt reports she had severe occular migraines where she was seeing lights in her vision. Then pt reports she had another one on Monday and was evaluated by her OB and she didn't have protine or high BP. Pt reports this whole week she has had a hard time balancing. Pt reports she has had hard time forming sentences and has had trouble speaking and has been slurring words. Pt reports pressure behind her eyes and is having ear pain. Pt reports her OB asked her to come here. Pt reports some DFM since this all started but he has been moving some. Pt denies bleeding or LOF. Pt reports occasional braxton hicks.

## 2019-07-26 NOTE — Telephone Encounter (Signed)
Patient called, stated that her head has been hurting, stated Donna Kim stated if they didn't get better that she would send her for a CT.  She is stating that she is having trouble with her balance.  Having pain behind her eyes.  She [facility] to [facility]  Having trouble processing her thoughts.  She is 32 weeks.  [phone]

## 2019-07-26 NOTE — ED Triage Notes (Addendum)
Patient reports persistent migraine headache with vertigo and nausea onset last week unrelieved by prescription medication , denies fever or chills . Patient is [redacted] weeks pregnant G4P3, denies abdominal cramping or vaginal bleeding .

## 2019-07-27 ENCOUNTER — Emergency Department (HOSPITAL_COMMUNITY): Payer: BC Managed Care – PPO

## 2019-07-27 DIAGNOSIS — O26833 Pregnancy related renal disease, third trimester: Secondary | ICD-10-CM | POA: Diagnosis not present

## 2019-07-27 DIAGNOSIS — R51 Headache: Secondary | ICD-10-CM | POA: Diagnosis not present

## 2019-07-27 LAB — PROTEIN / CREATININE RATIO, URINE
Creatinine, Urine: 42.09 mg/dL
Total Protein, Urine: <6 mg/dL

## 2019-07-27 LAB — URINALYSIS, ROUTINE W REFLEX MICROSCOPIC
Bilirubin Urine: NEGATIVE
Glucose, UA: NEGATIVE mg/dL
Hgb urine dipstick: NEGATIVE
Ketones, ur: NEGATIVE mg/dL
Leukocytes,Ua: NEGATIVE
Nitrite: NEGATIVE
Protein, ur: NEGATIVE mg/dL
Specific Gravity, Urine: 1.006 (ref 1.005–1.030)
pH: 6 (ref 5.0–8.0)

## 2019-07-27 LAB — HEPATIC FUNCTION PANEL
ALT: 16 U/L (ref 0–44)
AST: 16 U/L (ref 15–41)
Albumin: 2.9 g/dL — ABNORMAL LOW (ref 3.5–5.0)
Alkaline Phosphatase: 89 U/L (ref 38–126)
Bilirubin, Direct: <0.1 mg/dL (ref 0.0–0.2)
Total Bilirubin: 0.5 mg/dL (ref 0.3–1.2)
Total Protein: 6.3 g/dL — ABNORMAL LOW (ref 6.5–8.1)

## 2019-07-27 MED ORDER — ACETAMINOPHEN 325 MG PO TABS
650.0000 mg | ORAL_TABLET | Freq: Once | ORAL | Status: AC
  Administered 2019-07-27: 650 mg via ORAL
  Filled 2019-07-27: qty 2

## 2019-07-27 MED ORDER — DIPHENHYDRAMINE HCL 50 MG/ML IJ SOLN
12.5000 mg | Freq: Once | INTRAMUSCULAR | Status: AC
  Administered 2019-07-27: 12.5 mg via INTRAVENOUS
  Filled 2019-07-27: qty 1

## 2019-07-27 MED ORDER — METOCLOPRAMIDE HCL 5 MG/ML IJ SOLN
10.0000 mg | Freq: Once | INTRAMUSCULAR | Status: AC
  Administered 2019-07-27: 10 mg via INTRAVENOUS
  Filled 2019-07-27: qty 2

## 2019-07-27 MED ORDER — BUTALBITAL-APAP-CAFFEINE 50-325-40 MG PO TABS: 1.0000 | ORAL_TABLET | ORAL | 0 refills | Status: DC | PRN

## 2019-07-27 MED ORDER — DIPHENHYDRAMINE HCL 50 MG/ML IJ SOLN
25.0000 mg | Freq: Once | INTRAMUSCULAR | Status: AC
  Administered 2019-07-27: 25 mg via INTRAVENOUS
  Filled 2019-07-27: qty 1

## 2019-07-27 MED ORDER — MAGNESIUM SULFATE 2 GM/50ML IV SOLN
2.0000 g | Freq: Once | INTRAVENOUS | Status: AC
  Administered 2019-07-27: 2 g via INTRAVENOUS
  Filled 2019-07-27: qty 50

## 2019-07-27 MED ORDER — METOCLOPRAMIDE HCL 5 MG/ML IJ SOLN
5.0000 mg | Freq: Once | INTRAMUSCULAR | Status: AC
  Administered 2019-07-27: 5 mg via INTRAVENOUS
  Filled 2019-07-27: qty 2

## 2019-07-27 MED ORDER — METOCLOPRAMIDE HCL 10 MG PO TABS: 10.0000 mg | ORAL_TABLET | Freq: Three times a day (TID) | ORAL | 0 refills | Status: DC | PRN

## 2019-07-27 NOTE — ED Notes (Signed)
Returned from radiology. 

## 2019-07-27 NOTE — ED Notes (Signed)
Called [facility]

## 2019-07-27 NOTE — ED Provider Notes (Addendum)
[facility] EMERGENCY DEPARTMENT Provider Note   CSN: [id number] Arrival date & time: 07/26/19  2133     History   Chief Complaint Chief Complaint  Patient presents with  . Migraine    HPI Donna Kim is a 38 y.o. female.     Patient is G4, P3 at [redacted] weeks gestation.  She is presenting with a headache over the past 1 week.  States she has a history of "ocular migraines" but this headache is not similar to this.  States she felt pressure behind his right eye and progressively worsened over the past 1 week.  There is been nausea and photophobia but no vomiting.  No fever.  Earlier yesterday she had some difficulty saying what she wanted to say and was talking slower per her husband.  She is a difficulty walking and trouble standing and veering to the right when she walks.  Her OB instructed her to go to MAU where she was seen and sent to the ED after being obstetrically cleared.  She denies any vaginal bleeding or discharge.  No contractions.  No chest pain or shortness of breath.  She been taking Fioricet at home for the headaches without relief.  Her blood pressure has been normal throughout this pregnancy and she was cleared for any concerns for preeclampsia by OB. She describes pain in the right side of her head the progressively worsens and is associated photophobia and nausea.  The history is provided by the patient.  Migraine Associated symptoms include headaches. Pertinent negatives include no chest pain.    Past Medical History:  Diagnosis Date  . ADHD   . Back pain 11/12/2013  . Cholestasis 08/2017  . Contraceptive management 08/12/2014  . Headache   . Mental disorder    anxiety  . No pertinent past medical history   . Postpartum bleeding 07/15/2014  . Rosacea   . UTI (urinary tract infection)   . Vaginal Pap smear, abnormal     Patient Active Problem List   Diagnosis Date Noted  . Gestational thrombocytopenia ([place]) 06/22/2019  . Supervision  of normal pregnancy 03/17/2019  . History of postpartum hypertension 10/30/2017  . History of cholestasis during pregnancy 08/28/2017  . Depression with anxiety 04/14/2017  . ADHD (attention deficit hyperactivity disorder), inattentive type 10/02/2014  . Rosacea, acne 03/18/2014    Past Surgical History:  Procedure Laterality Date  . WISDOM TOOTH EXTRACTION       OB History    Gravida  4   Para  3   Term  3   Preterm      AB      Living  3     SAB      TAB      Ectopic      Multiple  0   Live Births  3            Home Medications    Prior to Admission medications   Medication Sig Start Date End Date Taking? Authorizing Provider  butalbital-acetaminophen-caffeine (FIORICET) 50-325-40 MG tablet Take 1 tablet by mouth every 4 (four) hours as needed for headache or migraine. 07/20/19   Roma Schanz, CNM  magnesium gluconate (MAGONATE) 500 MG tablet Take 1,200 mg by mouth daily.     [provider]  OVER THE COUNTER MEDICATION Omega 3 supplement wild [place] fish oil    [provider]  Prenatal Vit-Fe Fumarate-FA (MULTIVITAMIN-PRENATAL) 27-0.8 MG TABS tablet Take 1 tablet by mouth daily  at 12 noon.    [provider]  Probiotic Product (PROBIOTIC PO) Take by mouth daily.    [provider]    Family History Family History  Adopted: Yes  Problem Relation Age of Onset  . ADD / ADHD Daughter     Social History Social History   Tobacco Use  . Smoking status: Never Smoker  . Smokeless tobacco: Never Used  Substance Use Topics  . Alcohol use: No  . Drug use: No     Allergies   Cefzil [cefprozil]   Review of Systems Review of Systems  Constitutional: Negative for activity change, appetite change and fever.  HENT: Negative for congestion and rhinorrhea.   Eyes: Positive for visual disturbance.  Respiratory: Negative for cough.   Cardiovascular: Negative for chest pain.  Gastrointestinal: Positive for  nausea.  Genitourinary: Negative for dysuria and hematuria.  Musculoskeletal: Negative for arthralgias and myalgias.  Skin: Negative for rash.  Neurological: Positive for dizziness, speech difficulty, light-headedness and headaches.     all other systems are negative except as noted in the HPI and PMH.   Physical Exam Updated Vital Signs BP 128/80 (BP Location: Left Arm)   Pulse 85   Temp 98.4 F (36.9 C) (Oral)   Resp 18   LMP 12/14/2018   SpO2 98%   Physical Exam Vitals signs and nursing note reviewed.  Constitutional:      General: She is not in acute distress.    Appearance: Normal appearance. She is well-developed and normal weight.  HENT:     Head: Normocephalic and atraumatic.     Mouth/Throat:     Pharynx: No oropharyngeal exudate.  Eyes:     Conjunctiva/sclera: Conjunctivae normal.     Pupils: Pupils are equal, round, and reactive to light.  Neck:     Musculoskeletal: Normal range of motion and neck supple.     Comments: No meningismus. Cardiovascular:     Rate and Rhythm: Normal rate and regular rhythm.     Heart sounds: Normal heart sounds. No murmur.  Pulmonary:     Effort: Pulmonary effort is normal. No respiratory distress.     Breath sounds: Normal breath sounds.  Abdominal:     Palpations: Abdomen is soft.     Tenderness: There is no abdominal tenderness. There is no guarding or rebound.     Comments: Gravid abdomen  Musculoskeletal: Normal range of motion.        General: No tenderness.  Skin:    General: Skin is warm.     Capillary Refill: Capillary refill takes less than 2 seconds.  Neurological:     General: No focal deficit present.     Mental Status: She is alert and oriented to person, place, and time. Mental status is at baseline.     Cranial Nerves: No cranial nerve deficit.     Motor: No abnormal muscle tone.     Coordination: Coordination abnormal.     Gait: Gait abnormal.     Comments: CN 2-12 intact, no ataxia on finger to nose, no  nystagmus, 5/5 strength throughout, no pronator drift, positive Romberg, ataxic gait, veers to the right with walking.   Psychiatric:        Behavior: Behavior normal.      ED Treatments / Results  Labs (all labs ordered are listed, but only abnormal results are displayed) Labs Reviewed  CBC WITH DIFFERENTIAL/PLATELET - Abnormal; Notable for the following components:      Result Value  WBC 11.2 (*)    Platelets 109 (*)    Neutro Abs 8.6 (*)    Abs Immature Granulocytes 0.09 (*)    All other components within normal limits  BASIC METABOLIC PANEL - Abnormal; Notable for the following components:   BUN 5 (*)    All other components within normal limits  HEPATIC FUNCTION PANEL - Abnormal; Notable for the following components:   Total Protein 6.3 (*)    Albumin 2.9 (*)    All other components within normal limits  URINALYSIS, ROUTINE W REFLEX MICROSCOPIC  PROTEIN / CREATININE RATIO, URINE    EKG None  Radiology No results found.  Procedures Procedures (including critical care time)  Medications Ordered in ED Medications  metoCLOPramide (REGLAN) injection 10 mg (has no administration in time range)  diphenhydrAMINE (BENADRYL) injection 25 mg (has no administration in time range)     Initial Impression / Assessment and Plan / ED Course  I have reviewed the triage vital signs and the nursing notes.  Pertinent labs & imaging results that were available during my care of the patient were reviewed by me and considered in my medical decision making (see chart for details).        [redacted] weeks pregnant presenting with 1 week of headache now with speech difficulty, difficulty walking and balance problems.  Seen at MAU and cleared obstetrically.  Blood pressure has been normal.  There was no concern for preeclampsia per OB. BP normal here. Thrombocytopenia appears chronic. LFTs reassuring. UA without proteinuria.  D/w Dr. Rory Percy of neurology who favors complex migraine but  agrees with MRI and MRV imaging. Will add magnesium to migraine cocktail.  Low suspicion for SAH, meningitis, temporal arteritis.   Care transferred at shift change to Dr. Wilson Singer with MRI and MRV pending.   Final Clinical Impressions(s) / ED Diagnoses   Final diagnoses:  None    ED Discharge Orders    None       Kamy Poinsett, Annie Main, MD 07/27/19 0840    Ezequiel Essex, MD 07/27/19 [phone]

## 2019-07-27 NOTE — ED Notes (Signed)
Pt aware that we need urine specimen. 

## 2019-07-27 NOTE — ED Notes (Signed)
Patient transported to MRI 

## 2019-07-27 NOTE — ED Notes (Signed)
Ambulatory steady gait to bathroom and back to room.

## 2019-08-02 ENCOUNTER — Other Ambulatory Visit: Payer: Self-pay

## 2019-08-02 ENCOUNTER — Ambulatory Visit (INDEPENDENT_AMBULATORY_CARE_PROVIDER_SITE_OTHER): Payer: BC Managed Care – PPO | Admitting: Advanced Practice Midwife

## 2019-08-02 VITALS — BP 114/71 | HR 89 | Wt 239.0 lb

## 2019-08-02 DIAGNOSIS — G43109 Migraine with aura, not intractable, without status migrainosus: Secondary | ICD-10-CM

## 2019-08-02 DIAGNOSIS — Z3A33 33 weeks gestation of pregnancy: Secondary | ICD-10-CM

## 2019-08-02 DIAGNOSIS — Z3483 Encounter for supervision of other normal pregnancy, third trimester: Secondary | ICD-10-CM

## 2019-08-02 DIAGNOSIS — O99213 Obesity complicating pregnancy, third trimester: Secondary | ICD-10-CM

## 2019-08-02 DIAGNOSIS — O26893 Other specified pregnancy related conditions, third trimester: Secondary | ICD-10-CM

## 2019-08-02 MED ORDER — BUTALBITAL-APAP-CAFFEINE 50-325-40 MG PO TABS: 1.0000 | ORAL_TABLET | ORAL | 3 refills | Status: DC | PRN

## 2019-08-02 MED ORDER — MECLIZINE HCL 12.5 MG PO TABS: 12.5000 mg | ORAL_TABLET | Freq: Three times a day (TID) | ORAL | 0 refills | Status: DC | PRN

## 2019-08-02 NOTE — Patient Instructions (Signed)

## 2019-08-02 NOTE — Progress Notes (Signed)
   PRENATAL VISIT NOTE  Subjective:  Donna Kim is a 38 y.o. G4P3003 at [redacted]w[redacted]d being seen today for ongoing prenatal care.  She is currently monitored for the following issues for this low-risk pregnancy and has Rosacea, acne; ADHD (attention deficit hyperactivity disorder), inattentive type; Depression with anxiety; History of cholestasis during pregnancy; History of postpartum hypertension; Supervision of normal pregnancy; and Gestational thrombocytopenia ([place]) on their problem list.  Patient reports no bleeding, no contractions, no cramping and no leaking.  Contractions: Not present. Vag. Bleeding: None.  Movement: Present. Denies leaking of fluid.   Patient is s/p evaluation in MAU and [facility] on 08/24 and 08/25 for severe migraine with aura and attendant difficulty with executive functions and ambulation. She states she feels better than she did then but continues to experience daily headaches. She would like to discontinue Reglan as she has concerns about the side effects. She continues to take Fioricet at home and is requesting refills.   The following portions of the patient's history were reviewed and updated as appropriate: allergies, current medications, past family history, past medical history, past social history, past surgical history and problem list. Problem list updated.  Objective:   Vitals:   08/02/19 1137  BP: 114/71  Pulse: 89  Weight: 239 lb (108.4 kg)    Fetal Status: Fetal Heart Rate (bpm): 145 Fundal Height: 34 cm Movement: Present     General:  Alert, oriented and cooperative. Patient is in no acute distress.  Skin: Skin is warm and dry. No rash noted.   Cardiovascular: Normal heart rate noted  Respiratory: Normal respiratory effort, no problems with respiration noted  Abdomen: Soft, gravid, appropriate for gestational age.  Pain/Pressure: Absent     Pelvic: Cervical exam deferred        Extremities: Normal range of motion.  Edema: Trace  Mental Status: Normal  mood and affect. Normal behavior. Normal judgment and thought content.   Assessment and Plan:  Pregnancy: G4P3003 at [redacted]w[redacted]d  1. Encounter for supervision of other normal pregnancy in third trimester - Continue routine care - 36 week swabs next visit  2. Migraine with aura and without status migrainosus, not intractable - Rx Meclizine per patient request and previous discussion with alternate Provider - Discussed Vistaril as possible alternative or addition given patient's self-reported increasing anxiety and intermittent jittery behavior - Patient reassured she can present to MAU for headache cocktail PRN - Reassured that MRI findings from 08/25 were grossly normal with "no acute abnormality"  3. Obesity in pregnancy, antepartum, third trimester - TWG  17 lbs  Preterm labor symptoms and general obstetric precautions including but not limited to vaginal bleeding, contractions, leaking of fluid and fetal movement were reviewed in detail with the patient. Please refer to After Visit Summary for other counseling recommendations.  Return in about 3 weeks (around 08/23/2019) for 36 week swabs.  Future Appointments  Date Time Provider [place]  08/19/2019 10:50 AM Caren Macadam, MD [facility]  09/02/2019 11:10 AM Vevelyn Pat, Joaquim Lai, CNM [facility]   Mallie Snooks, MSN, CNM Certified Nurse Midwife, [facility] for [facility], [facility] 08/02/19 12:15 PM

## 2019-08-19 ENCOUNTER — Other Ambulatory Visit: Payer: Self-pay

## 2019-08-19 ENCOUNTER — Ambulatory Visit (INDEPENDENT_AMBULATORY_CARE_PROVIDER_SITE_OTHER): Payer: BC Managed Care – PPO | Admitting: *Deleted

## 2019-08-19 ENCOUNTER — Other Ambulatory Visit: Payer: BC Managed Care – PPO

## 2019-08-19 ENCOUNTER — Telehealth (INDEPENDENT_AMBULATORY_CARE_PROVIDER_SITE_OTHER): Payer: BC Managed Care – PPO | Admitting: Family Medicine

## 2019-08-19 VITALS — BP 133/89 | HR 89

## 2019-08-19 DIAGNOSIS — G43109 Migraine with aura, not intractable, without status migrainosus: Secondary | ICD-10-CM | POA: Insufficient documentation

## 2019-08-19 DIAGNOSIS — Z8679 Personal history of other diseases of the circulatory system: Secondary | ICD-10-CM

## 2019-08-19 DIAGNOSIS — R030 Elevated blood-pressure reading, without diagnosis of hypertension: Secondary | ICD-10-CM | POA: Diagnosis not present

## 2019-08-19 DIAGNOSIS — D696 Thrombocytopenia, unspecified: Secondary | ICD-10-CM

## 2019-08-19 DIAGNOSIS — Z3A35 35 weeks gestation of pregnancy: Secondary | ICD-10-CM

## 2019-08-19 DIAGNOSIS — Z3483 Encounter for supervision of other normal pregnancy, third trimester: Secondary | ICD-10-CM

## 2019-08-19 DIAGNOSIS — O99113 Other diseases of the blood and blood-forming organs and certain disorders involving the immune mechanism complicating pregnancy, third trimester: Secondary | ICD-10-CM

## 2019-08-19 DIAGNOSIS — Z8759 Personal history of other complications of pregnancy, childbirth and the puerperium: Secondary | ICD-10-CM | POA: Diagnosis not present

## 2019-08-19 NOTE — Progress Notes (Signed)
I connected with@ on 08/19/19 at 10:50 AM EDT by: Mychart and verified that I am speaking with the correct person using two identifiers.  Patient is located at Home and provider is located at [facility].     The purpose of this virtual visit is to provide medical care while limiting exposure to the novel coronavirus. I discussed the limitations, risks, security and privacy concerns of performing an evaluation and management service by MyChart and the availability of in person appointments. I also discussed with the patient that there may be a patient responsible charge related to this service. By engaging in this virtual visit, you consent to the provision of healthcare.  Additionally, you authorize for your insurance to be billed for the services provided during this visit.  The patient expressed understanding and agreed to proceed.  The following staff members participated in the virtual visit:  [facility] LPN      PRENATAL VISIT NOTE  Subjective:  Donna Kim is a 38 y.o. [phone] at [redacted]w[redacted]d  for phone visit for ongoing prenatal care.  She is currently monitored for the following issues for this low-risk pregnancy and has Rosacea, acne; ADHD (attention deficit hyperactivity disorder), inattentive type; Depression with anxiety; History of cholestasis during pregnancy; History of postpartum hypertension; Supervision of normal pregnancy; Gestational thrombocytopenia ([place]); and Migraine with aura on their problem list.  Patient [place]- at the beach and last night. Improve spontaneous-- took fioricet and antevert to help. Reports no aura -- she typically get aura with her migraines. She had photo and phonophobia with HA. These HA have felt different.    Reports spot sparkles in vision fields, reports this has been persistent since developing complex migraines/ocular migraines.   Reports LE swelling-seems to be worsening to patient. Trace. Denies RUQ pain  Contractions: Not present. Vag. Bleeding: None.   Movement: Present. Denies leaking of fluid.   The following portions of the patient's history were reviewed and updated as appropriate: allergies, current medications, past family history, past medical history, past social history, past surgical history and problem list.   Objective:   Vitals:   08/19/19 1102  BP: 133/89  Pulse: 89   Self-Obtained  Fetal Status:     Movement: Present     Assessment and Plan:  Pregnancy: G4P3003 at [redacted]w[redacted]d 1. Benign gestational thrombocytopenia in third trimester ([place]) - CBC   2. History of postpartum hypertension - Comprehensive metabolic panel - CBC w/Diff - Protein / creatinine ratio, urine  3. Encounter for supervision of other normal pregnancy in third trimester Up to date  4. Elevated BP without diagnosis of hypertension Concerning for preeclampsia- HA are different from her migraines but also sound migranous in nature. Has work up in August.  - Patient will come for lab work and BP check at 1:30 - Comprehensive metabolic panel - CBC w/Diff - Protein / creatinine ratio, urine  Preterm labor symptoms and general obstetric precautions including but not limited to vaginal bleeding, contractions, leaking of fluid and fetal movement were reviewed in detail with the patient.  Return in about 1 day (around 08/20/2019) for BP check.  Future Appointments  Date Time Provider [place]  09/02/2019 11:10 AM Cresenzo-Dishmon, Joaquim Lai, CNM [facility]   Time spent on virtual visit: 15 minutes  Caren Macadam, MD

## 2019-08-19 NOTE — Progress Notes (Signed)
Pt reports elevated readings this morning. 126/91, 129/108, 133/89 and 142/94.

## 2019-08-20 LAB — CBC WITH DIFFERENTIAL/PLATELET
Basophils Absolute: 0 10*3/uL (ref 0.0–0.2)
Basos: 0 %
EOS (ABSOLUTE): 0.1 10*3/uL (ref 0.0–0.4)
Eos: 1 %
Hematocrit: 37.1 % (ref 34.0–46.6)
Hemoglobin: 12.6 g/dL (ref 11.1–15.9)
Immature Grans (Abs): 0.1 10*3/uL (ref 0.0–0.1)
Immature Granulocytes: 1 %
Lymphocytes Absolute: 1.5 10*3/uL (ref 0.7–3.1)
Lymphs: 14 %
MCH: 30 pg (ref 26.6–33.0)
MCHC: 34 g/dL (ref 31.5–35.7)
MCV: 88 fL (ref 79–97)
Monocytes Absolute: 0.7 10*3/uL (ref 0.1–0.9)
Monocytes: 6 %
Neutrophils Absolute: 8.3 10*3/uL — ABNORMAL HIGH (ref 1.4–7.0)
Neutrophils: 78 %
Platelets: 105 10*3/uL — ABNORMAL LOW (ref 150–450)
RBC: 4.2 x10E6/uL (ref 3.77–5.28)
RDW: 13.4 % (ref 11.7–15.4)
WBC: 10.6 10*3/uL (ref 3.4–10.8)

## 2019-08-20 LAB — COMPREHENSIVE METABOLIC PANEL
ALT: 15 IU/L (ref 0–32)
AST: 11 IU/L (ref 0–40)
Albumin/Globulin Ratio: 1.3 (ref 1.2–2.2)
Albumin: 3.5 g/dL — ABNORMAL LOW (ref 3.8–4.8)
Alkaline Phosphatase: 113 IU/L (ref 39–117)
BUN/Creatinine Ratio: 12 (ref 9–23)
BUN: 6 mg/dL (ref 6–20)
Bilirubin Total: <0.2 mg/dL (ref 0.0–1.2)
CO2: 21 mmol/L (ref 20–29)
Calcium: 9 mg/dL (ref 8.7–10.2)
Chloride: 105 mmol/L (ref 96–106)
Creatinine, Ser: 0.49 mg/dL — ABNORMAL LOW (ref 0.57–1.00)
GFR calc Af Amer: 146 mL/min/{1.73_m2} (ref >59)
GFR calc non Af Amer: 127 mL/min/{1.73_m2} (ref >59)
Globulin, Total: 2.7 g/dL (ref 1.5–4.5)
Glucose: 92 mg/dL (ref 65–99)
Potassium: 4.1 mmol/L (ref 3.5–5.2)
Sodium: 138 mmol/L (ref 134–144)
Total Protein: 6.2 g/dL (ref 6.0–8.5)

## 2019-08-20 LAB — PROTEIN / CREATININE RATIO, URINE
Creatinine, Urine: 37.8 mg/dL
Protein, Ur: 5.3 mg/dL
Protein/Creat Ratio: 140 mg/g creat (ref 0–200)

## 2019-08-24 ENCOUNTER — Other Ambulatory Visit: Payer: Self-pay | Admitting: Women's Health

## 2019-08-24 DIAGNOSIS — Z3483 Encounter for supervision of other normal pregnancy, third trimester: Secondary | ICD-10-CM

## 2019-08-24 DIAGNOSIS — L299 Pruritus, unspecified: Secondary | ICD-10-CM

## 2019-08-25 DIAGNOSIS — Z3483 Encounter for supervision of other normal pregnancy, third trimester: Secondary | ICD-10-CM | POA: Diagnosis not present

## 2019-08-25 DIAGNOSIS — L299 Pruritus, unspecified: Secondary | ICD-10-CM | POA: Diagnosis not present

## 2019-08-27 LAB — COMPREHENSIVE METABOLIC PANEL
ALT: 11 IU/L (ref 0–32)
AST: 9 IU/L (ref 0–40)
Albumin/Globulin Ratio: 1.4 (ref 1.2–2.2)
Albumin: 3.5 g/dL — ABNORMAL LOW (ref 3.8–4.8)
Alkaline Phosphatase: 122 IU/L — ABNORMAL HIGH (ref 39–117)
BUN/Creatinine Ratio: 14 (ref 9–23)
BUN: 7 mg/dL (ref 6–20)
Bilirubin Total: <0.2 mg/dL (ref 0.0–1.2)
CO2: 20 mmol/L (ref 20–29)
Calcium: 8.9 mg/dL (ref 8.7–10.2)
Chloride: 109 mmol/L — ABNORMAL HIGH (ref 96–106)
Creatinine, Ser: 0.49 mg/dL — ABNORMAL LOW (ref 0.57–1.00)
GFR calc Af Amer: 146 mL/min/{1.73_m2} (ref >59)
GFR calc non Af Amer: 127 mL/min/{1.73_m2} (ref >59)
Globulin, Total: 2.5 g/dL (ref 1.5–4.5)
Glucose: 94 mg/dL (ref 65–99)
Potassium: 4.2 mmol/L (ref 3.5–5.2)
Sodium: 141 mmol/L (ref 134–144)
Total Protein: 6 g/dL (ref 6.0–8.5)

## 2019-08-27 LAB — BILE ACIDS, TOTAL: Bile Acids Total: 3.3 umol/L (ref 0.0–10.0)

## 2019-09-02 ENCOUNTER — Encounter: Payer: Self-pay | Admitting: Advanced Practice Midwife

## 2019-09-02 ENCOUNTER — Ambulatory Visit (INDEPENDENT_AMBULATORY_CARE_PROVIDER_SITE_OTHER): Payer: BC Managed Care – PPO | Admitting: Advanced Practice Midwife

## 2019-09-02 ENCOUNTER — Other Ambulatory Visit: Payer: Self-pay

## 2019-09-02 VITALS — BP 118/80 | HR 88 | Wt 244.0 lb

## 2019-09-02 DIAGNOSIS — Z1389 Encounter for screening for other disorder: Secondary | ICD-10-CM

## 2019-09-02 DIAGNOSIS — Z331 Pregnant state, incidental: Secondary | ICD-10-CM

## 2019-09-02 DIAGNOSIS — Z23 Encounter for immunization: Secondary | ICD-10-CM | POA: Diagnosis not present

## 2019-09-02 DIAGNOSIS — Z3483 Encounter for supervision of other normal pregnancy, third trimester: Secondary | ICD-10-CM

## 2019-09-02 DIAGNOSIS — Z3A37 37 weeks gestation of pregnancy: Secondary | ICD-10-CM

## 2019-09-02 DIAGNOSIS — D696 Thrombocytopenia, unspecified: Secondary | ICD-10-CM

## 2019-09-02 DIAGNOSIS — O99113 Other diseases of the blood and blood-forming organs and certain disorders involving the immune mechanism complicating pregnancy, third trimester: Secondary | ICD-10-CM

## 2019-09-02 LAB — POCT URINALYSIS DIPSTICK OB
Blood, UA: NEGATIVE
Glucose, UA: NEGATIVE
Ketones, UA: NEGATIVE
Leukocytes, UA: NEGATIVE
Nitrite, UA: NEGATIVE
POC,PROTEIN,UA: NEGATIVE

## 2019-09-02 MED ORDER — BUTALBITAL-APAP-CAFFEINE 50-325-40 MG PO TABS: 1.0000 | ORAL_TABLET | ORAL | 3 refills | Status: DC | PRN

## 2019-09-02 NOTE — Progress Notes (Signed)
LOW-RISK PREGNANCY VISIT Patient name: Donna Kim MRN [id number]  Date of birth: 30-Jan-1981 Chief Complaint:   Routine Prenatal Visit (gbs-gc-chl)  History of Present Illness:   Donna Kim is a 38 y.o. G84P3003 female at [redacted]w[redacted]d with an Estimated Date of Delivery: 09/20/19 being seen today for ongoing management of a low-risk pregnancy.  Today she reports no complaints. Contractions: Irregular.  .  Movement: Present. denies leaking of fluid. Review of Systems:   Pertinent items are noted in HPI Denies abnormal vaginal discharge w/ itching/odor/irritation, headaches, visual changes, shortness of breath, chest pain, abdominal pain, severe nausea/vomiting, or problems with urination or bowel movements unless otherwise stated above.  Pertinent History Reviewed:  Medical & Surgical Hx:   Past Medical History:  Diagnosis Date  . ADHD   . Back pain 11/12/2013  . Cholestasis 08/2017  . Contraceptive management 08/12/2014  . Headache   . Mental disorder    anxiety  . No pertinent past medical history   . Postpartum bleeding 07/15/2014  . Rosacea   . UTI (urinary tract infection)   . Vaginal Pap smear, abnormal    Past Surgical History:  Procedure Laterality Date  . WISDOM TOOTH EXTRACTION     Family History  Adopted: Yes  Problem Relation Age of Onset  . ADD / ADHD Daughter     Current Outpatient Medications:  .  butalbital-acetaminophen-caffeine (FIORICET) 50-325-40 MG tablet, Take 1 tablet by mouth every 4 (four) hours as needed for headache or migraine., Disp: 15 tablet, Rfl: 3 .  meclizine (ANTIVERT) 12.5 MG tablet, Take 1 tablet (12.5 mg total) by mouth 3 (three) times daily as needed for dizziness., Disp: 30 tablet, Rfl: 0 .  NON FORMULARY, Probiotic- daily, Disp: , Rfl:  .  OVER THE COUNTER MEDICATION, Omega 3 supplement wild [place] fish oil, Disp: , Rfl:  .  Prenatal Vit-Fe Fumarate-FA (MULTIVITAMIN-PRENATAL) 27-0.8 MG TABS tablet, Take 1 tablet by mouth daily at 12  noon., Disp: , Rfl:  Social History: Reviewed -  reports that she has never smoked. She has never used smokeless tobacco.  Physical Assessment:   Vitals:   09/02/19 1141  BP: 118/80  Pulse: 88  Weight: 244 lb (110.7 kg)  Body mass index is 40.6 kg/m.        Physical Examination:   General appearance: Well appearing, and in no distress  Mental status: Alert, oriented to person, place, and time  Skin: Warm & dry  Cardiovascular: Normal heart rate noted  Respiratory: Normal respiratory effort, no distress  Abdomen: Soft, gravid, nontender  Pelvic: Cervical exam performed  Dilation: 3 Effacement (%): 50    Extremities: Edema: Trace  Fetal Status:     Movement: Present Presentation: Vertex  Results for orders placed or performed in visit on 09/02/19 (from the past 24 hour(s))  POC Urinalysis Dipstick OB   Collection Time: 09/02/19 11:53 AM  Result Value Ref Range   Color, UA     Clarity, UA     Glucose, UA Negative Negative   Bilirubin, UA     Ketones, UA neg    Spec Grav, UA     Blood, UA neg    pH, UA     POC,PROTEIN,UA Negative Negative, Trace, Small (1+), Moderate (2+), Large (3+), 4+   Urobilinogen, UA     Nitrite, UA neg    Leukocytes, UA Negative Negative   Appearance     Odor      Assessment & Plan:  1) Low-risk pregnancy  [id number] at [redacted]w[redacted]d with an Estimated Date of Delivery: 09/20/19   2) HA's, , not as bad, refilled fioricet.   Labs/procedures/US today:   Plan:  Continue routine obstetrical care    Follow-up: Return for 10/9 and 10/16 for LROB.  Orders Placed This Encounter  Procedures  . Strep Gp B NAA  . GC/Chlamydia Probe Amp  . Flu Vaccine QUAD 36+ mos IM (Fluarix, Quad PF)  . POC Urinalysis Dipstick OB   Christin Fudge CNM 09/02/2019 1:13 PM'

## 2019-09-02 NOTE — Patient Instructions (Signed)

## 2019-09-05 LAB — GC/CHLAMYDIA PROBE AMP
Chlamydia trachomatis, NAA: NEGATIVE
Neisseria Gonorrhoeae by PCR: NEGATIVE

## 2019-09-09 ENCOUNTER — Encounter: Payer: Self-pay | Admitting: Advanced Practice Midwife

## 2019-09-09 ENCOUNTER — Encounter: Payer: BC Managed Care – PPO | Admitting: Obstetrics and Gynecology

## 2019-09-09 ENCOUNTER — Ambulatory Visit (INDEPENDENT_AMBULATORY_CARE_PROVIDER_SITE_OTHER): Payer: BC Managed Care – PPO | Admitting: Advanced Practice Midwife

## 2019-09-09 ENCOUNTER — Other Ambulatory Visit: Payer: Self-pay

## 2019-09-09 VITALS — BP 109/73 | HR 86 | Wt 245.0 lb

## 2019-09-09 DIAGNOSIS — Z1389 Encounter for screening for other disorder: Secondary | ICD-10-CM

## 2019-09-09 DIAGNOSIS — Z3483 Encounter for supervision of other normal pregnancy, third trimester: Secondary | ICD-10-CM

## 2019-09-09 DIAGNOSIS — Z3A38 38 weeks gestation of pregnancy: Secondary | ICD-10-CM

## 2019-09-09 DIAGNOSIS — Z331 Pregnant state, incidental: Secondary | ICD-10-CM

## 2019-09-09 DIAGNOSIS — Z3A37 37 weeks gestation of pregnancy: Secondary | ICD-10-CM | POA: Diagnosis not present

## 2019-09-09 LAB — POCT URINALYSIS DIPSTICK OB
Blood, UA: NEGATIVE
Glucose, UA: NEGATIVE
Ketones, UA: NEGATIVE
Leukocytes, UA: NEGATIVE
Nitrite, UA: NEGATIVE
POC,PROTEIN,UA: NEGATIVE

## 2019-09-09 NOTE — Progress Notes (Signed)
   PRENATAL VISIT NOTE  Subjective:  Donna Kim is a 38 y.o. G4P3003 at [redacted]w[redacted]d being seen today for ongoing prenatal care.  She is currently monitored for the following issues for this low-risk pregnancy and has Rosacea, acne; ADHD (attention deficit hyperactivity disorder), inattentive type; Depression with anxiety; History of cholestasis during pregnancy; History of postpartum hypertension; Supervision of normal pregnancy; Gestational thrombocytopenia ([place]); and Migraine with aura on their problem list.  Patient reports no complaints.  Contractions: Irregular. Vag. Bleeding: None.  Movement: Present. Denies leaking of fluid.   The following portions of the patient's history were reviewed and updated as appropriate: allergies, current medications, past family history, past medical history, past social history, past surgical history and problem list.   Objective:   Vitals:   09/09/19 1416  BP: 109/73  Pulse: 86  Weight: 111.1 kg    Fetal Status: Fetal Heart Rate (bpm): 160 Fundal Height: 40 cm Movement: Present     General:  Alert, oriented and cooperative. Patient is in no acute distress.  Skin: Skin is warm and dry. No rash noted.   Cardiovascular: Normal heart rate noted  Respiratory: Normal respiratory effort, no problems with respiration noted  Abdomen: Soft, gravid, appropriate for gestational age.  Pain/Pressure: Present     Pelvic: Cervical exam performed        Extremities: Normal range of motion.  Edema: Trace  Mental Status: Normal mood and affect. Normal behavior. Normal judgment and thought content.   Assessment and Plan:  Pregnancy: G4P3003 at [redacted]w[redacted]d 1. Encounter for supervision of other normal pregnancy in third trimester - Routine prenatal care - GBS recollected. Results never resulted.   - Follow up in 1 week for ROB and membrane sweep  2. Screening for genitourinary condition - Urine dipstick  - POC Urinalysis Dipstick OB  3. Pregnant state, incidental   - POC Urinalysis Dipstick OB  Term labor symptoms and general obstetric precautions including but not limited to vaginal bleeding, contractions, leaking of fluid and fetal movement were reviewed in detail with the patient. Please refer to After Visit Summary for other counseling recommendations.    Future Appointments  Date Time Provider [place]  09/17/2019 12:10 PM Eure, Mertie Clause, MD [facility]    Maryagnes Amos, SNM

## 2019-09-10 LAB — STREP GP B NAA

## 2019-09-11 LAB — STREP GP B NAA: Strep Gp B NAA: POSITIVE — AB

## 2019-09-17 ENCOUNTER — Ambulatory Visit (INDEPENDENT_AMBULATORY_CARE_PROVIDER_SITE_OTHER): Payer: BC Managed Care – PPO | Admitting: Obstetrics & Gynecology

## 2019-09-17 ENCOUNTER — Other Ambulatory Visit: Payer: Self-pay

## 2019-09-17 ENCOUNTER — Encounter: Payer: BC Managed Care – PPO | Admitting: Women's Health

## 2019-09-17 VITALS — BP 124/80 | HR 103 | Wt 246.0 lb

## 2019-09-17 DIAGNOSIS — Z3A39 39 weeks gestation of pregnancy: Secondary | ICD-10-CM

## 2019-09-17 DIAGNOSIS — O99113 Other diseases of the blood and blood-forming organs and certain disorders involving the immune mechanism complicating pregnancy, third trimester: Secondary | ICD-10-CM

## 2019-09-17 DIAGNOSIS — Z3483 Encounter for supervision of other normal pregnancy, third trimester: Secondary | ICD-10-CM

## 2019-09-17 DIAGNOSIS — D696 Thrombocytopenia, unspecified: Secondary | ICD-10-CM

## 2019-09-17 NOTE — Progress Notes (Signed)
   LOW-RISK PREGNANCY VISIT Patient name: Donna Kim MRN [id number]  Date of birth: 1981-04-22 Chief Complaint:   Routine Prenatal Visit  History of Present Illness:   Donna Kim is a 38 y.o. G20P3003 female at [redacted]w[redacted]d with an Estimated Date of Delivery: 09/20/19 being seen today for ongoing management of a low-risk pregnancy.  Today she reports no complaints. Contractions: Irregular. Vag. Bleeding: None.  Movement: Present. denies leaking of fluid. Review of Systems:   Pertinent items are noted in HPI Denies abnormal vaginal discharge w/ itching/odor/irritation, headaches, visual changes, shortness of breath, chest pain, abdominal pain, severe nausea/vomiting, or problems with urination or bowel movements unless otherwise stated above. Pertinent History Reviewed:  Reviewed past medical,surgical, social, obstetrical and family history.  Reviewed problem list, medications and allergies. Physical Assessment:   Vitals:   09/17/19 1222  BP: 124/80  Pulse: (!) 103  Weight: 246 lb (111.6 kg)  Body mass index is 40.94 kg/m.        Physical Examination:   General appearance: Well appearing, and in no distress  Mental status: Alert, oriented to person, place, and time  Skin: Warm & dry  Cardiovascular: Normal heart rate noted  Respiratory: Normal respiratory effort, no distress  Abdomen: Soft, gravid, nontender  Pelvic: Cervical exam deferred         Extremities: Edema: Trace  Fetal Status:     Movement: Present    No results found for this or any previous visit (from the past 24 hour(s)).  Assessment & Plan:  1) Low-risk pregnancy G4P3003 at [redacted]w[redacted]d with an Estimated Date of Delivery: 09/20/19   2) Gestational thrombocytopenia, 105K   Meds: No orders of the defined types were placed in this encounter.  Labs/procedures today:   Plan:  Continue routine obstetrical care NST Next visit: prefers in person    Reviewed: Term labor symptoms and general obstetric precautions  including but not limited to vaginal bleeding, contractions, leaking of fluid and fetal movement were reviewed in detail with the patient.  All questions were answered. Has home bp cuff. Rx faxed to . Check bp weekly, let us know if >140/90.   Follow-up: Return in about 1 week (around 09/24/2019) for NST, LROB.  No orders of the defined types were placed in this encounter.  Florian Buff  09/17/2019 12:27 PM

## 2019-09-19 ENCOUNTER — Inpatient Hospital Stay (HOSPITAL_COMMUNITY): Payer: BC Managed Care – PPO | Admitting: Anesthesiology

## 2019-09-19 ENCOUNTER — Encounter (HOSPITAL_COMMUNITY): Payer: Self-pay | Admitting: *Deleted

## 2019-09-19 ENCOUNTER — Inpatient Hospital Stay (HOSPITAL_COMMUNITY)
Admission: AD | Admit: 2019-09-19 | Discharge: 2019-09-21 | DRG: 806 | Disposition: A | Discharge status: Home / Self Care | Payer: BC Managed Care – PPO | Attending: Family Medicine | Admitting: Family Medicine

## 2019-09-19 ENCOUNTER — Other Ambulatory Visit: Payer: Self-pay

## 2019-09-19 DIAGNOSIS — D6959 Other secondary thrombocytopenia: Secondary | ICD-10-CM | POA: Diagnosis not present

## 2019-09-19 DIAGNOSIS — O99344 Other mental disorders complicating childbirth: Secondary | ICD-10-CM | POA: Diagnosis present

## 2019-09-19 DIAGNOSIS — O26893 Other specified pregnancy related conditions, third trimester: Secondary | ICD-10-CM | POA: Diagnosis not present

## 2019-09-19 DIAGNOSIS — O99824 Streptococcus B carrier state complicating childbirth: Secondary | ICD-10-CM | POA: Diagnosis present

## 2019-09-19 DIAGNOSIS — Z2882 Immunization not carried out because of caregiver refusal: Secondary | ICD-10-CM | POA: Diagnosis not present

## 2019-09-19 DIAGNOSIS — Z20828 Contact with and (suspected) exposure to other viral communicable diseases: Secondary | ICD-10-CM | POA: Diagnosis present

## 2019-09-19 DIAGNOSIS — Z3483 Encounter for supervision of other normal pregnancy, third trimester: Secondary | ICD-10-CM

## 2019-09-19 DIAGNOSIS — Z3A39 39 weeks gestation of pregnancy: Secondary | ICD-10-CM

## 2019-09-19 DIAGNOSIS — O99214 Obesity complicating childbirth: Secondary | ICD-10-CM | POA: Diagnosis not present

## 2019-09-19 DIAGNOSIS — O9912 Other diseases of the blood and blood-forming organs and certain disorders involving the immune mechanism complicating childbirth: Secondary | ICD-10-CM | POA: Diagnosis present

## 2019-09-19 DIAGNOSIS — D696 Thrombocytopenia, unspecified: Secondary | ICD-10-CM | POA: Diagnosis present

## 2019-09-19 DIAGNOSIS — F418 Other specified anxiety disorders: Secondary | ICD-10-CM | POA: Diagnosis present

## 2019-09-19 DIAGNOSIS — O99119 Other diseases of the blood and blood-forming organs and certain disorders involving the immune mechanism complicating pregnancy, unspecified trimester: Secondary | ICD-10-CM | POA: Diagnosis present

## 2019-09-19 LAB — CBC
HCT: 39.8 % (ref 36.0–46.0)
HCT: 41.2 % (ref 36.0–46.0)
Hemoglobin: 13.6 g/dL (ref 12.0–15.0)
Hemoglobin: 14.1 g/dL (ref 12.0–15.0)
MCH: 30.6 pg (ref 26.0–34.0)
MCH: 30.8 pg (ref 26.0–34.0)
MCHC: 34.2 g/dL (ref 30.0–36.0)
MCHC: 34.2 g/dL (ref 30.0–36.0)
MCV: 89.4 fL (ref 80.0–100.0)
MCV: 90 fL (ref 80.0–100.0)
Platelets: 112 10*3/uL — ABNORMAL LOW (ref 150–400)
Platelets: 137 10*3/uL — ABNORMAL LOW (ref 150–400)
RBC: 4.45 MIL/uL (ref 3.87–5.11)
RBC: 4.58 MIL/uL (ref 3.87–5.11)
RDW: 14 % (ref 11.5–15.5)
RDW: 13.9 % (ref 11.5–15.5)
WBC: 11.9 10*3/uL — ABNORMAL HIGH (ref 4.0–10.5)
WBC: 16.5 10*3/uL — ABNORMAL HIGH (ref 4.0–10.5)
nRBC: 0 % (ref 0.0–0.2)
nRBC: 0 % (ref 0.0–0.2)

## 2019-09-19 LAB — URINALYSIS, ROUTINE W REFLEX MICROSCOPIC
Bacteria, UA: NONE SEEN
Bilirubin Urine: NEGATIVE
Glucose, UA: NEGATIVE mg/dL
Hgb urine dipstick: NEGATIVE
Ketones, ur: 20 mg/dL — AB
Nitrite: NEGATIVE
Protein, ur: NEGATIVE mg/dL
Specific Gravity, Urine: 1.026 (ref 1.005–1.030)
pH: 5 (ref 5.0–8.0)

## 2019-09-19 LAB — TYPE AND SCREEN
ABO/RH(D): O POS
Antibody Screen: NEGATIVE

## 2019-09-19 LAB — ABO/RH: ABO/RH(D): O POS

## 2019-09-19 LAB — SARS CORONAVIRUS 2 BY RT PCR (HOSPITAL ORDER, PERFORMED IN ~~LOC~~ HOSPITAL LAB): SARS Coronavirus 2: NEGATIVE

## 2019-09-19 MED ORDER — BENZOCAINE-MENTHOL 20-0.5 % EX AERO
1.0000 "application " | INHALATION_SPRAY | CUTANEOUS | Status: DC | PRN
  Filled 2019-09-19: qty 56

## 2019-09-19 MED ORDER — LACTATED RINGERS IV SOLN: INTRAVENOUS | Status: DC

## 2019-09-19 MED ORDER — FENTANYL-BUPIVACAINE-NACL 0.5-0.125-0.9 MG/250ML-% EP SOLN
EPIDURAL | Status: AC
  Filled 2019-09-19: qty 250

## 2019-09-19 MED ORDER — OXYTOCIN BOLUS FROM INFUSION
500.0000 mL | Freq: Once | INTRAVENOUS | Status: AC
  Administered 2019-09-19: 13:00:00 500 mL via INTRAVENOUS

## 2019-09-19 MED ORDER — TETANUS-DIPHTH-ACELL PERTUSSIS 5-2.5-18.5 LF-MCG/0.5 IM SUSP: 0.5000 mL | Freq: Once | INTRAMUSCULAR | Status: DC

## 2019-09-19 MED ORDER — ONDANSETRON HCL 4 MG PO TABS: 4.0000 mg | ORAL_TABLET | ORAL | Status: DC | PRN

## 2019-09-19 MED ORDER — ACETAMINOPHEN 325 MG PO TABS: 650.0000 mg | ORAL_TABLET | ORAL | Status: DC | PRN

## 2019-09-19 MED ORDER — ONDANSETRON HCL 4 MG/2ML IJ SOLN: 4.0000 mg | Freq: Four times a day (QID) | INTRAMUSCULAR | Status: DC | PRN

## 2019-09-19 MED ORDER — DIBUCAINE (PERIANAL) 1 % EX OINT: 1.0000 "application " | TOPICAL_OINTMENT | CUTANEOUS | Status: DC | PRN

## 2019-09-19 MED ORDER — COCONUT OIL OIL
1.0000 "application " | TOPICAL_OIL | Status: DC | PRN
  Administered 2019-09-19: 1 via TOPICAL

## 2019-09-19 MED ORDER — PHENYLEPHRINE 40 MCG/ML (10ML) SYRINGE FOR IV PUSH (FOR BLOOD PRESSURE SUPPORT): 80.0000 ug | PREFILLED_SYRINGE | INTRAVENOUS | Status: DC | PRN

## 2019-09-19 MED ORDER — WITCH HAZEL-GLYCERIN EX PADS: 1.0000 "application " | MEDICATED_PAD | CUTANEOUS | Status: DC | PRN

## 2019-09-19 MED ORDER — LIDOCAINE HCL (PF) 1 % IJ SOLN: 30.0000 mL | INTRAMUSCULAR | Status: DC | PRN

## 2019-09-19 MED ORDER — EPHEDRINE 5 MG/ML INJ: 10.0000 mg | INTRAVENOUS | Status: DC | PRN

## 2019-09-19 MED ORDER — FENTANYL-BUPIVACAINE-NACL 0.5-0.125-0.9 MG/250ML-% EP SOLN: 12.0000 mL/h | EPIDURAL | Status: DC | PRN

## 2019-09-19 MED ORDER — SOD CITRATE-CITRIC ACID 500-334 MG/5ML PO SOLN: 30.0000 mL | ORAL | Status: DC | PRN

## 2019-09-19 MED ORDER — LIDOCAINE HCL (PF) 1 % IJ SOLN
INTRAMUSCULAR | Status: DC | PRN
  Administered 2019-09-19 (×2): 4 mL via EPIDURAL

## 2019-09-19 MED ORDER — ACETAMINOPHEN 325 MG PO TABS
650.0000 mg | ORAL_TABLET | Freq: Four times a day (QID) | ORAL | Status: DC | PRN
  Administered 2019-09-19 – 2019-09-21 (×4): 650 mg via ORAL
  Filled 2019-09-19 (×4): qty 2

## 2019-09-19 MED ORDER — SODIUM CHLORIDE 0.9 % IV SOLN
2.0000 g | Freq: Once | INTRAVENOUS | Status: AC
  Administered 2019-09-19: 2 g via INTRAVENOUS
  Filled 2019-09-19: qty 2000

## 2019-09-19 MED ORDER — FLEET ENEMA 7-19 GM/118ML RE ENEM: 1.0000 | ENEMA | RECTAL | Status: DC | PRN

## 2019-09-19 MED ORDER — SENNOSIDES-DOCUSATE SODIUM 8.6-50 MG PO TABS
2.0000 | ORAL_TABLET | ORAL | Status: DC
  Administered 2019-09-19 – 2019-09-20 (×2): 2 via ORAL
  Filled 2019-09-19 (×2): qty 2

## 2019-09-19 MED ORDER — DIPHENHYDRAMINE HCL 25 MG PO CAPS: 25.0000 mg | ORAL_CAPSULE | Freq: Four times a day (QID) | ORAL | Status: DC | PRN

## 2019-09-19 MED ORDER — PRENATAL MULTIVITAMIN CH
1.0000 | ORAL_TABLET | Freq: Every day | ORAL | Status: DC
  Administered 2019-09-20 – 2019-09-21 (×2): 1 via ORAL
  Filled 2019-09-19 (×2): qty 1

## 2019-09-19 MED ORDER — OXYTOCIN 40 UNITS IN NORMAL SALINE INFUSION - SIMPLE MED
2.5000 [IU]/h | INTRAVENOUS | Status: DC
  Administered 2019-09-19: 13:00:00 2.5 [IU]/h via INTRAVENOUS
  Filled 2019-09-19: qty 1000

## 2019-09-19 MED ORDER — SODIUM CHLORIDE (PF) 0.9 % IJ SOLN
INTRAMUSCULAR | Status: DC | PRN
  Administered 2019-09-19: 12 mL/h via EPIDURAL

## 2019-09-19 MED ORDER — LACTATED RINGERS IV SOLN: 500.0000 mL | INTRAVENOUS | Status: DC | PRN

## 2019-09-19 MED ORDER — DIPHENHYDRAMINE HCL 50 MG/ML IJ SOLN: 12.5000 mg | INTRAMUSCULAR | Status: DC | PRN

## 2019-09-19 MED ORDER — LACTATED RINGERS IV SOLN: 500.0000 mL | Freq: Once | INTRAVENOUS | Status: DC

## 2019-09-19 MED ORDER — IBUPROFEN 600 MG PO TABS
600.0000 mg | ORAL_TABLET | Freq: Three times a day (TID) | ORAL | Status: DC | PRN
  Administered 2019-09-19 – 2019-09-21 (×5): 600 mg via ORAL
  Filled 2019-09-19 (×5): qty 1

## 2019-09-19 MED ORDER — SIMETHICONE 80 MG PO CHEW: 80.0000 mg | CHEWABLE_TABLET | ORAL | Status: DC | PRN

## 2019-09-19 MED ORDER — PHENYLEPHRINE 40 MCG/ML (10ML) SYRINGE FOR IV PUSH (FOR BLOOD PRESSURE SUPPORT)
PREFILLED_SYRINGE | INTRAVENOUS | Status: AC
  Filled 2019-09-19: qty 10

## 2019-09-19 MED ORDER — MEASLES, MUMPS & RUBELLA VAC IJ SOLR: 0.5000 mL | Freq: Once | INTRAMUSCULAR | Status: DC

## 2019-09-19 MED ORDER — ONDANSETRON HCL 4 MG/2ML IJ SOLN: 4.0000 mg | INTRAMUSCULAR | Status: DC | PRN

## 2019-09-19 NOTE — Progress Notes (Signed)
Difficult to monitor baby due to maternal size, positon

## 2019-09-19 NOTE — MAU Note (Signed)
Pt reports to mau with c/o ctx since last night that have increased in frequency since waking up around 0700 this morning.  Pt denies any LOF or vag bleeding.

## 2019-09-19 NOTE — Anesthesia Preprocedure Evaluation (Signed)
Anesthesia Evaluation  Patient identified by MRN, date of birth, ID band Patient awake    Reviewed: Allergy & Precautions, H&P , Patient's Chart, lab work & pertinent test results  Airway Mallampati: II  TM Distance: >3 FB Neck ROM: full    Dental no notable dental hx. (+) Dental Advisory Given   Pulmonary neg pulmonary ROS,    Pulmonary exam normal breath sounds clear to auscultation       Cardiovascular negative cardio ROS   Rhythm:regular Rate:Normal     Neuro/Psych PSYCHIATRIC DISORDERS Anxiety Depression negative neurological ROS     GI/Hepatic negative GI ROS, Neg liver ROS,   Endo/Other  Morbid obesity  Renal/GU negative Renal ROS     Musculoskeletal   Abdominal (+) + obese,   Peds  Hematology negative hematology ROS (+)   Anesthesia Other Findings   Reproductive/Obstetrics (+) Pregnancy                             Anesthesia Physical  Anesthesia Plan  ASA: III  Anesthesia Plan: Epidural   Post-op Pain Management:    Induction:   PONV Risk Score and Plan:   Airway Management Planned:   Additional Equipment:   Intra-op Plan:   Post-operative Plan:   Informed Consent: I have reviewed the patients History and Physical, chart, labs and discussed the procedure including the risks, benefits and alternatives for the proposed anesthesia with the patient or authorized representative who has indicated his/her understanding and acceptance.       Plan Discussed with:   Anesthesia Plan Comments:         Anesthesia Quick Evaluation

## 2019-09-19 NOTE — Progress Notes (Signed)
Still awaiting cbc results for epidural.  Pt OOB moving Pt very anxious not tolerating uc's well.  Pt having a lot of back pain.  Pt moving nurses hand while attempting to locate fhr

## 2019-09-19 NOTE — Progress Notes (Signed)
Still awaiting cbc results for epidural

## 2019-09-19 NOTE — H&P (Signed)
Donna Kim is a 38 y.o. female presenting for SOL. At time of admission she denies vaginal bleeding, leaking of fluid, decreased fetal movement, fever, falls, or recent illness. She is coping very well with contractions via focused breathing and holding hands with CNM.  Patient and her husband Dian Situ report history of "ten seconds hung up at the shoulders" with their 2015 delivery [phone]). She states this baby feels smaller.  Prenatal History --Care at [facility] --Dating by LMP c/w 7 week [place] --Care initiated at 13 weeks  --Low risk NIPS --Gestational Thrombocytopenia, Platelets 105k on 08/19/19 --Rubella Immune --TDAP 07/20 --Flu shot 09/02/19  OB History    Gravida  4   Para  3   Term  3   Preterm      AB      Living  3     SAB      TAB      Ectopic      Multiple  0   Live Births  3          Patient Active Problem List   Diagnosis Date Noted  . Migraine with aura 08/19/2019  . Gestational thrombocytopenia ([place]) 06/22/2019  . Supervision of normal pregnancy 03/17/2019  . History of postpartum hypertension 10/30/2017  . History of cholestasis during pregnancy 08/28/2017  . Depression with anxiety 04/14/2017  . ADHD (attention deficit hyperactivity disorder), inattentive type 10/02/2014  . Rosacea, acne 03/18/2014   Past Medical History:  Diagnosis Date  . ADHD   . Back pain 11/12/2013  . Cholestasis 08/2017  . Contraceptive management 08/12/2014  . Headache   . Mental disorder    anxiety  . No pertinent past medical history   . Postpartum bleeding 07/15/2014  . Rosacea   . UTI (urinary tract infection)   . Vaginal Pap smear, abnormal    Past Surgical History:  Procedure Laterality Date  . WISDOM TOOTH EXTRACTION     Family History: family history includes ADD / ADHD in her daughter. She was adopted. Social History:  reports that she has never smoked. She has never used smokeless tobacco. She reports that she does not drink alcohol or use  drugs.     Maternal Diabetes: No Genetic Screening: Normal Maternal Ultrasounds/Referrals: Normal Fetal Ultrasounds or other Referrals:  None Maternal Substance Abuse:  No Significant Maternal Medications:  None Significant Maternal Lab Results:  Group B Strep negative Other Comments:  None  Review of Systems  Constitutional: Negative for chills and fever.  Eyes: Negative for blurred vision.  Respiratory: Negative for shortness of breath.   Cardiovascular: Negative for chest pain.  Gastrointestinal: Positive for abdominal pain.  Neurological: Negative for headaches.  All other systems reviewed and are negative.    Last menstrual period 12/14/2018, not currently breastfeeding. Physical Exam  Nursing note and vitals reviewed. Constitutional: She is oriented to person, place, and time. She appears well-developed and well-nourished.  Cardiovascular: Normal rate.  Respiratory: Effort normal and breath sounds normal. No respiratory distress.  GI: Soft. She exhibits no distension. There is no abdominal tenderness. There is no rebound and no guarding.  Gravid  Neurological: She is alert and oriented to person, place, and time.  Skin: Skin is warm and dry.  Psychiatric: She has a normal mood and affect. Her behavior is normal. Judgment and thought content normal.    Prenatal labs: ABO, Rh: O/Positive/-- (04/15 1149) Antibody: Negative (07/20 0920) Rubella: 4.33 (04/15 1149) RPR: Non Reactive (07/20 0920)  HBsAg: Negative (  04/15 1149)  HIV: Non Reactive (07/20 0920)  GBS: --Tessie Fass (10/08 1629)   Fetal Tracing Reassuring variability on brief tracing in MAU Variable decels noted CTX q 2-4 min, palpate strong  Assessment/Plan: --38 y.o. G4P3003 at [redacted]w[redacted]d  --Category II tracing --Advance labor, no urge to push --GBS +, order placed for Ancef --Hx brief shoulder dystocia per patient, L&D Charge RN made aware --Gestational Thrombocytopenia, history of unmedicated labor for  same in previous pregnancy --Patient accompanied to L&D for warm handoff  --boy/no circ/breast/postpartum IUD  Darlina Rumpf, CNM 09/19/2019, 11:10 AM

## 2019-09-19 NOTE — Anesthesia Procedure Notes (Signed)
Epidural Patient location during procedure: OB  Staffing Anesthesiologist: Rayel Santizo, MD Performed: anesthesiologist   Preanesthetic Checklist Completed: patient identified, pre-op evaluation, timeout performed, IV checked, risks and benefits discussed and monitors and equipment checked  Epidural Patient position: sitting Prep: site prepped and draped and DuraPrep Patient monitoring: heart rate, continuous pulse ox and blood pressure Approach: midline Location: L3-L4 Injection technique: LOR air and LOR saline  Needle:  Needle type: Tuohy  Needle gauge: 17 G Needle length: 9 cm Needle insertion depth: 6 cm Catheter type: closed end flexible Catheter size: 19 Gauge Catheter at skin depth: 11 cm Test dose: negative  Assessment Sensory level: T8 Events: blood not aspirated, injection not painful, no injection resistance, negative IV test and no paresthesia  Additional Notes Reason for block:procedure for pain     

## 2019-09-19 NOTE — Discharge Summary (Signed)
Postpartum Discharge Summary     Patient Name: Donna Kim DOB: [birth year] MRN: [id number]  Date of admission: 09/19/2019 Delivering Provider: Ulla Gallo B   Date of discharge: 09/21/2019  Admitting diagnosis: ctx  Intrauterine pregnancy: [redacted]w[redacted]d    Secondary diagnosis:  Active Problems:   Depression with anxiety   [place]ational [place]ocytopenia ([place]   [redacted] weeks gestation of pregnancy  Additional problems: None     Discharge diagnosis: Term Pregnancy Delivered                                                                                                Post partum procedures: 2st degree laceration   Augmentation: None  Complications: 1st degree laceration  Hospital course:  Onset of Labor With Vaginal Delivery     38y.o. yo [id number] 36w6das admitted in Active Labor on 09/19/2019. Patient had an uncomplicated labor course as follows. Initial SVE 8 cm. Patient desired epidural. Ampicillin was started for GBS ppx. She progressed to complete shortly after receiving epidural with delivery complicated by nuchal cord x1 and 2st degree laceration.   Membrane Rupture Time/Date: 12:32 PM ,09/19/2019   Intrapartum Procedures: Episiotomy: None [1]                                         Lacerations:  2nd degree [3];Perineal [11]  Patient had a delivery of a Viable infant. 09/19/2019  Information for the patient's newborn:  TuNiharika, Savino0[[id number]]Delivery Method: Vag-Spont     Pateint had an uncomplicated postpartum course.  She is ambulating, tolerating a regular diet, passing flatus, and urinating well. Patient is discharged home in stable condition on 09/21/19.  Delivery time: 12:40 PM    Magnesium Sulfate received: No BMZ received: No Rhophylac:No MMR:No Transfusion:No  Physical exam  Vitals:   09/20/19 0519 09/20/19 1515 09/20/19 2125 09/21/19 0430  BP: 105/64 125/71 121/79 113/83  Pulse: 75 79 81 67  Resp: _0 Temp: 98.1 F (36.7 C) 98  F (36.7 C) 98.3 F (36.8 C) 98.2 F (36.8 C)  TempSrc: Oral Oral Oral Oral  SpO2:      Weight:      Height:       General: alert Lochia: appropriate Uterine Fundus: firm Incision: N/A DVT Evaluation: No evidence of DVT seen on physical exam. Labs: Lab Results  Component Value Date   WBC 16.5 (H) 09/19/2019   HGB 14.1 09/19/2019   HCT 41.2 09/19/2019   MCV 90.0 09/19/2019   PLT 137 (L) 09/19/2019   CMP Latest Ref Rng & Units 08/25/2019  Glucose 65 - 99 mg/dL 94  BUN 6 - 20 mg/dL 7  Creatinine 0.57 - 1.00 mg/dL 0.49(L)  Sodium 134 - 144 mmol/L 141  Potassium 3.5 - 5.2 mmol/L 4.2  Chloride 96 - 106 mmol/L 109(H)  CO2 20 - 29 mmol/L 20  Calcium 8.7 - 10.2 mg/dL 8.9  Total Protein 6.0 - 8.5 g/dL 6.0  Total Bilirubin  0.0 - 1.2 mg/dL <0.2  Alkaline Phos 39 - 117 IU/L 122(H)  AST 0 - 40 IU/L 9  ALT 0 - 32 IU/L 11    Discharge instruction: per After Visit Summary and "Baby and Me Booklet".  After visit meds:  Allergies as of 09/21/2019      Reactions   Cefzil [cefprozil] Itching, Nausea And Vomiting      Medication List    STOP taking these medications   butalbital-acetaminophen-caffeine 50-325-40 MG tablet Commonly known as: FIORICET   meclizine 12.5 MG tablet Commonly known as: ANTIVERT   multivitamin-prenatal 27-0.8 MG Tabs tablet   NON FORMULARY   OVER THE COUNTER MEDICATION     TAKE these medications   ibuprofen 600 MG tablet Commonly known as: ADVIL Take 1 tablet (600 mg total) by mouth every 8 (eight) hours as needed for mild pain.       Diet: No evidence of DVT seen on physical exam.  Activity: Advance as tolerated. Pelvic rest for 6 weeks.   Outpatient follow up:4 weeks Follow up Appt: Future Appointments  Date Time Provider [place]  10/25/2019 11:30 AM Cresenzo-Dishmon, Joaquim Lai, CNM [facility]   Follow up Visit: Follow-up Information    [facility] Follow up on 10/25/2019.   Specialty: Obstetrics and  Gynecology Why: for postpartum appointment. Call and make appointment for IUD any time after 11/23 Contact information: [address] [place] [phone]           Please schedule this patient for Postpartum visit in: 4 weeks with the following provider: Any provider Low risk pregnancy complicated by: none Delivery mode:  SVD Anticipated Birth Control:  POP's with IUD at postpartum visit PP Procedures needed: None  Schedule Integrated [place] visit: yes  Newborn Data: Live born female  Birth Weight: 3640 g APGAR: 8,9  Newborn Delivery   Birth date/time: 09/19/2019 12:40:00 Delivery type:       Baby Feeding: Breast Disposition:home with mother   09/21/2019 Christin Fudge, CNM

## 2019-09-19 NOTE — Progress Notes (Signed)
Anesthesia stepped out to take phone call

## 2019-09-20 LAB — RPR: RPR Ser Ql: NONREACTIVE

## 2019-09-20 NOTE — Progress Notes (Signed)
CSW received consult for history of anxiety, depression and PPD.  CSW met with MOB to offer support and complete assessment.    MOB resting in bed holding infant with FOB present at bedside, when CSW entered the room. MOB and FOB both very welcoming of CSW visit and extremely pleasant and engaged. CSW introduced self and explained reason for consult to which MOB expressed understanding. MOB voiced having a very positive experience with CSW who met with her following her last delivery. CSW inquired about MOB's mental health history and MOB acknowledged experiencing severe PPD with her second child for a year. MOB reported she had a history of depression and anxiety prior to having children and was seeing a counselor. MOB shared with CSW that she started taking a form of magnesium that she felt helped manage her symptoms. MOB reported her daughter sees a counselor who has also worked with MOB in an effort to help her daughter and MOB reported a positive experience with the counselor. CSW spent time with MOB and FOB and processed MOB's current feelings and coping skills. MOB very self aware and insightful regarding her mental health. FOB very involved and supportive of MOB. MOB very aware of PMADs signs and symptoms but was receptive of CSW providing refresher of education regarding the baby blues period vs. perinatal mood disorders. CSW recommended self-evaluation during the postpartum time period using the New Mom Checklist from Postpartum Progress and encouraged MOB to contact a medical professional if symptoms are noted at any time. MOB denied any current mental health symptoms and reported feeling "very good". MOB did not appear to be displaying any acute mental health symptoms and denied any current SI or HI.  MOB and FOB confirmed having all essential items for infant once discharged and reported infant would be sleeping in a bassinet once home. CSW provided review of Sudden Infant Death Syndrome (SIDS)  precautions and safe sleeping habits.    CSW identifies no further need for intervention and no barriers to discharge at this time.  Zori Benbrook, LCSWA  [facility] [phone]  

## 2019-09-20 NOTE — Progress Notes (Signed)
POSTPARTUM PROGRESS NOTE  Post Partum Day 1  Subjective:  38 y.o. yo [id number] at [redacted]w[redacted]d s/p NSVD admitted for active labor.  She reports she is doing well. No acute events overnight. She denies any problems with ambulating, voiding or po intake. Denies nausea or vomiting.  Pain is well controlled.  Lochia is moderate.  Objective: Blood pressure 105/64, pulse 75, temperature 98.1 F (36.7 C), temperature source Oral, resp. rate 14, height 5\' 5"  (1.651 m), weight 111.1 kg, last menstrual period 12/14/2018, SpO2 100 %, unknown if currently breastfeeding.  Physical Exam:  General: alert, cooperative and no distress Chest: no respiratory distress Heart:regular rate, distal pulses intact Abdomen: soft, nontender,  Uterine Fundus: firm, appropriately tender DVT Evaluation: No calf swelling or tenderness Extremities: no edema Skin: warm, dry  Recent Labs    09/19/19 1104 09/19/19 1458  HGB 13.6 14.1  HCT 39.8 41.2    Assessment/Plan: 38 y.o. yo [id number] at [redacted]w[redacted]d s/p NSVD admitted for active labor. Pregnancy complicated by GBS+ status, gestational thrombocytopenia,  hx postpartum depression , and anxiety.   #PPD#1 - Doing well. Continue routine postpartum care #Gestational thrombocytopenia- Plt improved to 137 from 105. No evidence of HELLP syndrome or coagulopathy at this time.  #Hx Postpartum depression-Will need 2 wk f/u outpatient. On no medications at this time.  #Contraception: IUD at post partum visit  #Feeding: breast  #Dispo: Plan for discharge likely tomorrow. Received inadequate ppx .     LOS: 1 day   Maxie Better, PGY-1, MD+ Labor and Delivery Teaching service  09/20/2019, 9:47 AM

## 2019-09-20 NOTE — Lactation Note (Addendum)
This note was copied from a baby's chart. Lactation Consultation Note  Patient Name: Donna Kim [id number] Date: 09/20/2019   P4, Baby 75 hours old and latched upon entering. Mother did not bf her first two children very long but bf the last one for 13 mos.  During consult baby unlatched and re-latched easily w/ intermittent swallows. After birth, baby was jittery and had low blood sugar so he was given formula. Mother states that she is continuing to give him 10-12 ml of formula after feedings if still showing feeding cues. Provided pillows for support. Discussed how breastmilk can help with blood sugar also especially with baby that is latching well like hers. Feed on demand with cues.  Goal 8-12+ times per day after first 24 hrs.  Place baby STS if not cueing.  Provided lactation brochure and suggest mother call if assistance is needed.        Maternal Data    Feeding Feeding Type: Breast Fed Nipple Type: Slow - flow  LATCH Score                   Interventions    Lactation Tools Discussed/Used     Consult Status      Carlye Grippe 09/20/2019, 10:06 AM

## 2019-09-20 NOTE — Anesthesia Postprocedure Evaluation (Signed)
Anesthesia Post Note  Patient: Donna Kim  Procedure(s) Performed: AN AD HOC LABOR EPIDURAL     Patient location during evaluation: Mother Baby Anesthesia Type: Epidural Level of consciousness: awake Pain management: satisfactory to patient Vital Signs Assessment: post-procedure vital signs reviewed and stable Respiratory status: spontaneous breathing Cardiovascular status: stable Anesthetic complications: no    Last Vitals:  Vitals:   09/20/19 0015 09/20/19 0519  BP: 109/73 105/64  Pulse: 76 75  Resp: 20 14  Temp: 36.6 C 36.7 C  SpO2:      Last Pain:  Vitals:   09/20/19 0520  TempSrc:   PainSc: 2    Pain Goal: Patients Stated Pain Goal: 2 (09/19/19 2345)                 Casimer Lanius

## 2019-09-20 NOTE — Discharge Instructions (Signed)

## 2019-09-21 MED ORDER — IBUPROFEN 600 MG PO TABS: 600.0000 mg | ORAL_TABLET | Freq: Three times a day (TID) | ORAL | 0 refills | Status: DC | PRN

## 2019-09-21 NOTE — Lactation Note (Signed)
This note was copied from a baby's chart. Lactation Consultation Note  Patient Name: Donna Kim [id number] Date: 09/21/2019 Reason for consult: Follow-up assessment   P4, Baby 66 hours old and latching as we are entering. Mother states baby cluster fed last night and gave baby formula. Reviewed volume guidelines and encouraged her to bf before offering formula. Reviewed hand expression with good flow of transitional breastmilk which mother was reassured to view. Feed on demand with cues.  Goal 8-12+ times per day after first 24 hrs.  Place baby STS if not cueing.  Encouraged bf before offering formula. Feed on demand with cues.  Goal 8-12+ times per day after first 24 hrs.  Place baby STS if not cueing.  Reviewed engorgement care and monitoring voids/stools.   Maternal Data    Feeding Feeding Type: Breast Fed  LATCH Score Latch: Grasps breast easily, tongue down, lips flanged, rhythmical sucking.(latched upon entry )  Audible Swallowing: A few with stimulation  Type of Nipple: Everted at rest and after stimulation  Comfort (Breast/Nipple): Filling, red/small blisters or bruises, mild/mod discomfort  Hold (Positioning): No assistance needed to correctly position infant at breast.  LATCH Score: 8  Interventions Interventions: Breast feeding basics reviewed;Comfort gels;Hand express  Lactation Tools Discussed/Used     Consult Status Consult Status: Complete Date: 09/21/19    Vivianne Master [facility] 09/21/2019, 9:12 AM

## 2019-09-23 ENCOUNTER — Other Ambulatory Visit: Payer: BC Managed Care – PPO | Admitting: Advanced Practice Midwife

## 2019-10-15 ENCOUNTER — Telehealth: Payer: Self-pay | Admitting: *Deleted

## 2019-10-15 NOTE — Telephone Encounter (Signed)
Patient left message that she has started experiencing postpartum anxiety. Would like to know what she can do about it?

## 2019-10-18 ENCOUNTER — Other Ambulatory Visit: Payer: Self-pay | Admitting: Women's Health

## 2019-10-18 MED ORDER — SERTRALINE HCL 25 MG PO TABS: 25.0000 mg | ORAL_TABLET | Freq: Every day | ORAL | 6 refills | Status: DC

## 2019-10-25 ENCOUNTER — Other Ambulatory Visit: Payer: Self-pay

## 2019-10-25 ENCOUNTER — Encounter: Payer: Self-pay | Admitting: Women's Health

## 2019-10-25 ENCOUNTER — Telehealth (INDEPENDENT_AMBULATORY_CARE_PROVIDER_SITE_OTHER): Payer: BC Managed Care – PPO | Admitting: Women's Health

## 2019-10-25 DIAGNOSIS — F418 Other specified anxiety disorders: Secondary | ICD-10-CM

## 2019-10-25 NOTE — Progress Notes (Signed)
TELEHEALTH VIRTUAL POSTPARTUM VISIT ENCOUNTER NOTE Patient name: Donna Kim MRN [id number]  Date of birth: May 08, 1981  I connected with patient on 10/25/19 at 11:30 AM EST by MyChart video  and verified that I am speaking with the correct person using two identifiers. Due to COVID-19 recommendations, pt is not currently in our office.    I discussed the limitations, risks, security and privacy concerns of performing an evaluation and management service by telephone and the availability of in person appointments. I also discussed with the patient that there may be a patient responsible charge related to this service. The patient expressed understanding and agreed to proceed.  Chief Complaint:   Postpartum Care  History of Present Illness:   Donna Kim is a 38 y.o. G60P4004 Caucasian female being evaluated today for a postpartum visit. She is 5 weeks postpartum following a spontaneous vaginal delivery at 39.6 gestational weeks. Anesthesia: epidural. Laceration: 1st degree. I have fully reviewed the prenatal and intrapartum course. Pregnancy complicated by gestational thrombocytopenia, last plt count 137 in hospital. Postpartum course has been complicated by dep/anx, started zoloft 25mg  11/16 after conversation on mychart. Bleeding thin lochia. Bowel function is normal. Bladder function is normal.  Patient is not sexually active. Last sexual activity: tried a couple of weeks ago, then stopped  Contraception method is wants IUD, appt tomorrow.  Last pap 04/14/17.  Results were normal .  No LMP recorded.  Baby's course has been uncomplicated. Baby is feeding by breast   Flavia Shipper Postpartum Depression Screening: negative, feels much better after starting zoloft, however seems to wear off towards the evening. To try cutting in 1/2 and take 1/2 in am, 1/2 pm Edinburgh Postnatal Depression Scale - 10/25/19 1136      Edinburgh Postnatal Depression Scale:  In the Past 7 Days   I have been able  to laugh and see the funny side of things.  0    I have looked forward with enjoyment to things.  0    I have blamed myself unnecessarily when things went wrong.  0    I have been anxious or worried for no good reason.  1    I have felt scared or panicky for no good reason.  1    Things have been getting on top of me.  1    I have been so unhappy that I have had difficulty sleeping.  0    I have felt sad or miserable.  0    I have been so unhappy that I have been crying.  0    The thought of harming myself has occurred to me.  0    Edinburgh Postnatal Depression Scale Total  3      Review of Systems:   Pertinent items are noted in HPI Denies Abnormal vaginal discharge w/ itching/odor/irritation, headaches, visual changes, shortness of breath, chest pain, abdominal pain, severe nausea/vomiting, or problems with urination or bowel movements. Pertinent History Reviewed:  Reviewed past medical,surgical, obstetrical and family history.  Reviewed problem list, medications and allergies. OB History  Gravida Para Term Preterm AB Living  4 4 4     4   SAB TAB Ectopic Multiple Live Births        0 4    # Outcome Date GA Lbr Len/2nd Weight Sex Delivery Anes PTL Lv  4 Term 09/19/19 [redacted]w[redacted]d 05:32 / 00:08 8 lb 0.4 oz (3.64 kg) M Vag-Spont EPI  LIV  3 Term 10/25/17 [redacted]w[redacted]d 01:44 / 00:06 5  lb 10.8 oz (2.574 kg) F Vag-Spont EPI  LIV  2 Term 07/04/14 [redacted]w[redacted]d 01:59 / 00:08 8 lb 15.4 oz (4.065 kg) F Vag-Spont None N LIV  1 Term 01/02/12 [redacted]w[redacted]d 10:50 / 05:14 7 lb 2.6 oz (3.249 kg) F Vag-Spont EPI N LIV     Birth Comments: none   Physical Assessment:   Vitals:   10/25/19 1130  BP: 120/88  Pulse: 82  Weight: 233 lb (105.7 kg)  Height: 5\' 5"  (1.651 m)  Body mass index is 38.77 kg/m.       Physical Examination:  General:  Alert, oriented and cooperative.   Mental Status: Normal mood and affect perceived. Normal judgment and thought content.  Rest of physical exam deferred due to type of encounter        No results found for this or any previous visit (from the past 24 hour(s)).  Assessment & Plan:  1) Postpartum exam 2) 5 wks s/p SVB 3) Breastfeeding 4) Depression screening 5) Contraception counseling, pt prefers abstinence until IUD tomrrow  6) Dep/anx> on zoloft 25mg  daily, wears off toward evening, to try 12.5AM/12.5PM 7) Gestational thrombocytopenia> will check cbc tomorrow  Meds: No orders of the defined types were placed in this encounter.   I discussed the assessment and treatment plan with the patient. The patient was provided an opportunity to ask questions and all were answered. The patient agreed with the plan and demonstrated an understanding of the instructions.   The patient was advised to call back or seek an in-person evaluation/go to the ED for any concerning postpartum symptoms.  I provided 15 minutes of non-face-to-face time during this encounter.  Follow-up: Return for As scheduled tomorrow for IUD.   No orders of the defined types were placed in this encounter.   [place], [facility] 10/25/2019 12:09 PM

## 2019-10-26 ENCOUNTER — Ambulatory Visit (INDEPENDENT_AMBULATORY_CARE_PROVIDER_SITE_OTHER): Payer: BC Managed Care – PPO | Admitting: Women's Health

## 2019-10-26 ENCOUNTER — Encounter: Payer: Self-pay | Admitting: Women's Health

## 2019-10-26 VITALS — BP 125/78 | HR 86

## 2019-10-26 DIAGNOSIS — Z30430 Encounter for insertion of intrauterine contraceptive device: Secondary | ICD-10-CM | POA: Diagnosis not present

## 2019-10-26 DIAGNOSIS — Z3202 Encounter for pregnancy test, result negative: Secondary | ICD-10-CM | POA: Diagnosis not present

## 2019-10-26 LAB — POCT URINE PREGNANCY: Preg Test, Ur: NEGATIVE

## 2019-10-26 MED ORDER — PARAGARD INTRAUTERINE COPPER IU IUD
INTRAUTERINE_SYSTEM | Freq: Once | INTRAUTERINE | Status: AC
  Administered 2019-10-26: 12:00:00 via INTRAUTERINE

## 2019-10-26 NOTE — Patient Instructions (Signed)
 Nothing in vagina for 3 days (no sex, douching, tampons, etc...)  Check your strings once a month to make sure you can feel them, if you are not able to please let us know  If you develop a fever of 100.4 or more in the next few weeks, or if you develop severe abdominal pain, please let us know  Use a backup method of birth control, such as condoms, for 2 weeks     Intrauterine Device Insertion, Care After  This sheet gives you information about how to care for yourself after your procedure. Your health care provider may also give you more specific instructions. If you have problems or questions, contact your health care provider. What can I expect after the procedure? After the procedure, it is common to have:  Cramps and pain in the abdomen.  Light bleeding (spotting) or heavier bleeding that is like your menstrual period. This may last for up to a few days.  Lower back pain.  Dizziness.  Headaches.  Nausea. Follow these instructions at home:  Before resuming sexual activity, check to make sure that you can feel the IUD string(s). You should be able to feel the end of the string(s) below the opening of your cervix. If your IUD string is in place, you may resume sexual activity. ? If you had a hormonal IUD inserted more than 7 days after your most recent period started, you will need to use a backup method of birth control for 7 days after IUD insertion. Ask your health care provider whether this applies to you.  Continue to check that the IUD is still in place by feeling for the string(s) after every menstrual period, or once a month.  Take over-the-counter and prescription medicines only as told by your health care provider.  Do not drive or use heavy machinery while taking prescription pain medicine.  Keep all follow-up visits as told by your health care provider. This is important. Contact a health care provider if:  You have bleeding that is heavier or lasts longer  than a normal menstrual cycle.  You have a fever.  You have cramps or abdominal pain that get worse or do not get better with medicine.  You develop abdominal pain that is new or is not in the same area of earlier cramping and pain.  You feel lightheaded or weak.  You have abnormal or bad-smelling discharge from your vagina.  You have pain during sexual activity.  You have any of the following problems with your IUD string(s): ? The string bothers or hurts you or your sexual partner. ? You cannot feel the string. ? The string has gotten longer.  You can feel the IUD in your vagina.  You think you may be pregnant, or you miss your menstrual period.  You think you may have an STI (sexually transmitted infection). Get help right away if:  You have flu-like symptoms.  You have a fever and chills.  You can feel that your IUD has slipped out of place. Summary  After the procedure, it is common to have cramps and pain in the abdomen. It is also common to have light bleeding (spotting) or heavier bleeding that is like your menstrual period.  Continue to check that the IUD is still in place by feeling for the string(s) after every menstrual period, or once a month.  Keep all follow-up visits as told by your health care provider. This is important.  Contact your health care provider   you have problems with your IUD string(s), such as the string getting longer or bothering you or your sexual partner. This information is not intended to replace advice given to you by your health care provider. Make sure you discuss any questions you have with your health care provider. Document Released: 07/17/2011 Document Revised: 10/31/2017 Document Reviewed: 10/09/2016 Elsevier Patient Education  2020 [employer].

## 2019-10-26 NOTE — Progress Notes (Signed)
   IUD INSERTION Patient name: Donna Kim MRN [id number]  Date of birth: [birth year] Subjective Findings:   Donna Kim is a 38 y.o. G43P4004 Caucasian female being seen today for insertion of a Paragard IUD.   No LMP recorded. Last sexual intercourse was >2wks ago Last pap5/14/18. Results were:  normal  The risks and benefits of the method and placement have been thouroughly reviewed with the patient and all questions were answered.  Specifically the patient is aware of failure rate of 12/998, expulsion of the IUD and of possible perforation.  The patient is aware of irregular bleeding due to the method and understands the incidence of irregular bleeding diminishes with time.  Signed copy of informed consent in chart.  Pertinent History Reviewed:   Reviewed past medical,surgical, social, obstetrical and family history.  Reviewed problem list, medications and allergies. Objective Findings & Procedure:   Vitals:   10/26/19 1130  BP: 125/78  Pulse: 86  There is no height or weight on file to calculate BMI.  Results for orders placed or performed in visit on 10/26/19 (from the past 24 hour(s))  POCT urine pregnancy   Collection Time: 10/26/19 11:31 AM  Result Value Ref Range   Preg Test, Ur Negative Negative    Procedure by Maryagnes Amos, SNM  Time out was performed.  A graves speculum was placed in the vagina.  The cervix was visualized, prepped using Betadine, and grasped with a single tooth tenaculum. The uterus was found to be retroflexed and it sounded to 8 cm.  Paragard  IUD placed per manufacturer's recommendations. The strings were trimmed to approximately 3 cm. The patient tolerated the procedure well.   Informal transvaginal sonogram was performed and the proper placement of the IUD was verified.  Chaperone: me   Assessment & Plan:   1) Paragard IUD insertion The patient was given post procedure instructions, including signs and symptoms of infection and to check  for the strings after each menses or each month, and refraining from intercourse or anything in the vagina for 3 days. She was given a care card with date IUD placed, and date IUD to be removed. She is scheduled for a f/u appointment in 4 weeks.  Orders Placed This Encounter  Procedures  . POCT urine pregnancy    Return in about 4 weeks (around 11/23/2019) for F/U, MyChart Video.  [place], [facility] 10/26/2019 11:43 AM

## 2019-11-23 ENCOUNTER — Other Ambulatory Visit: Payer: Self-pay

## 2019-11-23 ENCOUNTER — Encounter: Payer: Self-pay | Admitting: Women's Health

## 2019-11-23 ENCOUNTER — Telehealth (INDEPENDENT_AMBULATORY_CARE_PROVIDER_SITE_OTHER): Payer: BC Managed Care – PPO | Admitting: Women's Health

## 2019-11-23 DIAGNOSIS — Z30431 Encounter for routine checking of intrauterine contraceptive device: Secondary | ICD-10-CM

## 2019-11-23 NOTE — Progress Notes (Signed)
   [facility] VIRTUAL GYN VISIT ENCOUNTER NOTE Patient name: Donna Kim MRN [id number]  Date of birth: 1981-05-02  I connected with patient on 11/23/19 at 11:10 AM EST by MyChart video  and verified that I am speaking with the correct person using two identifiers.  Due to COVID-19 recommendations, pt is not currently in the office.    I discussed the limitations, risks, security and privacy concerns of performing an evaluation and management service by telephone and the availability of in person appointments. I also discussed with the patient that there may be a patient responsible charge related to this service. The patient expressed understanding and agreed to proceed.   Chief Complaint:   Follow-up (IUD Check)  History of Present Illness:   Donna Kim is a 38 y.o. G21P4004 Caucasian female being evaluated today for IUD check.  Paragard IUD inserted 10/26/19.  Doing great. Able to feel strings, checked this morning. Bled pretty heavy after insertion, none now. No pain or pain w/ sex. Migraines are returning. Had them during pregnancy, was seen by neurologist in ER who recommended f/u w/ neuro if continued. She states they initially resolved, but have now returned, so she plans to call and schedule f/u w/ neuro. Stopping zoloft, feels much better and it was decreasing her libido.   No LMP recorded. (Menstrual status: IUD). The current method of family planning is IUD.  Last pap 04/14/17. Results were:  normal Review of Systems:   Pertinent items are noted in HPI Denies fever/chills, dizziness, headaches, visual disturbances, fatigue, shortness of breath, chest pain, abdominal pain, vomiting, abnormal vaginal discharge/itching/odor/irritation, problems with periods, bowel movements, urination, or intercourse unless otherwise stated above.  Pertinent History Reviewed:  Reviewed past medical,surgical, social, obstetrical and family history.  Reviewed problem list, medications and  allergies. Physical Assessment:  There were no vitals filed for this visit.There is no height or weight on file to calculate BMI.       Physical Examination:   General:  Alert, oriented and cooperative.   Mental Status: Normal mood and affect perceived. Normal judgment and thought content.  Physical exam deferred due to nature of the encounter  No results found for this or any previous visit (from the past 24 hour(s)).  Assessment & Plan:  1) IUD check> doing well, able to feel strings  2) Migraines> pt to call to schedule f/u w/ neurologist  Meds: No orders of the defined types were placed in this encounter.   No orders of the defined types were placed in this encounter.   I discussed the assessment and treatment plan with the patient. The patient was provided an opportunity to ask questions and all were answered. The patient agreed with the plan and demonstrated an understanding of the instructions.   The patient was advised to call back or seek an in-person evaluation/go to the ED if the symptoms worsen or if the condition fails to improve as anticipated.  I provided 15 minutes of non-face-to-face time during this encounter.   Return in about 6 months (around 05/23/2020) for Pap & physical.  Roma Schanz CNM, [facility] 11/23/2019 11:14 AM

## 2019-12-23 ENCOUNTER — Ambulatory Visit (INDEPENDENT_AMBULATORY_CARE_PROVIDER_SITE_OTHER): Payer: Medicaid Other | Admitting: Advanced Practice Midwife

## 2019-12-23 ENCOUNTER — Encounter: Payer: Self-pay | Admitting: Advanced Practice Midwife

## 2019-12-23 ENCOUNTER — Other Ambulatory Visit: Payer: Self-pay

## 2019-12-23 VITALS — BP 130/81 | HR 83 | Ht 65.0 in | Wt 241.8 lb

## 2019-12-23 DIAGNOSIS — Z0000 Encounter for general adult medical examination without abnormal findings: Secondary | ICD-10-CM

## 2019-12-23 DIAGNOSIS — G43009 Migraine without aura, not intractable, without status migrainosus: Secondary | ICD-10-CM

## 2019-12-23 NOTE — Progress Notes (Signed)
GYNECOLOGY FOLLOWUP ENCOUNTER NOTE  History:     Donna Kim is a 39 y.o. G18P4004 female here for a routine annual gynecologic exam.  Current complaints: recurrent complex migraine. Patient was previously evaluated by her neurologist and encouraged to follow-up once postpartum. During her postpartum period she experienced a reprieve from her migraines and her previous referral expired. She presents today requesting a new referral so that she may continue her plan of care with Neurology. Denies abnormal vaginal bleeding, discharge, pelvic pain, problems with intercourse or other gynecologic concerns.    Gynecologic History No LMP recorded. (Menstrual status: IUD). Contraception: IUD Last Pap: 04/2017. Results were: normal with negative HPV Last mammogram: N/A age  Obstetric History OB History  Gravida Para Term Preterm AB Living  4 4 4     4   SAB TAB Ectopic Multiple Live Births        0 4    # Outcome Date GA Lbr Len/2nd Weight Sex Delivery Anes PTL Lv  4 Term 09/19/19 [redacted]w[redacted]d 05:32 / 00:08 8 lb 0.4 oz (3.64 kg) M Vag-Spont EPI  LIV  3 Term 10/25/17 [redacted]w[redacted]d 01:44 / 00:06 5 lb 10.8 oz (2.574 kg) F Vag-Spont EPI  LIV  2 Term 07/04/14 [redacted]w[redacted]d 01:59 / 00:08 8 lb 15.4 oz (4.065 kg) F Vag-Spont None N LIV  1 Term 01/02/12 [redacted]w[redacted]d 10:50 / 05:14 7 lb 2.6 oz (3.249 kg) F Vag-Spont EPI N LIV     Birth Comments: none    Past Medical History:  Diagnosis Date  . ADHD   . Back pain 11/12/2013  . Cholestasis 08/2017  . Contraceptive management 08/12/2014  . Headache   . Mental disorder    anxiety  . No pertinent past medical history   . Postpartum bleeding 07/15/2014  . Rosacea   . UTI (urinary tract infection)   . Vaginal Pap smear, abnormal     Past Surgical History:  Procedure Laterality Date  . WISDOM TOOTH EXTRACTION      Current Outpatient Medications on File Prior to Visit  Medication Sig Dispense Refill  . Acetaminophen (TYLENOL PO) Take by mouth.    . Cholecalciferol  (VITAMIN D3 PO) Take by mouth.    Marland Kitchen MAGNESIUM PO Take by mouth.    . Omega-3 Fatty Acids (OMEGA 3 PO) Take by mouth.    Marland Kitchen PARAGARD INTRAUTERINE COPPER IU by Intrauterine route.    . Prenatal Vit-Fe Fumarate-FA (PRENATAL PO) Take by mouth.    . Probiotic Product (PROBIOTIC PO) Take by mouth.     No current facility-administered medications on file prior to visit.    Allergies  Allergen Reactions  . Cefzil [Cefprozil] Itching and Nausea And Vomiting    Social History:  reports that she has never smoked. She has never used smokeless tobacco. She reports that she does not drink alcohol or use drugs.  Family History  Adopted: Yes  Problem Relation Age of Onset  . ADD / ADHD Daughter     The following portions of the patient's history were reviewed and updated as appropriate: allergies, current medications, past family history, past medical history, past social history, past surgical history and problem list.  Review of Systems Pertinent items noted in HPI and remainder of comprehensive ROS otherwise negative.  Physical Exam:  BP 130/81 (BP Location: Right Arm, Patient Position: Sitting, Cuff Size: Large)   Pulse 83   Ht 5\' 5"  (1.651 m)   Wt 241 lb 12.8 oz (109.7 kg)   Breastfeeding  Yes   BMI 40.24 kg/m  CONSTITUTIONAL: Well-developed, well-nourished female in no acute distress.  HENT:  Normocephalic, atraumatic, External right and left ear normal. Oropharynx is clear and moist EYES: Conjunctivae and EOM are normal. Pupils are equal, round, and reactive to light. No scleral icterus.     SKIN: Skin is warm and dry. No rash noted. Not diaphoretic. No erythema. No pallor. MUSCULOSKELETAL: Normal range of motion. No tenderness.  No cyanosis, clubbing, or edema.  2+ distal pulses. NEUROLOGIC: Alert and oriented to person, place, and time. Normal reflexes, muscle tone coordination.  PSYCHIATRIC: Normal mood and affect. Normal behavior. Normal judgment and thought content.  CARDIOVASCULAR: Normal heart rate noted, regular rhythm RESPIRATORY: Effort normal, no problems with respiration noted.    Assessment and Plan:    1. Migraine without aura and without status migrainosus, not intractable - Ambulatory referral to Neurology  Routine preventative health maintenance measures emphasized. Please refer to After Visit Summary for other counseling recommendations.      Total visit time 15 minutes. Greater than 50% of visit spent in counseling and coordination of care  Mallie Snooks, MSN, CNM Certified Nurse Midwife, [employer] for [employer], [place] 12/23/19 8:40 PM

## 2019-12-23 NOTE — Patient Instructions (Signed)
Preventive Care 21-37 Years Old, Female Preventive care refers to visits with your health care provider and lifestyle choices that can promote health and wellness. This includes:  A yearly physical exam. This may also be called an annual well check.  Regular dental visits and eye exams.  Immunizations.  Screening for certain conditions.  Healthy lifestyle choices, such as eating a healthy diet, getting regular exercise, not using drugs or products that contain nicotine and tobacco, and limiting alcohol use. What can I expect for my preventive care visit? Physical exam Your health care provider will check your:  Height and weight. This may be used to calculate body mass index (BMI), which tells if you are at a healthy weight.  Heart rate and blood pressure.  Skin for abnormal spots. Counseling Your health care provider may ask you questions about your:  Alcohol, tobacco, and drug use.  Emotional well-being.  Home and relationship well-being.  Sexual activity.  Eating habits.  Work and work environment.  Method of birth control.  Menstrual cycle.  Pregnancy history. What immunizations do I need?  Influenza (flu) vaccine  This is recommended every year. Tetanus, diphtheria, and pertussis (Tdap) vaccine  You may need a Td booster every 10 years. Varicella (chickenpox) vaccine  You may need this if you have not been vaccinated. Human papillomavirus (HPV) vaccine  If recommended by your health care provider, you may need three doses over 6 months. Measles, mumps, and rubella (MMR) vaccine  You may need at least one dose of MMR. You may also need a second dose. Meningococcal conjugate (MenACWY) vaccine  One dose is recommended if you are age 19-21 years and a first-year college student living in a residence hall, or if you have one of several medical conditions. You may also need additional booster doses. Pneumococcal conjugate (PCV13) vaccine  You may need  this if you have certain conditions and were not previously vaccinated. Pneumococcal polysaccharide (PPSV23) vaccine  You may need one or two doses if you smoke cigarettes or if you have certain conditions. Hepatitis A vaccine  You may need this if you have certain conditions or if you travel or work in places where you may be exposed to hepatitis A. Hepatitis B vaccine  You may need this if you have certain conditions or if you travel or work in places where you may be exposed to hepatitis B. Haemophilus influenzae type b (Hib) vaccine  You may need this if you have certain conditions. You may receive vaccines as individual doses or as more than one vaccine together in one shot (combination vaccines). Talk with your health care provider about the risks and benefits of combination vaccines. What tests do I need?  Blood tests  Lipid and cholesterol levels. These may be checked every 5 years starting at age 20.  Hepatitis C test.  Hepatitis B test. Screening  Diabetes screening. This is done by checking your blood sugar (glucose) after you have not eaten for a while (fasting).  Sexually transmitted disease (STD) testing.  BRCA-related cancer screening. This may be done if you have a family history of breast, ovarian, tubal, or peritoneal cancers.  Pelvic exam and Pap test. This may be done every 3 years starting at age 21. Starting at age 30, this may be done every 5 years if you have a Pap test in combination with an HPV test. Talk with your health care provider about your test results, treatment options, and if necessary, the need for more tests.   Follow these instructions at home: Eating and drinking   Eat a diet that includes fresh fruits and vegetables, whole grains, lean protein, and low-fat dairy.  Take vitamin and mineral supplements as recommended by your health care provider.  Do not drink alcohol if: ? Your health care provider tells you not to drink. ? You are  pregnant, may be pregnant, or are planning to become pregnant.  If you drink alcohol: ? Limit how much you have to 0-1 drink a day. ? Be aware of how much alcohol is in your drink. In the [place] one drink equals one 12 oz bottle of beer (355 mL), one 5 oz glass of wine (148 mL), or one 1 oz glass of hard liquor (44 mL). Lifestyle  Take daily care of your teeth and gums.  Stay active. Exercise for at least 30 minutes on 5 or more days each week.  Do not use any products that contain nicotine or tobacco, such as cigarettes, e-cigarettes, and chewing tobacco. If you need help quitting, ask your health care provider.  If you are sexually active, practice safe sex. Use a condom or other form of birth control (contraception) in order to prevent pregnancy and STIs (sexually transmitted infections). If you plan to become pregnant, see your health care provider for a preconception visit. What's next?  Visit your health care provider once a year for a well check visit.  Ask your health care provider how often you should have your eyes and teeth checked.  Stay up to date on all vaccines. This information is not intended to replace advice given to you by your health care provider. Make sure you discuss any questions you have with your health care provider. Document Revised: 07/30/2018 Document Reviewed: 07/30/2018 Elsevier Patient Education  2020 [employer].

## 2019-12-29 ENCOUNTER — Ambulatory Visit: Payer: Medicaid Other | Attending: Internal Medicine

## 2019-12-29 ENCOUNTER — Other Ambulatory Visit: Payer: Self-pay

## 2019-12-29 ENCOUNTER — Other Ambulatory Visit: Payer: Self-pay | Admitting: Advanced Practice Midwife

## 2019-12-29 DIAGNOSIS — Z20822 Contact with and (suspected) exposure to covid-19: Secondary | ICD-10-CM

## 2019-12-30 ENCOUNTER — Encounter: Payer: Self-pay | Admitting: Neurology

## 2019-12-30 LAB — NOVEL CORONAVIRUS, NAA: SARS-CoV-2, NAA: DETECTED — AB

## 2019-12-31 ENCOUNTER — Other Ambulatory Visit: Payer: Medicaid Other

## 2019-12-31 ENCOUNTER — Telehealth: Payer: Self-pay | Admitting: Nurse Practitioner

## 2019-12-31 ENCOUNTER — Other Ambulatory Visit: Payer: Self-pay | Admitting: Nurse Practitioner

## 2019-12-31 DIAGNOSIS — U071 COVID-19: Secondary | ICD-10-CM

## 2019-12-31 DIAGNOSIS — E6609 Other obesity due to excess calories: Secondary | ICD-10-CM

## 2019-12-31 NOTE — Progress Notes (Signed)
No show

## 2019-12-31 NOTE — Telephone Encounter (Signed)
Called to Discuss with patient about Covid symptoms and the use of bamlanivimab, a monoclonal antibody infusion for those with mild to moderate Covid symptoms and at a high risk of hospitalization.     Pt is qualified for this infusion at the [facility] infusion center due to co-morbid conditions and/or a member of an at-risk group.     Patient Active Problem List   Diagnosis Date Noted  . Encounter for IUD insertion 10/26/2019  . Migraine with aura 08/19/2019  . Gestational thrombocytopenia ([place]) 06/22/2019  . History of postpartum hypertension 10/30/2017  . History of cholestasis during pregnancy 08/28/2017  . Depression with anxiety 04/14/2017  . ADHD (attention deficit hyperactivity disorder), inattentive type 10/02/2014  . Rosacea, acne 03/18/2014    Patient would like to think about it at this time. Symptoms tier reviewed as well as criteria for ending isolation. Preventative practices reviewed. Patient verbalized understanding.    Patient advised to call back if he decides that he does want to get infusion. Callback number to the infusion center given. Patient advised to go to Urgent care or ED with severe symptoms.

## 2019-12-31 NOTE — Progress Notes (Signed)
  I connected by phone with Donna Kim on 12/31/2019 at 10:39 AM to discuss the potential use of an new treatment for mild to moderate COVID-19 viral infection in non-hospitalized patients.  This patient is a 39 y.o. female that meets the FDA criteria for Emergency Use Authorization of bamlanivimab or casirivimab\imdevimab.  Has a (+) direct SARS-CoV-2 viral test result  Has mild or moderate COVID-19   Is ? 37 years of age and weighs ? 40 kg  Is NOT hospitalized due to COVID-19  Is NOT requiring oxygen therapy or requiring an increase in baseline oxygen flow rate due to COVID-19  Is within 10 days of symptom onset  Has at least one of the high risk factor(s) for progression to severe COVID-19 and/or hospitalization as defined in EUA.  Specific high risk criteria : BMI >/= 35   I have spoken and communicated the following to the patient or parent/caregiver:  1. FDA has authorized the emergency use of bamlanivimab and casirivimab\imdevimab for the treatment of mild to moderate COVID-19 in adults and pediatric patients with positive results of direct SARS-CoV-2 viral testing who are 79 years of age and older weighing at least 40 kg, and who are at high risk for progressing to severe COVID-19 and/or hospitalization.  2. The significant known and potential risks and benefits of bamlanivimab and casirivimab\imdevimab, and the extent to which such potential risks and benefits are unknown.  3. Information on available alternative treatments and the risks and benefits of those alternatives, including clinical trials.  4. Patients treated with bamlanivimab and casirivimab\imdevimab should continue to self-isolate and use infection control measures (e.g., wear mask, isolate, social distance, avoid sharing personal items, clean and disinfect "high touch" surfaces, and frequent handwashing) according to CDC guidelines.   5. The patient or parent/caregiver has the option to accept or refuse  bamlanivimab or casirivimab\imdevimab .  After reviewing this information with the patient, The patient agreed to proceed with receiving the bamlanimivab infusion and will be provided a copy of the Fact sheet prior to receiving the infusion.Fenton Foy 12/31/2019 10:39 AM

## 2020-01-02 ENCOUNTER — Ambulatory Visit (HOSPITAL_COMMUNITY)
Admission: RE | Admit: 2020-01-02 | Discharge: 2020-01-02 | Disposition: A | Discharge status: Home / Self Care | Payer: 59 | Source: Ambulatory Visit | Attending: Pulmonary Disease | Admitting: Pulmonary Disease

## 2020-01-02 DIAGNOSIS — E6609 Other obesity due to excess calories: Secondary | ICD-10-CM | POA: Diagnosis not present

## 2020-01-02 DIAGNOSIS — U071 COVID-19: Secondary | ICD-10-CM | POA: Insufficient documentation

## 2020-01-02 DIAGNOSIS — Z6839 Body mass index (BMI) 39.0-39.9, adult: Secondary | ICD-10-CM | POA: Insufficient documentation

## 2020-01-02 DIAGNOSIS — Z23 Encounter for immunization: Secondary | ICD-10-CM | POA: Diagnosis present

## 2020-01-02 MED ORDER — SODIUM CHLORIDE 0.9 % IV SOLN
700.0000 mg | Freq: Once | INTRAVENOUS | Status: AC
  Administered 2020-01-02: 700 mg via INTRAVENOUS
  Filled 2020-01-02: qty 20

## 2020-01-02 MED ORDER — FAMOTIDINE IN NACL 20-0.9 MG/50ML-% IV SOLN: 20.0000 mg | Freq: Once | INTRAVENOUS | Status: DC | PRN

## 2020-01-02 MED ORDER — SODIUM CHLORIDE 0.9 % IV SOLN
INTRAVENOUS | Status: DC | PRN
  Administered 2020-01-02: 250 mL via INTRAVENOUS

## 2020-01-02 MED ORDER — METHYLPREDNISOLONE SODIUM SUCC 125 MG IJ SOLR: 125.0000 mg | Freq: Once | INTRAMUSCULAR | Status: DC | PRN

## 2020-01-02 MED ORDER — DIPHENHYDRAMINE HCL 50 MG/ML IJ SOLN: 50.0000 mg | Freq: Once | INTRAMUSCULAR | Status: DC | PRN

## 2020-01-02 MED ORDER — EPINEPHRINE 0.3 MG/0.3ML IJ SOAJ: 0.3000 mg | Freq: Once | INTRAMUSCULAR | Status: DC | PRN

## 2020-01-02 MED ORDER — ALBUTEROL SULFATE HFA 108 (90 BASE) MCG/ACT IN AERS: 2.0000 | INHALATION_SPRAY | Freq: Once | RESPIRATORY_TRACT | Status: DC | PRN

## 2020-01-02 NOTE — Progress Notes (Signed)
  Diagnosis: COVID-19  Physician: Dr. Joya Gaskins Procedure: Covid Infusion Clinic Med: bamlanivimab infusion - Provided patient with bamlanimivab fact sheet for patients, parents and caregivers prior to infusion.  Complications: No immediate complications noted.  Discharge: Discharged home   [place] 01/02/2020

## 2020-01-02 NOTE — Discharge Instructions (Signed)
COVID-19 COVID-19 is a respiratory infection that is caused by a virus called severe acute respiratory syndrome coronavirus 2 (SARS-CoV-2). The disease is also known as coronavirus disease or novel coronavirus. In some people, the virus may not cause any symptoms. In others, it may cause a serious infection. The infection can get worse quickly and can lead to complications, such as:  Pneumonia, or infection of the lungs.  Acute respiratory distress syndrome or ARDS. This is a condition in which fluid build-up in the lungs prevents the lungs from filling with air and passing oxygen into the blood.  Acute respiratory failure. This is a condition in which there is not enough oxygen passing from the lungs to the body or when carbon dioxide is not passing from the lungs out of the body.  Sepsis or septic shock. This is a serious bodily reaction to an infection.  Blood clotting problems.  Secondary infections due to bacteria or fungus.  Organ failure. This is when your body's organs stop working. The virus that causes COVID-19 is contagious. This means that it can spread from person to person through droplets from coughs and sneezes (respiratory secretions). What are the causes? This illness is caused by a virus. You may catch the virus by:  Breathing in droplets from an infected person. Droplets can be spread by a person breathing, speaking, singing, coughing, or sneezing.  Touching something, like a table or a doorknob, that was exposed to the virus (contaminated) and then touching your mouth, nose, or eyes. What increases the risk? Risk for infection You are more likely to be infected with this virus if you:  Are within 6 feet (2 meters) of a person with COVID-19.  Provide care for or live with a person who is infected with COVID-19.  Spend time in crowded indoor spaces or live in shared housing. Risk for serious illness You are more likely to become seriously ill from the virus if  you:  Are 50 years of age or older. The higher your age, the more you are at risk for serious illness.  Live in a nursing home or long-term care facility.  Have cancer.  Have a long-term (chronic) disease such as: ? Chronic lung disease, including chronic obstructive pulmonary disease or asthma. ? A long-term disease that lowers your body's ability to fight infection (immunocompromised). ? Heart disease, including heart failure, a condition in which the arteries that lead to the heart become narrow or blocked (coronary artery disease), a disease which makes the heart muscle thick, weak, or stiff (cardiomyopathy). ? Diabetes. ? Chronic kidney disease. ? Sickle cell disease, a condition in which red blood cells have an abnormal "sickle" shape. ? Liver disease.  Are obese. What are the signs or symptoms? Symptoms of this condition can range from mild to severe. Symptoms may appear any time from 2 to 14 days after being exposed to the virus. They include:  A fever or chills.  A cough.  Difficulty breathing.  Headaches, body aches, or muscle aches.  Runny or stuffy (congested) nose.  A sore throat.  New loss of taste or smell. Some people may also have stomach problems, such as nausea, vomiting, or diarrhea. Other people may not have any symptoms of COVID-19. How is this diagnosed? This condition may be diagnosed based on:  Your signs and symptoms, especially if: ? You live in an area with a COVID-19 outbreak. ? You recently traveled to or from an area where the virus is common. ? You   provide care for or live with a person who was diagnosed with COVID-19. ? You were exposed to a person who was diagnosed with COVID-19.  A physical exam.  Lab tests, which may include: ? Taking a sample of fluid from the back of your nose and throat (nasopharyngeal fluid), your nose, or your throat using a swab. ? A sample of mucus from your lungs (sputum). ? Blood tests.  Imaging tests,  which may include, X-rays, CT scan, or ultrasound. How is this treated? At present, there is no medicine to treat COVID-19. Medicines that treat other diseases are being used on a trial basis to see if they are effective against COVID-19. Your health care provider will talk with you about ways to treat your symptoms. For most people, the infection is mild and can be managed at home with rest, fluids, and over-the-counter medicines. Treatment for a serious infection usually takes places in a hospital intensive care unit (ICU). It may include one or more of the following treatments. These treatments are given until your symptoms improve.  Receiving fluids and medicines through an IV.  Supplemental oxygen. Extra oxygen is given through a tube in the nose, a face mask, or a hood.  Positioning you to lie on your stomach (prone position). This makes it easier for oxygen to get into the lungs.  Continuous positive airway pressure (CPAP) or bi-level positive airway pressure (BPAP) machine. This treatment uses mild air pressure to keep the airways open. A tube that is connected to a motor delivers oxygen to the body.  Ventilator. This treatment moves air into and out of the lungs by using a tube that is placed in your windpipe.  Tracheostomy. This is a procedure to create a hole in the neck so that a breathing tube can be inserted.  Extracorporeal membrane oxygenation (ECMO). This procedure gives the lungs a chance to recover by taking over the functions of the heart and lungs. It supplies oxygen to the body and removes carbon dioxide. Follow these instructions at home: Lifestyle  If you are sick, stay home except to get medical care. Your health care provider will tell you how long to stay home. Call your health care provider before you go for medical care.  Rest at home as told by your health care provider.  Do not use any products that contain nicotine or tobacco, such as cigarettes,  e-cigarettes, and chewing tobacco. If you need help quitting, ask your health care provider.  Return to your normal activities as told by your health care provider. Ask your health care provider what activities are safe for you. General instructions  Take over-the-counter and prescription medicines only as told by your health care provider.  Drink enough fluid to keep your urine pale yellow.  Keep all follow-up visits as told by your health care provider. This is important. How is this prevented?  There is no vaccine to help prevent COVID-19 infection. However, there are steps you can take to protect yourself and others from this virus. To protect yourself:   Do not travel to areas where COVID-19 is a risk. The areas where COVID-19 is reported change often. To identify high-risk areas and travel restrictions, check the CDC travel website: wwwnc.cdc.gov/travel/notices  If you live in, or must travel to, an area where COVID-19 is a risk, take precautions to avoid infection. ? Stay away from people who are sick. ? Wash your hands often with soap and water for 20 seconds. If soap and water   are not available, use an alcohol-based hand sanitizer. ? Avoid touching your mouth, face, eyes, or nose. ? Avoid going out in public, follow guidance from your state and local health authorities. ? If you must go out in public, wear a cloth face covering or face mask. Make sure your mask covers your nose and mouth. ? Avoid crowded indoor spaces. Stay at least 6 feet (2 meters) away from others. ? Disinfect objects and surfaces that are frequently touched every day. This may include:  Counters and tables.  Doorknobs and light switches.  Sinks and faucets.  Electronics, such as phones, remote controls, keyboards, computers, and tablets. To protect others: If you have symptoms of COVID-19, take steps to prevent the virus from spreading to others.  If you think you have a COVID-19 infection, contact  your health care provider right away. Tell your health care team that you think you may have a COVID-19 infection.  Stay home. Leave your house only to seek medical care. Do not use public transport.  Do not travel while you are sick.  Wash your hands often with soap and water for 20 seconds. If soap and water are not available, use alcohol-based hand sanitizer.  Stay away from other members of your household. Let healthy household members care for children and pets, if possible. If you have to care for children or pets, wash your hands often and wear a mask. If possible, stay in your own room, separate from others. Use a different bathroom.  Make sure that all people in your household wash their hands well and often.  Cough or sneeze into a tissue or your sleeve or elbow. Do not cough or sneeze into your hand or into the air.  Wear a cloth face covering or face mask. Make sure your mask covers your nose and mouth. Where to find more information  Centers for Disease Control and Prevention: www.cdc.gov/coronavirus/2019-ncov/index.html  World Health Organization: www.who.int/health-topics/coronavirus Contact a health care provider if:  You live in or have traveled to an area where COVID-19 is a risk and you have symptoms of the infection.  You have had contact with someone who has COVID-19 and you have symptoms of the infection. Get help right away if:  You have trouble breathing.  You have pain or pressure in your chest.  You have confusion.  You have bluish lips and fingernails.  You have difficulty waking from sleep.  You have symptoms that get worse. These symptoms may represent a serious problem that is an emergency. Do not wait to see if the symptoms will go away. Get medical help right away. Call your local emergency services (911 in the [place] Do not drive yourself to the hospital. Let the emergency medical personnel know if you think you have  COVID-19. Summary  COVID-19 is a respiratory infection that is caused by a virus. It is also known as coronavirus disease or novel coronavirus. It can cause serious infections, such as pneumonia, acute respiratory distress syndrome, acute respiratory failure, or sepsis.  The virus that causes COVID-19 is contagious. This means that it can spread from person to person through droplets from breathing, speaking, singing, coughing, or sneezing.  You are more likely to develop a serious illness if you are 50 years of age or older, have a weak immune system, live in a nursing home, or have chronic disease.  There is no medicine to treat COVID-19. Your health care provider will talk with you about ways to treat your symptoms.    Take steps to protect yourself and others from infection. Wash your hands often and disinfect objects and surfaces that are frequently touched every day. Stay away from people who are sick and wear a mask if you are sick. This information is not intended to replace advice given to you by your health care provider. Make sure you discuss any questions you have with your health care provider. Document Revised: 09/17/2019 Document Reviewed: 12/24/2018 Elsevier Patient Education  2020 Elsevier Inc. What types of side effects do monoclonal antibody drugs cause?  Common side effects  In general, the more common side effects caused by monoclonal antibody drugs include: . Allergic reactions, such as hives or itching . Flu-like signs and symptoms, including chills, fatigue, fever, and muscle aches and pains . Nausea, vomiting . Diarrhea . Skin rashes . Low blood pressure   The CDC is recommending patients who receive monoclonal antibody treatments wait at least 90 days before being vaccinated.  Currently, there are no data on the safety and efficacy of mRNA COVID-19 vaccines in persons who received monoclonal antibodies or convalescent plasma as part of COVID-19 treatment. Based  on the estimated half-life of such therapies as well as evidence suggesting that reinfection is uncommon in the 90 days after initial infection, vaccination should be deferred for at least 90 days, as a precautionary measure until additional information becomes available, to avoid interference of the antibody treatment with vaccine-induced immune responses. 

## 2020-01-03 ENCOUNTER — Encounter: Payer: Self-pay | Admitting: Family Medicine

## 2020-01-03 ENCOUNTER — Other Ambulatory Visit: Payer: Self-pay

## 2020-01-03 ENCOUNTER — Telehealth (INDEPENDENT_AMBULATORY_CARE_PROVIDER_SITE_OTHER): Payer: Medicaid Other | Admitting: Neurology

## 2020-01-03 VITALS — Ht 65.0 in | Wt 238.0 lb

## 2020-01-03 DIAGNOSIS — Z5329 Procedure and treatment not carried out because of patient's decision for other reasons: Secondary | ICD-10-CM

## 2020-01-06 ENCOUNTER — Ambulatory Visit (INDEPENDENT_AMBULATORY_CARE_PROVIDER_SITE_OTHER): Payer: Medicaid Other | Admitting: Family Medicine

## 2020-01-06 ENCOUNTER — Other Ambulatory Visit: Payer: Self-pay

## 2020-01-06 DIAGNOSIS — H00033 Abscess of eyelid right eye, unspecified eyelid: Secondary | ICD-10-CM | POA: Diagnosis not present

## 2020-01-06 DIAGNOSIS — U071 COVID-19: Secondary | ICD-10-CM | POA: Diagnosis not present

## 2020-01-06 MED ORDER — AZITHROMYCIN 250 MG PO TABS: ORAL_TABLET | ORAL | 0 refills | Status: DC

## 2020-01-06 MED ORDER — SULFACETAMIDE SODIUM 10 % OP SOLN: 2.0000 [drp] | Freq: Four times a day (QID) | OPHTHALMIC | 0 refills | Status: DC

## 2020-01-06 NOTE — Progress Notes (Signed)
   Subjective:  Audiovideo  Patient ID: Donna Kim, female    DOB: 06/05/1981, 39 y.o.   MRN: [id number]  HPI  Patient calls with stye on her right eye for several days. Patient also currently has Covid  Patient tested positive for Covid 12/29/19 and had antibody infusion on Sunday 01/02/20 79 month old and 39 year old also positive for Covid  Virtual Visit via Video Note  I connected with Donna Kim on 01/06/20 at  4:10 PM EST by a video enabled telemedicine application and verified that I am speaking with the correct person using two identifiers.  Location: Patient: home Provider: office   I discussed the limitations of evaluation and management by telemedicine and the availability of in person appointments. The patient expressed understanding and agreed to proceed.  History of Present Illness:    Observations/Objective:   Assessment and Plan:   Follow Up Instructions:    I discussed the assessment and treatment plan with the patient. The patient was provided an opportunity to ask questions and all were answered. The patient agreed with the plan and demonstrated an understanding of the instructions.   The patient was advised to call back or seek an in-person evaluation if the symptoms worsen or if the condition fails to improve as anticipated.  I provided 20 minutes of non-face-to-face time during this encounter. Patient has a respiratory illness with her COVID-19.  Has a bit of a cough.  Generally not productive.  No fever the last 2 days.  Did receive monoclonal antibodies this weekend due to her obesity.  Is developed a stye right upper eyelid.  Tender sore swollen inflamed notes  Review of Systems See above    Objective:   Physical Exam  Virtual however camera did reveal upper eyelid that was quite erythematous and swollen      Assessment & Plan:  Impression stye with secondary element of upper eyelid cellulitis.  Also complicated by current Covid.  We  will cover with an antibiotic that would cover both eyelid cellulitis and secondary bronchial infection just in case symptom care discussed warning signs discussed antibiotic eyedrops also

## 2020-01-07 ENCOUNTER — Encounter: Payer: Self-pay | Admitting: Family Medicine

## 2020-02-10 ENCOUNTER — Encounter: Payer: Self-pay | Admitting: Neurology

## 2020-02-10 NOTE — Progress Notes (Signed)
Virtual Visit via Video Note The purpose of this virtual visit is to provide medical care while limiting exposure to the novel coronavirus.    Consent was obtained for video visit:  Yes.   Answered questions that patient had about telehealth interaction:  Yes.   I discussed the limitations, risks, security and privacy concerns of performing an evaluation and management service by telemedicine. I also discussed with the patient that there may be a patient responsible charge related to this service. The patient expressed understanding and agreed to proceed.  Pt location: Home Physician Location: office Name of referring provider:  Mikey Kirschner, MD I connected with Donna Kim at patients initiation/request on 02/11/2020 at  2:50 PM EST by video enabled telemedicine application and verified that I am speaking with the correct person using two identifiers. Pt MRN:  [id number] Pt DOB:  27-Mar-1981 Video Participants:  Donna Kim   History of Present Illness:  Donna Kim is a 39 year old female who presents for migraines.  History supplemented by ED and referring provider note.  MRI/MRV personally reviewed.  She started having migraines with headache, photophobia and phonophobia in 2010 but responded to chiropractic therapy.  Once in a while, she would have an ocular migraine consisting of scintillating fortification described as jagged geometric shapes of pulsating color in the center of her vision that would slowly move out of of view, lasting 30 minutes.  Sometimes a headache may follow but not always.  In August, during her third trimester, she began having frequent episodes of ocular migraine.  She would also have other visual aura described as fireworks.  She developed trouble talking, felt off-balance and dizziness.  MRI and MRV of the head without contrast on 07/27/2019 was personally reviewed and showed nonspecific scattered punctate subcortical hyperintensities but no acute  intracranial abnormality and MRV was negative for dural sinus thrombosis.  Symptoms calmed down and she gave birth to her son on October 18.  She had her copper IUD placed in late November.  In December, she began experiencing daily to near daily headaches.  They have since reduced in frequency, occurring 1 to 2 times a week and lasting 2 to 3 hours.  They are moderate to severe right sided headaches, either can be pounding, pressure, sharp/stabbing pain with right eye lacrimation, photophobia, phonophobia but not much nausea.  Question whether they are hormonal as they were aggravated following IUD.  Fioricet is helpful.    She was breastfeeding but has recently almost stopped.  She may still breastfeed in the middle of the night.     Current NSAIDS:  ibuprofen Current analgesics:  acetaminophen Current triptans:  none Current ergotamine:  none Current anti-emetic:  none Current muscle relaxants:  none Current anti-anxiolytic:  none Current sleep aide:  none Current Antihypertensive medications:  none Current Antidepressant medications:  none Current Anticonvulsant medications:  none Current anti-CGRP:  none Current Vitamins/Herbal/Supplements:  Magnesium, prenatal, D3, probiotic, omega-3 Current Antihistamines/Decongestants:  none Other therapy:  none Hormone/birth control:  none Other medications:  none  Past NSAIDS:  none Past analgesics:  Fioricet Past abortive triptans:  none Past abortive ergotamine:  none Past muscle relaxants:  none Past anti-emetic:  Zofran ODT 8mg ; Promethazine 25mg  Past antihypertensive medications:  amlodipine Past antidepressant medications:  Sertraline 50mg  Past anticonvulsant medications:  none Past anti-CGRP:  none Past vitamins/Herbal/Supplements:  riboflavin Past antihistamines/decongestants:  meclizine Other past therapies:  none  Caffeine:  Rarely coffee.  Rarely soda Diet:  Trying to  increase water intake.  Rarely soda.  Sometimes skips  meals.  Snacks. Exercise:  no Depression:  no; Anxiety:  some Other pain:  no Sleep hygiene:  Not great.  Her baby still wakes up insomnia Family history of headache:  Unknown (adopted)  Past Medical History: Past Medical History:  Diagnosis Date  . ADHD   . Back pain 11/12/2013  . Cholestasis 08/2017  . Contraceptive management 08/12/2014  . Headache   . Mental disorder    anxiety  . No pertinent past medical history   . Postpartum bleeding 07/15/2014  . Rosacea   . UTI (urinary tract infection)   . Vaginal Pap smear, abnormal     Medications: Outpatient Encounter Medications as of 02/11/2020  Medication Sig  . Acetaminophen (TYLENOL PO) Take by mouth.  Marland Kitchen azithromycin (ZITHROMAX Z-PAK) 250 MG tablet Take 2 tablets (500 mg) on  Day 1,  followed by 1 tablet (250 mg) once daily on Days 2 through 5.  . Cholecalciferol (VITAMIN D3 PO) Take by mouth.  Marland Kitchen ibuprofen (ADVIL) 600 MG tablet TAKE (1) TABLET BY MOUTH EVERY EIGHT HOURS AS NEEDED.  Marland Kitchen MAGNESIUM PO Take by mouth.  . Omega-3 Fatty Acids (OMEGA 3 PO) Take by mouth.  Marland Kitchen PARAGARD INTRAUTERINE COPPER IU by Intrauterine route.  . Prenatal Vit-Fe Fumarate-FA (PRENATAL PO) Take by mouth.  . Probiotic Product (PROBIOTIC PO) Take by mouth.  . sulfacetamide (BLEPH-10) 10 % ophthalmic solution Place 2 drops into the right eye in the morning, at noon, in the evening, and at bedtime.   No facility-administered encounter medications on file as of 02/11/2020.    Allergies: Allergies  Allergen Reactions  . Cefzil [Cefprozil] Itching and Nausea And Vomiting    Family History: Family History  Adopted: Yes  Problem Relation Age of Onset  . ADD / ADHD Daughter     Social History: Social History   Socioeconomic History  . Marital status: Married    Spouse name: Not on file  . Number of children: 4  . Years of education: 39  . Highest education level: Not on file  Occupational History  . Occupation: stay at home mom  Tobacco Use    . Smoking status: Never Smoker  . Smokeless tobacco: Never Used  Substance and Sexual Activity  . Alcohol use: No  . Drug use: No  . Sexual activity: Yes    Birth control/protection: I.U.D.  Other Topics Concern  . Not on file  Social History Narrative   Right handed   Drinks occ coffee   Two story home   Social Determinants of Health   Financial Resource Strain:   . Difficulty of Paying Living Expenses:   Food Insecurity:   . Worried About Charity fundraiser in the Last Year:   . Arboriculturist in the Last Year:   Transportation Needs:   . Film/video editor (Medical):   Marland Kitchen Lack of Transportation (Non-Medical):   Physical Activity:   . Days of Exercise per Week:   . Minutes of Exercise per Session:   Stress:   . Feeling of Stress :   Social Connections:   . Frequency of Communication with Friends and Family:   . Frequency of Social Gatherings with Friends and Family:   . Attends Religious Services:   . Active Member of Clubs or Organizations:   . Attends Archivist Meetings:   Marland Kitchen Marital Status:   Intimate Partner Violence:   . Fear of Current  or Ex-Partner:   . Emotionally Abused:   Marland Kitchen Physically Abused:   . Sexually Abused:     Observations/Objective:   Height 5\' 5"  (1.651 m), weight 239 lb (108.4 kg), currently breastfeeding. No acute distress.  Alert and oriented.  Speech fluent and not dysarthric.  Language intact.  Eyes orthophoric on primary gaze.  Face symmetric.  Assessment and Plan:   1.  Migraine with aura, without status migrainosus, not intractable 2.  Migraine without aura, without status migrainosus, not intractable 3.  Ocular migraine 4.  White matter changes on brain MRI nonspecific and may be seen in people with migraines.  1.  For preventative management, nortriptyline 10mg  at bedtime.  We can increase to 25mg  at bedtime in 4 weeks if needed. 2.  For abortive therapy, sumatriptan 100mg .  She states she is going to stop  breastfeeding completely.  I recommended to hold breastfeeding/pumping for 8 to 12 hours after use. 3.  Limit use of pain relievers to no more than 2 days out of week to prevent risk of rebound or medication-overuse headache. 4.  Keep headache diary 5.  Exercise, hydration, caffeine cessation, sleep hygiene, monitor for and avoid triggers 6.   Follow up 4 months.   Follow Up Instructions:    -I discussed the assessment and treatment plan with the patient. The patient was provided an opportunity to ask questions and all were answered. The patient agreed with the plan and demonstrated an understanding of the instructions.   The patient was advised to call back or seek an in-person evaluation if the symptoms worsen or if the condition fails to improve as anticipated.   Dudley Major, DO

## 2020-02-11 ENCOUNTER — Telehealth (INDEPENDENT_AMBULATORY_CARE_PROVIDER_SITE_OTHER): Payer: 59 | Admitting: Neurology

## 2020-02-11 ENCOUNTER — Other Ambulatory Visit: Payer: Self-pay

## 2020-02-11 VITALS — Ht 65.0 in | Wt 239.0 lb

## 2020-02-11 DIAGNOSIS — G43109 Migraine with aura, not intractable, without status migrainosus: Secondary | ICD-10-CM

## 2020-02-11 MED ORDER — SUMATRIPTAN SUCCINATE 100 MG PO TABS: ORAL_TABLET | ORAL | 3 refills | Status: DC

## 2020-02-11 MED ORDER — NORTRIPTYLINE HCL 10 MG PO CAPS: 10.0000 mg | ORAL_CAPSULE | Freq: Every day | ORAL | 3 refills | Status: DC

## 2020-02-11 NOTE — Patient Instructions (Addendum)
Migraine Recommendations: 1.  Start nortriptyline 10mg  at bedtime.  Call in 4 weeks with update and we can adjust dose if needed. 2.  Take sumatriptan 100mg  at earliest onset of headache.  May repeat dose once in 2 hours if needed.  Do not exceed two tablets in 24 hours.  Recommend holding breastfeeding/pumping for 8 to 12 hours after use. 3.  Limit use of pain relievers to no more than 2 days out of the week.  These medications include acetaminophen, ibuprofen, triptans and narcotics.  This will help reduce risk of rebound headaches. 4.  Be aware of common food triggers such as processed sweets, processed foods with nitrites (such as deli meat, hot dogs, sausages), foods with MSG, alcohol (such as wine), chocolate, certain cheeses, certain fruits (dried fruits, bananas, pineapple), vinegar, diet soda. 4.  Avoid caffeine 5.  Routine exercise 6.  Proper sleep hygiene 7.  Stay adequately hydrated with water 8.  Keep a headache diary. 9.  Maintain proper stress management. 10.  Do not skip meals. 11.  Consider supplements:  Magnesium citrate 400mg  to 600mg  daily, riboflavin 400mg , Coenzyme Q 10 100mg  three times daily   Migraine Headache A migraine headache is a very strong throbbing pain on one side or both sides of your head. This type of headache can also cause other symptoms. It can last from 4 hours to 3 days. Talk with your doctor about what things may bring on (trigger) this condition. What are the causes? The exact cause of this condition is not known. This condition may be triggered or caused by:  Drinking alcohol.  Smoking.  Taking medicines, such as: ? Medicine used to treat chest pain (nitroglycerin). ? Birth control pills. ? Estrogen. ? Some blood pressure medicines.  Eating or drinking certain products.  Doing physical activity. Other things that may trigger a migraine headache include:  Having a menstrual period.  Pregnancy.  Hunger.  Stress.  Not getting enough  sleep or getting too much sleep.  Weather changes.  Tiredness (fatigue). What increases the risk?  Being 5-24 years old.  Being female.  Having a family history of migraine headaches.  Being Caucasian.  Having depression or anxiety.  Being very overweight. What are the signs or symptoms?  A throbbing pain. This pain may: ? Happen in any area of the head, such as on one side or both sides. ? Make it hard to do daily activities. ? Get worse with physical activity. ? Get worse around bright lights or loud noises.  Other symptoms may include: ? Feeling sick to your stomach (nauseous). ? Vomiting. ? Dizziness. ? Being sensitive to bright lights, loud noises, or smells.  Before you get a migraine headache, you may get warning signs (an aura). An aura may include: ? Seeing flashing lights or having blind spots. ? Seeing bright spots, halos, or zigzag lines. ? Having tunnel vision or blurred vision. ? Having numbness or a tingling feeling. ? Having trouble talking. ? Having weak muscles.  Some people have symptoms after a migraine headache (postdromal phase), such as: ? Tiredness. ? Trouble thinking (concentrating). How is this treated?  Taking medicines that: ? Relieve pain. ? Relieve the feeling of being sick to your stomach. ? Prevent migraine headaches.  Treatment may also include: ? Having acupuncture. ? Avoiding foods that bring on migraine headaches. ? Learning ways to control your body functions (biofeedback). ? Therapy to help you know and deal with negative thoughts (cognitive behavioral therapy). Follow these instructions  at home: Medicines  Take over-the-counter and prescription medicines only as told by your doctor.  Ask your doctor if the medicine prescribed to you: ? Requires you to avoid driving or using heavy machinery. ? Can cause trouble pooping (constipation). You may need to take these steps to prevent or treat trouble pooping:  Drink  enough fluid to keep your pee (urine) pale yellow.  Take over-the-counter or prescription medicines.  Eat foods that are high in fiber. These include beans, whole grains, and fresh fruits and vegetables.  Limit foods that are high in fat and sugar. These include fried or sweet foods. Lifestyle  Do not drink alcohol.  Do not use any products that contain nicotine or tobacco, such as cigarettes, e-cigarettes, and chewing tobacco. If you need help quitting, ask your doctor.  Get at least 8 hours of sleep every night.  Limit and deal with stress. General instructions      Keep a journal to find out what may bring on your migraine headaches. For example, write down: ? What you eat and drink. ? How much sleep you get. ? Any change in what you eat or drink. ? Any change in your medicines.  If you have a migraine headache: ? Avoid things that make your symptoms worse, such as bright lights. ? It may help to lie down in a dark, quiet room. ? Do not drive or use heavy machinery. ? Ask your doctor what activities are safe for you.  Keep all follow-up visits as told by your doctor. This is important. Contact a doctor if:  You get a migraine headache that is different or worse than others you have had.  You have more than 15 headache days in one month. Get help right away if:  Your migraine headache gets very bad.  Your migraine headache lasts longer than 72 hours.  You have a fever.  You have a stiff neck.  You have trouble seeing.  Your muscles feel weak or like you cannot control them.  You start to lose your balance a lot.  You start to have trouble walking.  You pass out (faint).  You have a seizure. Summary  A migraine headache is a very strong throbbing pain on one side or both sides of your head. These headaches can also cause other symptoms.  This condition may be treated with medicines and changes to your lifestyle.  Keep a journal to find out what may  bring on your migraine headaches.  Contact a doctor if you get a migraine headache that is different or worse than others you have had.  Contact your doctor if you have more than 15 headache days in a month. This information is not intended to replace advice given to you by your health care provider. Make sure you discuss any questions you have with your health care provider. Document Revised: 03/12/2019 Document Reviewed: 12/31/2018 Elsevier Patient Education  [place].

## 2020-02-21 ENCOUNTER — Telehealth: Payer: Self-pay

## 2020-02-21 NOTE — Telephone Encounter (Signed)
Patient extremely sensitive to noise since she began the anti depressant medication. Has now begun to have headaches each day. Please call patient to advise

## 2020-02-22 NOTE — Telephone Encounter (Signed)
Spoke to pt asking if she can try high quality supplement multi vit and use the rescue meds if she gets a headache and see if she can find her triggers. And then If she starts having daily headaches then start the zonisamide

## 2020-02-22 NOTE — Telephone Encounter (Signed)
She may stop nortriptyline.   2.  If she is willing to discontinue breastfeeding, then instead I would like to start zonisamide 25mg .  Take 1 capsule daily for one week, then 2 capsules daily for one week, then 3 capsules daily for one week, then 4 capsules daily (quantity 120, refills 0).  Should contact us for refill.

## 2020-02-22 NOTE — Telephone Encounter (Signed)
Patient called to check on the status of the inquiry.

## 2020-02-23 NOTE — Telephone Encounter (Signed)
That's fine.  I typically recommend: Magnesium citrate 400mg  daily Riboflavin (vitamin B2) 400mg  daily (usually needs to get on [place] or speciality vitamin shop) Coenzyme Q 10 100mg  three times daily

## 2020-02-23 NOTE — Telephone Encounter (Signed)
Dr Tomi Likens typically recommend: Magnesium citrate 400mg  daily Riboflavin (vitamin B2) 400mg  daily (usually needs to get on [place] or speciality vitamin shop) Coenzyme Q 10 100mg  three times daily  Pt stated that she would start the vitamins. And would call the office if needed,

## 2020-04-11 ENCOUNTER — Encounter: Payer: Self-pay | Admitting: Women's Health

## 2020-04-11 ENCOUNTER — Ambulatory Visit (INDEPENDENT_AMBULATORY_CARE_PROVIDER_SITE_OTHER): Payer: 59 | Admitting: Women's Health

## 2020-04-11 VITALS — BP 127/73 | HR 97 | Ht 65.0 in | Wt 242.0 lb

## 2020-04-11 DIAGNOSIS — Z30432 Encounter for removal of intrauterine contraceptive device: Secondary | ICD-10-CM

## 2020-04-11 NOTE — Progress Notes (Signed)
   IUD REMOVAL  Patient name: Donna Kim MRN [id number]  Date of birth: Nov 13, 1981 Subjective Findings:   Donna Kim is a 39 y.o. G5P4004 Caucasian female being seen today for removal of a Paragard  IUD. Her IUD was placed 10/26/19.  She desires removal because migraines- neurologist said could be r/t IUD, weight gain immediately after insertion, changed eating habits, has lost some- but difficult, irregular periods. Signed copy of informed consent in chart.  Depression screen [facility] 2/9 03/17/2019 04/14/2017  Decreased Interest 0 0  Down, Depressed, Hopeless 0 0  PHQ - 2 Score 0 0  Altered sleeping 0 1  Tired, decreased energy 1 2  Change in appetite 0 1  Feeling bad or failure about yourself  0 1  Trouble concentrating 0 0  Moving slowly or fidgety/restless 0 0  Suicidal thoughts 0 0  PHQ-9 Score 1 5  Some encounter information is confidential and restricted. Go to Review Flowsheets activity to see all data.  Some recent data might be hidden    No LMP recorded. (Menstrual status: IUD). Last pap 04/14/17. Results were:  normal The planned method of family planning is rhythm method Pertinent History Reviewed:   Reviewed past medical,surgical, social, obstetrical and family history.  Reviewed problem list, medications and allergies. Objective Findings & Procedure:    Vitals:   04/11/20 1422 04/11/20 1442  BP: (!) 144/94 127/73  Pulse: 97 97  Weight: 242 lb (109.8 kg)   Height: 5\' 5"  (1.651 m)   Body mass index is 40.27 kg/m.  No results found for this or any previous visit (from the past 24 hour(s)).   Time out was performed.  A graves speculum was placed in the vagina.  The cervix was visualized, and the strings were visible. They were grasped and the Paragard  IUD was easily removed intact without complications. The patient tolerated the procedure well.   Chaperone: [place]:   1) Paragard  IUD removal Follow-up prn problems  No orders of  the defined types were placed in this encounter.   Follow-up: Return in about 4 weeks (around 05/09/2020) for Pap & physical.  Roma Schanz CNM, [facility] 04/11/2020 2:53 PM

## 2020-05-16 ENCOUNTER — Encounter: Payer: Self-pay | Admitting: Women's Health

## 2020-05-16 ENCOUNTER — Other Ambulatory Visit (HOSPITAL_COMMUNITY)
Admission: RE | Admit: 2020-05-16 | Discharge: 2020-05-16 | Disposition: A | Discharge status: Home / Self Care | Payer: Medicaid Other | Source: Ambulatory Visit | Attending: Obstetrics and Gynecology | Admitting: Obstetrics and Gynecology

## 2020-05-16 ENCOUNTER — Ambulatory Visit (INDEPENDENT_AMBULATORY_CARE_PROVIDER_SITE_OTHER): Payer: 59 | Admitting: Women's Health

## 2020-05-16 VITALS — BP 131/74 | HR 96 | Ht 65.0 in | Wt 245.5 lb

## 2020-05-16 DIAGNOSIS — Z0000 Encounter for general adult medical examination without abnormal findings: Secondary | ICD-10-CM | POA: Insufficient documentation

## 2020-05-16 DIAGNOSIS — Z1321 Encounter for screening for nutritional disorder: Secondary | ICD-10-CM

## 2020-05-16 DIAGNOSIS — R634 Abnormal weight loss: Secondary | ICD-10-CM | POA: Diagnosis not present

## 2020-05-16 DIAGNOSIS — Z1329 Encounter for screening for other suspected endocrine disorder: Secondary | ICD-10-CM

## 2020-05-16 DIAGNOSIS — Z6841 Body Mass Index (BMI) 40.0 and over, adult: Secondary | ICD-10-CM

## 2020-05-16 DIAGNOSIS — Z131 Encounter for screening for diabetes mellitus: Secondary | ICD-10-CM

## 2020-05-16 DIAGNOSIS — Z13220 Encounter for screening for lipoid disorders: Secondary | ICD-10-CM

## 2020-05-16 NOTE — Progress Notes (Signed)
WELL-WOMAN EXAMINATION Patient name: Donna Kim MRN [id number]  Date of birth: [birth year] Chief Complaint:   Gynecologic Exam  History of Present Illness:   Donna Kim is a 39 y.o. G11P4004 Caucasian female being seen today for a routine well-woman exam.  Current complaints: hard time losing weight, has started to make small changes (increasing water, cutting back sodas, eating butter lettuce instead of bun w/ hot dog)  Depression screen [facility] 2/9 03/17/2019 04/14/2017  Decreased Interest 0 0  Down, Depressed, Hopeless 0 0  PHQ - 2 Score 0 0  Altered sleeping 0 1  Tired, decreased energy 1 2  Change in appetite 0 1  Feeling bad or failure about yourself  0 1  Trouble concentrating 0 0  Moving slowly or fidgety/restless 0 0  Suicidal thoughts 0 0  PHQ-9 Score 1 5  Some encounter information is confidential and restricted. Go to Review Flowsheets activity to see all data.  Some recent data might be hidden     PCP: [place]      does desire labs including lipids/a1c/vit d, was adopted and doesn't know family hx Patient's last menstrual period was 04/28/2020. The current method of family planning is rhythm method.  Last pap 04/1417. Results were: normal. H/O abnormal pap: yes Last mammogram: never. Results were: N/A. Family h/o breast cancer: no Last colonoscopy: never. Results were: N/A. Family h/o colorectal cancer: no Review of Systems:   Pertinent items are noted in HPI Denies any headaches, blurred vision, fatigue, shortness of breath, chest pain, abdominal pain, abnormal vaginal discharge/itching/odor/irritation, problems with periods, bowel movements, urination, or intercourse unless otherwise stated above. Pertinent History Reviewed:  Reviewed past medical,surgical, social and family history.  Reviewed problem list, medications and allergies. Physical Assessment:   Vitals:   05/16/20 1349  BP: 131/74  Pulse: 96  Weight: 245 lb 8 oz (111.4 kg)  Height: 5\' 5"  (1.651  m)  Body mass index is 40.85 kg/m.        Physical Examination:   General appearance - well appearing, and in no distress  Mental status - alert, oriented to person, place, and time  Psych:  She has a normal mood and affect  Skin - warm and dry, normal color, no suspicious lesions noted, lots of moles  Chest - effort normal, all lung fields clear to auscultation bilaterally  Heart - normal rate and regular rhythm  Neck:  midline trachea, no thyromegaly or nodules  Breasts - breasts appear normal, no suspicious masses, no skin or nipple changes or  axillary nodes  Abdomen - soft, nontender, nondistended, no masses or organomegaly, small reducible umbilical hernia  Pelvic - VULVA: normal appearing vulva with no masses, tenderness or lesions  VAGINA: normal appearing vagina with normal color and discharge, no lesions  CERVIX: normal appearing cervix without discharge or lesions, no CMT  Thin prep pap is done w/ HR HPV cotesting  UTERUS: uterus is felt to be normal size, shape, consistency and nontender   ADNEXA: No adnexal masses or tenderness noted.  Extremities:  No swelling or varicosities noted  Chaperone: Amanda Rash    No results found for this or any previous visit (from the past 24 hour(s)).  Assessment & Plan:  1) Well-Woman Exam  2) BMI 40 w/ hard time losing weight> will check TSH today, start making more changes w/ diet/exercise  3) Multiple nevi> make appt w/ dermatologist  Labs/procedures today: pap, labs as below  Mammogram @39yo  or sooner if problems Colonoscopy @  or sooner if problems  Orders Placed This Encounter  Procedures  . CBC  . Comprehensive metabolic panel  . TSH  . Lipid panel  . Hemoglobin A1c  . VITAMIN D 25 Hydroxy (Vit-D Deficiency, Fractures)    Meds: No orders of the defined types were placed in this encounter.   Follow-up: Return for tomorrow am for fasting labs; then 19yr for physical.  Roma Schanz CNM, [facility] 05/16/2020 2:26 PM

## 2020-05-18 LAB — CYTOLOGY - PAP
Comment: NEGATIVE
Diagnosis: NEGATIVE
High risk HPV: NEGATIVE

## 2020-05-20 ENCOUNTER — Emergency Department (HOSPITAL_COMMUNITY)
Admission: EM | Admit: 2020-05-20 | Discharge: 2020-05-20 | Disposition: A | Discharge status: Home / Self Care | Payer: Medicaid Other | Attending: Emergency Medicine | Admitting: Emergency Medicine

## 2020-05-20 ENCOUNTER — Other Ambulatory Visit: Payer: Self-pay

## 2020-05-20 ENCOUNTER — Encounter (HOSPITAL_COMMUNITY): Payer: Self-pay | Admitting: Emergency Medicine

## 2020-05-20 ENCOUNTER — Emergency Department (HOSPITAL_COMMUNITY): Payer: Medicaid Other

## 2020-05-20 DIAGNOSIS — M549 Dorsalgia, unspecified: Secondary | ICD-10-CM | POA: Insufficient documentation

## 2020-05-20 DIAGNOSIS — R1013 Epigastric pain: Secondary | ICD-10-CM | POA: Insufficient documentation

## 2020-05-20 DIAGNOSIS — R1011 Right upper quadrant pain: Secondary | ICD-10-CM | POA: Insufficient documentation

## 2020-05-20 DIAGNOSIS — R110 Nausea: Secondary | ICD-10-CM | POA: Diagnosis not present

## 2020-05-20 LAB — URINALYSIS, ROUTINE W REFLEX MICROSCOPIC
Bilirubin Urine: NEGATIVE
Glucose, UA: NEGATIVE mg/dL
Hgb urine dipstick: NEGATIVE
Ketones, ur: NEGATIVE mg/dL
Nitrite: NEGATIVE
Protein, ur: NEGATIVE mg/dL
Specific Gravity, Urine: 1.021 (ref 1.005–1.030)
pH: 5 (ref 5.0–8.0)

## 2020-05-20 LAB — CBC
HCT: 41.1 % (ref 36.0–46.0)
Hemoglobin: 13.5 g/dL (ref 12.0–15.0)
MCH: 30 pg (ref 26.0–34.0)
MCHC: 32.8 g/dL (ref 30.0–36.0)
MCV: 91.3 fL (ref 80.0–100.0)
Platelets: 188 10*3/uL (ref 150–400)
RBC: 4.5 MIL/uL (ref 3.87–5.11)
RDW: 13.2 % (ref 11.5–15.5)
WBC: 12.8 10*3/uL — ABNORMAL HIGH (ref 4.0–10.5)
nRBC: 0 % (ref 0.0–0.2)

## 2020-05-20 LAB — COMPREHENSIVE METABOLIC PANEL
ALT: 23 U/L (ref 0–44)
AST: 18 U/L (ref 15–41)
Albumin: 4.1 g/dL (ref 3.5–5.0)
Alkaline Phosphatase: 81 U/L (ref 38–126)
Anion gap: 9 (ref 5–15)
BUN: 13 mg/dL (ref 6–20)
CO2: 21 mmol/L — ABNORMAL LOW (ref 22–32)
Calcium: 9.3 mg/dL (ref 8.9–10.3)
Chloride: 106 mmol/L (ref 98–111)
Creatinine, Ser: 0.57 mg/dL (ref 0.44–1.00)
GFR calc Af Amer: >60 mL/min (ref >60)
GFR calc non Af Amer: >60 mL/min (ref >60)
Glucose, Bld: 117 mg/dL — ABNORMAL HIGH (ref 70–99)
Potassium: 4 mmol/L (ref 3.5–5.1)
Sodium: 136 mmol/L (ref 135–145)
Total Bilirubin: 0.5 mg/dL (ref 0.3–1.2)
Total Protein: 8 g/dL (ref 6.5–8.1)

## 2020-05-20 LAB — LIPASE, BLOOD: Lipase: 28 U/L (ref 11–51)

## 2020-05-20 LAB — PREGNANCY, URINE: Preg Test, Ur: NEGATIVE

## 2020-05-20 MED ORDER — CIPROFLOXACIN HCL 250 MG PO TABS
500.0000 mg | ORAL_TABLET | Freq: Once | ORAL | Status: AC
Start: 2020-05-20 — End: 2020-05-20
  Administered 2020-05-20: 500 mg via ORAL
  Filled 2020-05-20: qty 2

## 2020-05-20 MED ORDER — HYDROCODONE-ACETAMINOPHEN 5-325 MG PO TABS: 2.0000 | ORAL_TABLET | ORAL | 0 refills | Status: DC | PRN

## 2020-05-20 MED ORDER — PANTOPRAZOLE SODIUM 40 MG IV SOLR
40.0000 mg | Freq: Once | INTRAVENOUS | Status: AC
  Administered 2020-05-20: 40 mg via INTRAVENOUS
  Filled 2020-05-20: qty 40

## 2020-05-20 MED ORDER — IOHEXOL 300 MG/ML  SOLN
100.0000 mL | Freq: Once | INTRAMUSCULAR | Status: AC | PRN
  Administered 2020-05-20: 100 mL via INTRAVENOUS

## 2020-05-20 MED ORDER — CIPROFLOXACIN HCL 500 MG PO TABS
500.0000 mg | ORAL_TABLET | Freq: Two times a day (BID) | ORAL | 0 refills | Status: DC
Start: 2020-05-20 — End: 2020-09-05

## 2020-05-20 MED ORDER — HYDROCODONE-ACETAMINOPHEN 5-325 MG PO TABS: ORAL_TABLET | ORAL | 0 refills | Status: DC

## 2020-05-20 MED ORDER — ACETAMINOPHEN 325 MG PO TABS
650.0000 mg | ORAL_TABLET | Freq: Once | ORAL | Status: AC
Start: 2020-05-20 — End: 2020-05-20
  Administered 2020-05-20: 650 mg via ORAL
  Filled 2020-05-20: qty 2

## 2020-05-20 NOTE — ED Provider Notes (Signed)
[facility] Provider Note   CSN: [id number] Arrival date & time: 05/20/20  1138     History Chief Complaint  Patient presents with  . Abdominal Pain    Donna Kim is a 39 y.o. female.  HPI      Donna Kim is a 39 y.o. female who presents to the Emergency Department complaining of right upper abdominal and epigastric pain.  Symptoms waxing and waning for 3 weeks, worse recently.  Reports waking with pain on Saturday mornings for 2-3 weeks after eating pizza and drinking alcohol on Friday evenings.  Abdominal pain  described as dull and associated with right upper back pain as well.  Occasional nausea w/o vomiting.  No fever, chest pain or shortness of breath.  Has known umbilical hernia and concerned if current symptoms related to hiatal hernia.    Past Medical History:  Diagnosis Date  . ADHD   . Back pain 11/12/2013  . Cholestasis 08/2017  . Contraceptive management 08/12/2014  . Headache   . Mental disorder    anxiety  . No pertinent past medical history   . Postpartum bleeding 07/15/2014  . Rosacea   . UTI (urinary tract infection)   . Vaginal Pap smear, abnormal     Patient Active Problem List   Diagnosis Date Noted  . Encounter for IUD insertion 10/26/2019  . Migraine with aura 08/19/2019  . Gestational thrombocytopenia ([place]) 06/22/2019  . History of postpartum hypertension 10/30/2017  . History of cholestasis during pregnancy 08/28/2017  . Depression with anxiety 04/14/2017  . ADHD (attention deficit hyperactivity disorder), inattentive type 10/02/2014  . Rosacea, acne 03/18/2014    Past Surgical History:  Procedure Laterality Date  . WISDOM TOOTH EXTRACTION       OB History    Gravida  4   Para  4   Term  4   Preterm      AB      Living  4     SAB      TAB      Ectopic      Multiple  0   Live Births  4           Family History  Adopted: Yes  Problem Relation Age of Onset  . ADD / ADHD Daughter       Social History   Tobacco Use  . Smoking status: Never Smoker  . Smokeless tobacco: Never Used  Vaping Use  . Vaping Use: Never used  Substance Use Topics  . Alcohol use: No  . Drug use: No    Home Medications Prior to Admission medications   Medication Sig Start Date End Date Taking? Authorizing Provider  ibuprofen (ADVIL) 800 MG tablet Take 800 mg by mouth daily as needed for mild pain or moderate pain.    Yes [provider]  Multiple Vitamin (MULTIVITAMIN) tablet Take 2 tablets by mouth in the morning and at bedtime.    Yes [provider]  Omega-3 Fatty Acids (OMEGA 3 PO) Take 2 capsules by mouth in the morning and at bedtime.    Yes [provider]  SUMAtriptan (IMITREX) 100 MG tablet Take 1 tablet earliest onset of migraine.  May repeat in 2 hours if headache persists or recurs.  Maximum 2 tablets in 24 hours. 02/11/20  Yes Jaffe, Adam R, DO  ciprofloxacin (CIPRO) 500 MG tablet Take 1 tablet (500 mg total) by mouth 2 (two) times daily. 05/20/20   Kem Parkinson, PA-C  HYDROcodone-acetaminophen (NORCO/VICODIN) 5-325 MG tablet Take 2 tablets by mouth every 4 (four) hours as needed. 05/20/20   Elcie Pelster, PA-C  HYDROcodone-acetaminophen (NORCO/VICODIN) 5-325 MG tablet Take one tab po q 4 hrs prn pain 05/20/20   Ruffus Kamaka, PA-C    Allergies    Cefzil [cefprozil]  Review of Systems   Review of Systems  Constitutional: Negative for appetite change, chills and fever.  Respiratory: Negative for shortness of breath.   Cardiovascular: Negative for chest pain.  Gastrointestinal: Positive for abdominal pain and nausea. Negative for blood in stool, diarrhea and vomiting.  Genitourinary: Negative for decreased urine volume, difficulty urinating, dysuria and flank pain.  Musculoskeletal: Positive for back pain (right upper back pain).  Skin: Negative for color change and rash.  Neurological: Negative for dizziness, weakness and numbness.   Hematological: Negative for adenopathy.    Physical Exam Updated Vital Signs BP 139/89 (BP Location: Right Arm)   Pulse 93   Temp 98.4 F (36.9 C) (Oral)   Resp 16   Ht 5\' 5"  (1.651 m)   Wt 110.8 kg   LMP 04/28/2020   SpO2 99%   BMI 40.65 kg/m   Physical Exam Vitals and nursing note reviewed.  Constitutional:      General: She is not in acute distress.    Appearance: She is not toxic-appearing.  Abdominal:     Palpations: Abdomen is soft.     Tenderness: There is abdominal tenderness. There is no right CVA tenderness, left CVA tenderness or guarding.     Comments: ttp of the right upper and epigastric regions.  Abdomen is soft, no guarding or rebound tenderness.  No CVA tenderness  Neurological:     Mental Status: She is alert.     ED Results / Procedures / Treatments   Labs (all labs ordered are listed, but only abnormal results are displayed) Labs Reviewed  COMPREHENSIVE METABOLIC PANEL - Abnormal; Notable for the following components:      Result Value   CO2 21 (*)    Glucose, Bld 117 (*)    All other components within normal limits  CBC - Abnormal; Notable for the following components:   WBC 12.8 (*)    All other components within normal limits  URINALYSIS, ROUTINE W REFLEX MICROSCOPIC - Abnormal; Notable for the following components:   APPearance HAZY (*)    Leukocytes,Ua TRACE (*)    Bacteria, UA FEW (*)    All other components within normal limits  LIPASE, BLOOD  PREGNANCY, URINE    EKG None  Radiology CT ABDOMEN PELVIS W CONTRAST  Result Date: 05/20/2020 CLINICAL DATA:  39 year old female with abdominal pain. EXAM: CT ABDOMEN AND PELVIS WITH CONTRAST TECHNIQUE: Multidetector CT imaging of the abdomen and pelvis was performed using the standard protocol following bolus administration of intravenous contrast. CONTRAST:  143mL OMNIPAQUE IOHEXOL 300 MG/ML  SOLN COMPARISON:  None. FINDINGS: Lower chest: The visualized lung bases are clear. No  intra-abdominal free air or free fluid. Hepatobiliary: Fatty infiltration of the liver. There is slight irregularity of the liver contour concerning for early changes of cirrhosis. Clinical correlation is recommended. No intrahepatic biliary ductal dilatation. There is diffuse gallbladder wall edema which may be related to underlying liver disease or represent acute cholecystitis. Further evaluation with right upper quadrant ultrasound recommended. No calcified gallstone. Pancreas: Unremarkable. No pancreatic ductal dilatation or surrounding inflammatory changes. Spleen: Normal in size without focal abnormality. Adrenals/Urinary Tract: The adrenal glands are unremarkable. There is no hydronephrosis on  either side. The visualized ureters and urinary bladder appear unremarkable. Stomach/Bowel: There is no bowel obstruction or active inflammation. The appendix is normal. Vascular/Lymphatic: The abdominal aorta and IVC are unremarkable. No portal venous gas. There is no adenopathy. Reproductive: The uterus is anteverted and grossly unremarkable. No adnexal masses. Other: There is diastasis of anterior abdominal wall musculature in the midline with a small fat containing umbilical hernia. No fluid collection. Musculoskeletal: Bilateral sacroiliitis. No acute osseous pathology. IMPRESSION: 1. Diffuse gallbladder wall edema may be related to underlying liver disease or represent acute cholecystitis. Further evaluation with right upper quadrant ultrasound recommended. 2. Fatty infiltration of the liver with findings concerning for early changes of cirrhosis. Clinical correlation is recommended. 3. No bowel obstruction. Normal appendix. 4. Bilateral sacroiliitis. Electronically Signed   By: Anner Crete M.D.   On: 05/20/2020 16:43    Procedures Procedures (including critical care time)  Medications Ordered in ED Medications  iohexol (OMNIPAQUE) 300 MG/ML solution 100 mL (100 mLs Intravenous Contrast Given 05/20/20  1624)  acetaminophen (TYLENOL) tablet 650 mg (650 mg Oral Given 05/20/20 1737)  pantoprazole (PROTONIX) injection 40 mg (40 mg Intravenous Given 05/20/20 1739)  ciprofloxacin (CIPRO) tablet 500 mg (500 mg Oral Given 05/20/20 1834)    ED Course  I have reviewed the triage vital signs and the nursing notes.  Pertinent labs & imaging results that were available during my care of the patient were reviewed by me and considered in my medical decision making (see chart for details).    MDM Rules/Calculators/A&P                          Pt with RUQ and epigastric pain, waxing and waning for 3 weeks.  Exam concerning for gallbladder disease.  She is well appearing,afebrile, no vomiting.  Mild leukocytosis.  CT abd/pelvis shows edema of GB wall.  [place] unavailable at this time.  Pt non toxic appearing.  Prefers d/c home and agrees to return here in am for ultrasound.    appt scheduled for 9:00 am tomorrow morning.  NPO after midnight and I will start cipro, short course of pain medication dispensed.    Final Clinical Impression(s) / ED Diagnoses Final diagnoses:  Right upper quadrant abdominal pain    Rx / DC Orders ED Discharge Orders         Ordered    US Abdomen Limited RUQ/Gall Gladder     Discontinue     05/20/20 1759    HYDROcodone-acetaminophen (NORCO/VICODIN) 5-325 MG tablet  Every 4 hours PRN     Discontinue  Reprint     05/20/20 1818    ciprofloxacin (CIPRO) 500 MG tablet  2 times daily     Discontinue  Reprint     05/20/20 1820    HYDROcodone-acetaminophen (NORCO/VICODIN) 5-325 MG tablet     Discontinue  Reprint     05/20/20 1820           Kem Parkinson, PA-C 05/20/20 2353    Milton Ferguson, MD 05/22/20 1049

## 2020-05-20 NOTE — Discharge Instructions (Addendum)
You have been scheduled to return here tomorrow morning at 9:00 am for an ultrasound of your abdomen.  Nothing to eat or drink after midnight tonight. Please try to arrive a few minutes early to register.

## 2020-05-20 NOTE — ED Notes (Signed)
Pt. States they get [facility] occasionally. Pt states they have also been sweaty. Pt. States they are also experiencing heart burn.

## 2020-05-20 NOTE — ED Triage Notes (Signed)
[facility] was told she had an umbilical hernia   abd pain x 3 weeks   Dry mouth  Heart burn  abd pain w pain to bath   "not sure if I have a hernia somewhere else"

## 2020-05-21 ENCOUNTER — Ambulatory Visit (HOSPITAL_COMMUNITY)
Admission: RE | Admit: 2020-05-21 | Discharge: 2020-05-21 | Disposition: A | Discharge status: Home / Self Care | Payer: 59 | Source: Ambulatory Visit | Attending: Emergency Medicine | Admitting: Emergency Medicine

## 2020-05-21 DIAGNOSIS — R1011 Right upper quadrant pain: Secondary | ICD-10-CM | POA: Insufficient documentation

## 2020-05-21 DIAGNOSIS — K760 Fatty (change of) liver, not elsewhere classified: Secondary | ICD-10-CM | POA: Diagnosis not present

## 2020-05-21 DIAGNOSIS — R1013 Epigastric pain: Secondary | ICD-10-CM | POA: Insufficient documentation

## 2020-05-21 DIAGNOSIS — K8020 Calculus of gallbladder without cholecystitis without obstruction: Secondary | ICD-10-CM | POA: Diagnosis not present

## 2020-05-21 NOTE — ED Provider Notes (Signed)
   Patient returned this morning for scheduled gallbladder ultrasound.  Since last evening, she reports pain has been controlled without taking pain medication.  Remains afebrile, no reports of vomiting.  Ultrasound equivocal, shows 10 mm stone and small amount of pericholecystic fluid and gallbladder wall thickening.  Discussed findings with patient and also discussed with Dr. Arnoldo Morale with surgery.  Patient comfortable with close follow-up with Dr. Arnoldo Morale on Monday in office.   US Abdomen Limited RUQ/Gall Gladder  Result Date: 05/21/2020 CLINICAL DATA:  Abnormal CT scan yesterday. Worsening epigastric pain. EXAM: ULTRASOUND ABDOMEN LIMITED RIGHT UPPER QUADRANT COMPARISON:  CT scan May 20, 2020 FINDINGS: Gallbladder: Gallbladder wall thickening is identified measuring 4.4 mm. There may be a tiny amount of pericholecystic fluid as seen on image 22. A single 10 mm stone is seen in the gallbladder. No Murphy's sign identified. Common bile duct: Diameter: 4 mm Liver: Increased echogenicity. No focal mass. Portal vein is patent on color Doppler imaging with normal direction of blood flow towards the liver. Other: None. IMPRESSION: 1. Gallbladder wall thickening with a single visualized 10 mm stone as above. There may be a tiny amount of pericholecystic fluid. No Murphy's sign. The findings are indeterminate and acute cholecystitis is not excluded on today's study given history. If the clinical picture remains ambiguous, recommend a HIDA scan. 2. Hepatic steatosis. These results will be called to the ordering clinician or representative by the Radiologist Assistant, and communication documented in the PACS or [employer]. Electronically Signed   By: Dorise Bullion III M.D   On: 05/21/2020 10:22      Kem Parkinson, PA-C 05/21/20 1409    Wyvonnia Dusky, MD 05/21/20 1911

## 2020-05-23 ENCOUNTER — Ambulatory Visit (INDEPENDENT_AMBULATORY_CARE_PROVIDER_SITE_OTHER): Payer: 59 | Admitting: General Surgery

## 2020-05-23 ENCOUNTER — Encounter: Payer: Self-pay | Admitting: General Surgery

## 2020-05-23 ENCOUNTER — Other Ambulatory Visit: Payer: Self-pay

## 2020-05-23 VITALS — BP 130/88 | HR 82 | Temp 96.8°F | Resp 16 | Ht 65.0 in | Wt 239.0 lb

## 2020-05-23 DIAGNOSIS — K8020 Calculus of gallbladder without cholecystitis without obstruction: Secondary | ICD-10-CM

## 2020-05-23 NOTE — Progress Notes (Signed)
Donna Kim; [id number]; 1981-02-15   HPI Patient is a 39 year old white female who was referred to my care by the emergency room for evaluation and treatment of cholelithiasis. She was seen in the emergency room several days ago and found on CT scan of the abdomen and ultrasound to have a single gallstone with mildly thickened gallbladder wall. Patient states that over the past few weeks, she has had intermittent back pain and nausea after eating pizza. She usually eats pizzas on Friday night. She has a longstanding history of intermittent gastroesophageal reflux disease. Since she was seen in the emergency room, her symptoms have resolved and she currently has no specific right upper quadrant abdominal pain, fever, chills, or jaundice. This was her first episode of right upper quadrant abdominal pain. Past Medical History:  Diagnosis Date  . ADHD   . Back pain 11/12/2013  . Cholestasis 08/2017  . Contraceptive management 08/12/2014  . Headache   . Mental disorder    anxiety  . No pertinent past medical history   . Postpartum bleeding 07/15/2014  . Rosacea   . UTI (urinary tract infection)   . Vaginal Pap smear, abnormal     Past Surgical History:  Procedure Laterality Date  . WISDOM TOOTH EXTRACTION      Family History  Adopted: Yes  Problem Relation Age of Onset  . ADD / ADHD Daughter     Current Outpatient Medications on File Prior to Visit  Medication Sig Dispense Refill  . ciprofloxacin (CIPRO) 500 MG tablet Take 1 tablet (500 mg total) by mouth 2 (two) times daily. 14 tablet 0  . HYDROcodone-acetaminophen (NORCO/VICODIN) 5-325 MG tablet Take 2 tablets by mouth every 4 (four) hours as needed. 6 tablet 0  . ibuprofen (ADVIL) 800 MG tablet Take 800 mg by mouth daily as needed for mild pain or moderate pain.     . Multiple Vitamin (MULTIVITAMIN) tablet Take 2 tablets by mouth in the morning and at bedtime.     . Omega-3 Fatty Acids (OMEGA 3 PO) Take 2 capsules by mouth in the  morning and at bedtime.     . SUMAtriptan (IMITREX) 100 MG tablet Take 1 tablet earliest onset of migraine.  May repeat in 2 hours if headache persists or recurs.  Maximum 2 tablets in 24 hours. 10 tablet 3   No current facility-administered medications on file prior to visit.    Allergies  Allergen Reactions  . Cefzil [Cefprozil] Itching and Nausea And Vomiting    Social History   Substance and Sexual Activity  Alcohol Use No    Social History   Tobacco Use  Smoking Status Never Smoker  Smokeless Tobacco Never Used    Review of Systems  Constitutional: Positive for malaise/fatigue.  HENT: Positive for ear pain, sinus pain and sore throat.   Eyes: Positive for pain.  Respiratory: Positive for cough and shortness of breath.   Cardiovascular: Negative.   Gastrointestinal: Positive for abdominal pain, heartburn and nausea.  Genitourinary: Negative.   Musculoskeletal: Positive for back pain, joint pain and neck pain.  Skin: Negative.   Neurological: Positive for dizziness.  Endo/Heme/Allergies: Negative.   Psychiatric/Behavioral: Negative.     Objective   Vitals:   05/23/20 1055  BP: 130/88  Pulse: 82  Resp: 16  Temp: (!) 96.8 F (36 C)  SpO2: 99%    Physical Exam Vitals reviewed.  Constitutional:      Appearance: Normal appearance. She is obese. She is not ill-appearing.  HENT:  Head: Normocephalic and atraumatic.  Eyes:     General: No scleral icterus. Cardiovascular:     Rate and Rhythm: Normal rate and regular rhythm.     Heart sounds: Normal heart sounds. No murmur heard.  No friction rub. No gallop.   Pulmonary:     Effort: Pulmonary effort is normal. No respiratory distress.     Breath sounds: Normal breath sounds. No stridor. No wheezing, rhonchi or rales.  Abdominal:     General: Bowel sounds are normal. There is no distension.     Palpations: Abdomen is soft. There is no mass.     Tenderness: There is no abdominal tenderness. There is no  guarding or rebound.     Hernia: No hernia is present.  Skin:    General: Skin is warm and dry.  Neurological:     Mental Status: She is alert and oriented to person, place, and time.    ER notes reviewed. CT and ultrasound reports reviewed Assessment  Biliary colic secondary to cholelithiasis, resolved History of GERD Plan   As her biliary colic seems to have resolved, there is no need for laparoscopic cholecystectomy at this time. She is fine with that. Her symptomatology is complicated by her history of GERD. She would like to try abstinence of those trigger foods that may cause her to have biliary colic. Literature was given. She does realize that she may have an episode of biliary colic in the future. Should this occur, she was instructed to call me. Follow-up as needed.

## 2020-05-23 NOTE — Patient Instructions (Signed)
Biliary Colic, Adult  Biliary colic is severe pain caused by a problem with a small organ in the upper right part of your belly (gallbladder). The gallbladder stores a digestive fluid produced in the liver (bile) that helps the body break down fat. Bile and other digestive enzymes are carried from the liver to the small intestine through tube-like structures (bile ducts). The gallbladder and the bile ducts form the biliary tract. Sometimes hard deposits of digestive fluids form in the gallbladder (gallstones) and block the flow of bile from the gallbladder, causing biliary colic. This condition is also called a gallbladder attack. Gallstones can be as small as a grain of sand or as big as a golf ball. There could be just one gallstone in the gallbladder, or there could be many. What are the causes? Biliary colic is usually caused by gallstones. Less often, a tumor could block the flow of bile from the gallbladder and trigger biliary colic. What increases the risk? This condition is more likely to develop in:  Women.  People of Hispanic descent.  People with a family history of gallstones.  People who are obese.  People who suddenly or quickly lose weight.  People who eat a high-calorie, low-fiber diet that is rich in refined carbs (carbohydrates), such as white bread and white rice.  People who have an intestinal disease that affects nutrient absorption, such as Crohn disease.  People who have a metabolic condition, such as metabolic syndrome or diabetes. What are the signs or symptoms? Severe pain in the upper right side of the belly is the main symptom of biliary colic. You may feel this pain below the chest but above the hip. This pain often occurs at night or after eating a very fatty meal. This pain may get worse for up to an hour and last as long as 12 hours. In most cases, the pain fades (subsides) within a couple hours. Other symptoms of this condition include:  Nausea and  vomiting.  Pain under the right shoulder. How is this diagnosed? This condition is diagnosed based on your medical history, your symptoms, and a physical exam. You may have tests, including:  Blood tests to rule out infection or inflammation of the bile ducts, gallbladder, pancreas, or liver.  Imaging studies such as: ? Ultrasound. ? CT scan. ? MRI. In some cases, you may need to have an imaging study done using a small amount of radioactive material (nuclear medicine) to confirm the diagnosis. How is this treated? Treatment for this condition may include medicine to relieve your pain or nausea. If you have gallstones that are causing biliary colic, you may need surgery to remove the gallbladder (cholecystectomy). Gallstones can also be dissolved gradually with medicine. It may take months or years before the gallstones are completely gone. Follow these instructions at home:  Take over-the-counter and prescription medicines only as told by your health care provider.  Drink enough fluid to keep your urine clear or pale yellow.  Follow instructions from your health care provider about eating or drinking restrictions. These may include avoiding: ? Fatty, greasy, and fried foods. ? Any foods that make the pain worse. ? Overeating. ? Having a large meal after not eating for a while.  Keep all follow-up visits as told by your health care provider. This is important. How is this prevented? Steps to prevent this condition include:  Maintaining a healthy body weight.  Getting regular exercise.  Eating a healthy, high-fiber, low-fat diet.  Limiting how much   sugar and refined carbs you eat, such as sweets, white flour, and white rice. Contact a health care provider if:  Your pain lasts more than 5 hours.  You vomit.  You have a fever and chills.  Your pain gets worse. Get help right away if:  Your skin or the whites of your eyes look yellow (jaundice).  Your have tea-colored  urine and light-colored stools.  You are dizzy or you faint. Summary  Biliary colic is severe pain caused by a problem with a small organ in the upper right part of your belly (gallbladder).  Treatments for this condition include medicines that relieves your pain or nausea and medicines that slowly dissolves the gallstones.  If gallstones cause your biliary colic, the treatment is surgery to remove the gallbladder (cholecystectomy). This information is not intended to replace advice given to you by your health care provider. Make sure you discuss any questions you have with your health care provider. Document Revised: 10/31/2017 Document Reviewed: 06/03/2016 Elsevier Patient Education  [place]. Cholelithiasis  Cholelithiasis is a form of gallbladder disease in which gallstones form in the gallbladder. The gallbladder is an organ that stores bile. Bile is made in the liver, and it helps to digest fats. Gallstones begin as small crystals and slowly grow into stones. They may cause no symptoms until the gallbladder tightens (contracts) and a gallstone is blocking the duct (gallbladder attack), which can cause pain. Cholelithiasis is also referred to as gallstones. There are two main types of gallstones:  Cholesterol stones. These are made of hardened cholesterol and are usually yellow-green in color. They are the most common type of gallstone. Cholesterol is a white, waxy, fat-like substance that is made in the liver.  Pigment stones. These are dark in color and are made of a red-yellow substance that forms when hemoglobin from red blood cells breaks down (bilirubin). What are the causes? This condition may be caused by an imbalance in the substances that bile is made of. This can happen if the bile:  Has too much bilirubin.  Has too much cholesterol.  Does not have enough bile salts. These salts help the body absorb and digest fats. In some cases, this condition can also be  caused by the gallbladder not emptying completely or often enough. What increases the risk? The following factors may make you more likely to develop this condition:  Being female.  Having multiple pregnancies. Health care providers sometimes advise removing diseased gallbladders before future pregnancies.  Eating a diet that is heavy in fried foods, fat, and refined carbohydrates, like white bread and white rice.  Being obese.  Being older than age 34.  Prolonged use of medicines that contain female hormones (estrogen).  Having diabetes mellitus.  Rapidly losing weight.  Having a family history of gallstones.  Being of [place] or [place] descent.  Having an intestinal disease such as Crohn disease.  Having metabolic syndrome.  Having cirrhosis.  Having severe types of anemia such as sickle cell anemia. What are the signs or symptoms? In most cases, there are no symptoms. These are known as silent gallstones. If a gallstone blocks the bile ducts, it can cause a gallbladder attack. The main symptom of a gallbladder attack is sudden pain in the upper right abdomen. The pain usually comes at night or after eating a large meal. The pain can last for one or several hours and can spread to the right shoulder or chest. If the bile duct is blocked  for more than a few hours, it can cause infection or inflammation of the gallbladder, liver, or pancreas, which may cause:  Nausea.  Vomiting.  Abdominal pain that lasts for 5 hours or more.  Fever or chills.  Yellowing of the skin or the whites of the eyes (jaundice).  Dark urine.  Light-colored stools. How is this diagnosed? This condition may be diagnosed based on:  A physical exam.  Your medical history.  An ultrasound of your gallbladder.  CT scan.  MRI.  Blood tests to check for signs of infection or inflammation.  A scan of your gallbladder and bile ducts (biliary system) using nonharmful radioactive  material and special cameras that can see the radioactive material (cholescintigram). This test checks to see how your gallbladder contracts and whether bile ducts are blocked.  Inserting a small tube with a camera on the end (endoscope) through your mouth to inspect bile ducts and check for blockages (endoscopic retrograde cholangiopancreatogram). How is this treated? Treatment for gallstones depends on the severity of the condition. Silent gallstones do not need treatment. If the gallstones cause a gallbladder attack or other symptoms, treatment may be required. Options for treatment include:  Surgery to remove the gallbladder (cholecystectomy). This is the most common treatment.  Medicines to dissolve gallstones. These are most effective at treating small gallstones. You may need to take medicines for up to 6-12 months.  Shock wave treatment (extracorporeal biliary lithotripsy). In this treatment, an ultrasound machine sends shock waves to the gallbladder to break gallstones into smaller pieces. These pieces can then be passed into the intestines or be dissolved by medicine. This is rarely used.  Removing gallstones through endoscopic retrograde cholangiopancreatogram. A small basket can be attached to the endoscope and used to capture and remove gallstones. Follow these instructions at home:  Take over-the-counter and prescription medicines only as told by your health care provider.  Maintain a healthy weight and follow a healthy diet. This includes: ? Reducing fatty foods, such as fried food. ? Reducing refined carbohydrates, like white bread and white rice. ? Increasing fiber. Aim for foods like almonds, fruit, and beans.  Keep all follow-up visits as told by your health care provider. This is important. Contact a health care provider if:  You think you have had a gallbladder attack.  You have been diagnosed with silent gallstones and you develop abdominal pain or indigestion. Get  help right away if:  You have pain from a gallbladder attack that lasts for more than 2 hours.  You have abdominal pain that lasts for more than 5 hours.  You have a fever or chills.  You have persistent nausea and vomiting.  You develop jaundice.  You have dark urine or light-colored stools. Summary  Cholelithiasis (also called gallstones) is a form of gallbladder disease in which gallstones form in the gallbladder.  This condition is caused by an imbalance in the substances that make up bile. This can happen if the bile has too much cholesterol, too much bilirubin, or not enough bile salts.  You are more likely to develop this condition if you are female, pregnant, using medicines with estrogen, obese, older than age 42, or have a family history of gallstones. You may also develop gallstones if you have diabetes, an intestinal disease, cirrhosis, or metabolic syndrome.  Treatment for gallstones depends on the severity of the condition. Silent gallstones do not need treatment.  If gallstones cause a gallbladder attack or other symptoms, treatment may be needed. The  most common treatment is surgery to remove the gallbladder. This information is not intended to replace advice given to you by your health care provider. Make sure you discuss any questions you have with your health care provider. Document Revised: 10/31/2017 Document Reviewed: 08/04/2016 Elsevier Patient Education  2020 [employer].

## 2020-05-24 LAB — COMPREHENSIVE METABOLIC PANEL
ALT: 25 IU/L (ref 0–32)
AST: 22 IU/L (ref 0–40)
Albumin/Globulin Ratio: 1.4 (ref 1.2–2.2)
Albumin: 4.5 g/dL (ref 3.8–4.8)
Alkaline Phosphatase: 91 IU/L (ref 48–121)
BUN/Creatinine Ratio: 17 (ref 9–23)
BUN: 14 mg/dL (ref 6–20)
Bilirubin Total: 0.6 mg/dL (ref 0.0–1.2)
CO2: 20 mmol/L (ref 20–29)
Calcium: 9.5 mg/dL (ref 8.7–10.2)
Chloride: 104 mmol/L (ref 96–106)
Creatinine, Ser: 0.81 mg/dL (ref 0.57–1.00)
GFR calc Af Amer: 108 mL/min/{1.73_m2} (ref >59)
GFR calc non Af Amer: 94 mL/min/{1.73_m2} (ref >59)
Globulin, Total: 3.3 g/dL (ref 1.5–4.5)
Glucose: 95 mg/dL (ref 65–99)
Potassium: 4.2 mmol/L (ref 3.5–5.2)
Sodium: 138 mmol/L (ref 134–144)
Total Protein: 7.8 g/dL (ref 6.0–8.5)

## 2020-05-24 LAB — CBC
Hematocrit: 41.6 % (ref 34.0–46.6)
Hemoglobin: 14.2 g/dL (ref 11.1–15.9)
MCH: 30.2 pg (ref 26.6–33.0)
MCHC: 34.1 g/dL (ref 31.5–35.7)
MCV: 89 fL (ref 79–97)
Platelets: 156 10*3/uL (ref 150–450)
RBC: 4.7 x10E6/uL (ref 3.77–5.28)
RDW: 12.9 % (ref 11.7–15.4)
WBC: 8.5 10*3/uL (ref 3.4–10.8)

## 2020-05-24 LAB — LIPID PANEL
Chol/HDL Ratio: 4.4 ratio (ref 0.0–4.4)
Cholesterol, Total: 206 mg/dL — ABNORMAL HIGH (ref 100–199)
HDL: 47 mg/dL (ref >39)
LDL Chol Calc (NIH): 135 mg/dL — ABNORMAL HIGH (ref 0–99)
Triglycerides: 131 mg/dL (ref 0–149)
VLDL Cholesterol Cal: 24 mg/dL (ref 5–40)

## 2020-05-24 LAB — HEMOGLOBIN A1C
Est. average glucose Bld gHb Est-mCnc: 100 mg/dL
Hgb A1c MFr Bld: 5.1 % (ref 4.8–5.6)

## 2020-05-24 LAB — VITAMIN D 25 HYDROXY (VIT D DEFICIENCY, FRACTURES): Vit D, 25-Hydroxy: 27.4 ng/mL — ABNORMAL LOW (ref 30.0–100.0)

## 2020-05-24 LAB — TSH: TSH: 1.41 u[IU]/mL (ref 0.450–4.500)

## 2020-06-16 NOTE — Progress Notes (Signed)
Virtual Visit via Video Note The purpose of this virtual visit is to provide medical care while limiting exposure to the novel coronavirus.    Consent was obtained for video visit:  Yes.   Answered questions that patient had about telehealth interaction:  Yes.   I discussed the limitations, risks, security and privacy concerns of performing an evaluation and management service by telemedicine. I also discussed with the patient that there may be a patient responsible charge related to this service. The patient expressed understanding and agreed to proceed.  Pt location: Home Physician Location: office Name of referring provider:  Mikey Kirschner, MD I connected with Augustina Mood at patients initiation/request on 06/19/2020 at  2:30 PM EDT by video enabled telemedicine application and verified that I am speaking with the correct person using two identifiers. Pt MRN:  [id number] Pt DOB:  [birth year] Video Participants:  Augustina Mood   History of Present Illness:  Donna Kim is a 39 year old female who follows up for migraines.  UPDATE: She experienced severe phonophobia when she started nortriptyline.  She decided to take supplements.  Migraines improved.   Intensity:  severe Duration:  About 2 hours with sumatriptan Frequency:  No migraines in past 30 days  Current NSAIDS:  ibuprofen Current analgesics:  acetaminophen Current triptans:  sumatriptan 100mg  Current ergotamine:  none Current anti-emetic:  none Current muscle relaxants:  none Current anti-anxiolytic:  none Current sleep aide:  none Current Antihypertensive medications:  none Current Antidepressant medications:  none Current Anticonvulsant medications:  zonisamide 100mg  daily Current anti-CGRP:  none Current Vitamins/Herbal/Supplements:  magnesium, coenzyme Q10, riboflavin Current Antihistamines/Decongestants:  none Other therapy:  none Hormone/birth control:  none Other medications:  none  Caffeine:   Rarely coffee.  Rarely soda Diet:  Trying to increase water intake.  Rarely soda.  Sometimes skips meals.  Snacks. Exercise:  no Depression:  no; Anxiety:  some Other pain:  no Sleep hygiene:  Not great.  Her baby still wakes up insomnia  HISTORY: She started having migraines with headache, photophobia and phonophobia in 2010 but responded to chiropractic therapy.  Once in a while, she would have an ocular migraine consisting of scintillating fortification described as jagged geometric shapes of pulsating color in the center of her vision that would slowly move out of of view, lasting 30 minutes.  Sometimes a headache may follow but not always.  In August, during her third trimester, she began having frequent episodes of ocular migraine.  She would also have other visual aura described as fireworks.  She developed trouble talking, felt off-balance and dizziness.  MRI and MRV of the head without contrast on 07/27/2019 was personally reviewed and showed nonspecific scattered punctate subcortical hyperintensities but no acute intracranial abnormality and MRV was negative for dural sinus thrombosis.  Symptoms calmed down and she gave birth to her son on October 18.  She had her copper IUD placed in late November.  In December, she began experiencing daily to near daily headaches.  They have since reduced in frequency, occurring 1 to 2 times a week and lasting 2 to 3 hours.  They are moderate to severe right sided headaches, either can be pounding, pressure, sharp/stabbing pain with right eye lacrimation, photophobia, phonophobia but not much nausea.  Question whether they are hormonal as they were aggravated following IUD.  Change in barometric pressure also a trigger.  Fioricet is helpful.    She was breastfeeding but has recently almost stopped.  She may still  breastfeed in the middle of the night.     Past NSAIDS:  none Past analgesics:  Fioricet Past abortive triptans:  none Past abortive  ergotamine:  none Past muscle relaxants:  none Past anti-emetic:  Zofran ODT 8mg ; Promethazine 25mg  Past antihypertensive medications:  amlodipine Past antidepressant medications:  Sertraline 50mg , nortriptyline 10mg  (noise sensitivity) Past anticonvulsant medications:  none Past anti-CGRP:  none Past vitamins/Herbal/Supplements:  riboflavin Past antihistamines/decongestants:  meclizine Other past therapies:  none   Family history of headache:  Unknown (adopted)   Past Medical History: Past Medical History:  Diagnosis Date  . ADHD   . Back pain 11/12/2013  . Cholestasis 08/2017  . Contraceptive management 08/12/2014  . Headache   . Mental disorder    anxiety  . No pertinent past medical history   . Postpartum bleeding 07/15/2014  . Rosacea   . UTI (urinary tract infection)   . Vaginal Pap smear, abnormal     Medications: Outpatient Encounter Medications as of 06/19/2020  Medication Sig  . Coenzyme Q10 (COQ10 PO) Take by mouth.  Marland Kitchen ibuprofen (ADVIL) 800 MG tablet Take 800 mg by mouth daily as needed for mild pain or moderate pain.   . Multiple Vitamin (MULTIVITAMIN) tablet Take 2 tablets by mouth in the morning and at bedtime.   . Omega-3 Fatty Acids (OMEGA 3 PO) Take 2 capsules by mouth in the morning and at bedtime.   . SUMAtriptan (IMITREX) 100 MG tablet Take 1 tablet earliest onset of migraine.  May repeat in 2 hours if headache persists or recurs.  Maximum 2 tablets in 24 hours.  . ciprofloxacin (CIPRO) 500 MG tablet Take 1 tablet (500 mg total) by mouth 2 (two) times daily.  Marland Kitchen HYDROcodone-acetaminophen (NORCO/VICODIN) 5-325 MG tablet Take 2 tablets by mouth every 4 (four) hours as needed.   No facility-administered encounter medications on file as of 06/19/2020.    Allergies: Allergies  Allergen Reactions  . Cefzil [Cefprozil] Itching and Nausea And Vomiting    Family History: Family History  Adopted: Yes  Problem Relation Age of Onset  . ADD / ADHD Daughter       Social History: Social History   Socioeconomic History  . Marital status: Married    Spouse name: Not on file  . Number of children: 4  . Years of education: 89  . Highest education level: Not on file  Occupational History  . Occupation: stay at home mom  Tobacco Use  . Smoking status: Never Smoker  . Smokeless tobacco: Never Used  Vaping Use  . Vaping Use: Never used  Substance and Sexual Activity  . Alcohol use: No  . Drug use: No  . Sexual activity: Yes    Birth control/protection: None, Condom  Other Topics Concern  . Not on file  Social History Narrative   Right handed   Drinks occ coffee   Two story home   Social Determinants of Health   Financial Resource Strain:   . Difficulty of Paying Living Expenses:   Food Insecurity:   . Worried About Charity fundraiser in the Last Year:   . Arboriculturist in the Last Year:   Transportation Needs:   . Film/video editor (Medical):   Marland Kitchen Lack of Transportation (Non-Medical):   Physical Activity:   . Days of Exercise per Week:   . Minutes of Exercise per Session:   Stress:   . Feeling of Stress :   Social Connections:   . Frequency  of Communication with Friends and Family:   . Frequency of Social Gatherings with Friends and Family:   . Attends Religious Services:   . Active Member of Clubs or Organizations:   . Attends Archivist Meetings:   Marland Kitchen Marital Status:   Intimate Partner Violence:   . Fear of Current or Ex-Partner:   . Emotionally Abused:   Marland Kitchen Physically Abused:   . Sexually Abused:     Observations/Objective:   Height 5\' 5"  (1.651 m), weight 234 lb (106.1 kg), not currently breastfeeding. No acute distress.  Alert and oriented.  Speech fluent and not dysarthric.  Language intact.  Eyes orthophoric on primary gaze.  Face symmetric.  Assessment and Plan:   Migraine with aura, without status migrainosus, not intractable, stable  1.  For preventative management, continue Mg, B2,  CoQ10 2.  For abortive therapy, sumatriptan 100mg  3.  Limit use of pain relievers to no more than 2 days out of week to prevent risk of rebound or medication-overuse headache. 4.  Keep headache diary 5.  Exercise, hydration, caffeine cessation, sleep hygiene, monitor for and avoid triggers 6.  Follow up 6 months   Follow Up Instructions:    -I discussed the assessment and treatment plan with the patient. The patient was provided an opportunity to ask questions and all were answered. The patient agreed with the plan and demonstrated an understanding of the instructions.   The patient was advised to call back or seek an in-person evaluation if the symptoms worsen or if the condition fails to improve as anticipated.   Dudley Major, DO

## 2020-06-19 ENCOUNTER — Telehealth (INDEPENDENT_AMBULATORY_CARE_PROVIDER_SITE_OTHER): Payer: 59 | Admitting: Neurology

## 2020-06-19 ENCOUNTER — Other Ambulatory Visit: Payer: Self-pay

## 2020-06-19 ENCOUNTER — Encounter: Payer: Self-pay | Admitting: Neurology

## 2020-06-19 VITALS — Ht 65.0 in | Wt 234.0 lb

## 2020-06-19 DIAGNOSIS — G43109 Migraine with aura, not intractable, without status migrainosus: Secondary | ICD-10-CM

## 2020-09-05 ENCOUNTER — Ambulatory Visit: Payer: Medicaid Other | Admitting: Family Medicine

## 2020-09-05 ENCOUNTER — Other Ambulatory Visit: Payer: Self-pay

## 2020-09-05 ENCOUNTER — Encounter: Payer: Self-pay | Admitting: Family Medicine

## 2020-09-05 VITALS — BP 110/70 | HR 98 | Temp 98.4°F | Ht 65.0 in | Wt 235.0 lb

## 2020-09-05 DIAGNOSIS — K219 Gastro-esophageal reflux disease without esophagitis: Secondary | ICD-10-CM | POA: Insufficient documentation

## 2020-09-05 DIAGNOSIS — K8020 Calculus of gallbladder without cholecystitis without obstruction: Secondary | ICD-10-CM | POA: Diagnosis not present

## 2020-09-05 NOTE — Patient Instructions (Signed)
Cholelithiasis  Cholelithiasis is also called "gallstones." It is a kind of gallbladder disease. The gallbladder is an organ that stores a liquid (bile) that helps you digest fat. Gallstones may not cause symptoms (may be silent gallstones) until they cause a blockage, and then they can cause pain (gallbladder attack). Follow these instructions at home:  Take over-the-counter and prescription medicines only as told by your doctor.  Stay at a healthy weight.  Eat healthy foods. This includes: ? Eating fewer fatty foods, like fried foods. ? Eating fewer refined carbs (refined carbohydrates). Refined carbs are breads and grains that are highly processed, like white bread and white rice. Instead, choose whole grains like whole-wheat bread and brown rice. ? Eating more fiber. Almonds, fresh fruit, and beans are healthy sources of fiber.  Keep all follow-up visits as told by your doctor. This is important. Contact a doctor if:  You have sudden pain in the upper right side of your belly (abdomen). Pain might spread to your right shoulder or your chest. This may be a sign of a gallbladder attack.  You feel sick to your stomach (are nauseous).  You throw up (vomit).  You have been diagnosed with gallstones that have no symptoms and you get: ? Belly pain. ? Discomfort, burning, or fullness in the upper part of your belly (indigestion). Get help right away if:  You have sudden pain in the upper right side of your belly, and it lasts for more than 2 hours.  You have belly pain that lasts for more than 5 hours.  You have a fever or chills.  You keep feeling sick to your stomach or you keep throwing up.  Your skin or the whites of your eyes turn yellow (jaundice).  You have dark-colored pee (urine).  You have light-colored poop (stool). Summary  Cholelithiasis is also called "gallstones."  The gallbladder is an organ that stores a liquid (bile) that helps you digest fat.  Silent  gallstones are gallstones that do not cause symptoms.  A gallbladder attack may cause sudden pain in the upper right side of your belly. Pain might spread to your right shoulder or your chest. If this happens, contact your doctor.  If you have sudden pain in the upper right side of your belly that lasts for more than 2 hours, get help right away. This information is not intended to replace advice given to you by your health care provider. Make sure you discuss any questions you have with your health care provider. Document Revised: 10/31/2017 Document Reviewed: 08/04/2016 Elsevier Patient Education  [place]. Gastroesophageal Reflux Disease, Adult Gastroesophageal reflux (GER) happens when acid from the stomach flows up into the tube that connects the mouth and the stomach (esophagus). Normally, food travels down the esophagus and stays in the stomach to be digested. With GER, food and stomach acid sometimes move back up into the esophagus. You may have a disease called gastroesophageal reflux disease (GERD) if the reflux:  Happens often.  Causes frequent or very bad symptoms.  Causes problems such as damage to the esophagus. When this happens, the esophagus becomes sore and swollen (inflamed). Over time, GERD can make small holes (ulcers) in the lining of the esophagus. What are the causes? This condition is caused by a problem with the muscle between the esophagus and the stomach. When this muscle is weak or not normal, it does not close properly to keep food and acid from coming back up from the stomach. The muscle  can be weak because of:  Tobacco use.  Pregnancy.  Having a certain type of hernia (hiatal hernia).  Alcohol use.  Certain foods and drinks, such as coffee, chocolate, onions, and peppermint. What increases the risk? You are more likely to develop this condition if you:  Are overweight.  Have a disease that affects your connective tissue.  Use NSAID  medicines. What are the signs or symptoms? Symptoms of this condition include:  Heartburn.  Difficult or painful swallowing.  The feeling of having a lump in the throat.  A bitter taste in the mouth.  Bad breath.  Having a lot of saliva.  Having an upset or bloated stomach.  Belching.  Chest pain. Different conditions can cause chest pain. Make sure you see your doctor if you have chest pain.  Shortness of breath or noisy breathing (wheezing).  Ongoing (chronic) cough or a cough at night.  Wearing away of the surface of teeth (tooth enamel).  Weight loss. How is this treated? Treatment will depend on how bad your symptoms are. Your doctor may suggest:  Changes to your diet.  Medicine.  Surgery. Follow these instructions at home: Eating and drinking   Follow a diet as told by your doctor. You may need to avoid foods and drinks such as: ? Coffee and tea (with or without caffeine). ? Drinks that contain alcohol. ? Energy drinks and sports drinks. ? Bubbly (carbonated) drinks or sodas. ? Chocolate and cocoa. ? Peppermint and mint flavorings. ? Garlic and onions. ? Horseradish. ? Spicy and acidic foods. These include peppers, chili powder, curry powder, vinegar, hot sauces, and BBQ sauce. ? Citrus fruit juices and citrus fruits, such as oranges, lemons, and limes. ? Tomato-based foods. These include red sauce, chili, salsa, and pizza with red sauce. ? Fried and fatty foods. These include donuts, french fries, potato chips, and high-fat dressings. ? High-fat meats. These include hot dogs, rib eye steak, sausage, ham, and bacon. ? High-fat dairy items, such as whole milk, butter, and cream cheese.  Eat small meals often. Avoid eating large meals.  Avoid drinking large amounts of liquid with your meals.  Avoid eating meals during the 2-3 hours before bedtime.  Avoid lying down right after you eat.  Do not exercise right after you eat. Lifestyle   Do not  use any products that contain nicotine or tobacco. These include cigarettes, e-cigarettes, and chewing tobacco. If you need help quitting, ask your doctor.  Try to lower your stress. If you need help doing this, ask your doctor.  If you are overweight, lose an amount of weight that is healthy for you. Ask your doctor about a safe weight loss goal. General instructions  Pay attention to any changes in your symptoms.  Take over-the-counter and prescription medicines only as told by your doctor. Do not take aspirin, ibuprofen, or other NSAIDs unless your doctor says it is okay.  Wear loose clothes. Do not wear anything tight around your waist.  Raise (elevate) the head of your bed about 6 inches (15 cm).  Avoid bending over if this makes your symptoms worse.  Keep all follow-up visits as told by your doctor. This is important. Contact a doctor if:  You have new symptoms.  You lose weight and you do not know why.  You have trouble swallowing or it hurts to swallow.  You have wheezing or a cough that keeps happening.  Your symptoms do not get better with treatment.  You have a hoarse  voice. Get help right away if:  You have pain in your arms, neck, jaw, teeth, or back.  You feel sweaty, dizzy, or light-headed.  You have chest pain or shortness of breath.  You throw up (vomit) and your throw-up looks like blood or coffee grounds.  You pass out (faint).  Your poop (stool) is bloody or black.  You cannot swallow, drink, or eat. Summary  If a person has gastroesophageal reflux disease (GERD), food and stomach acid move back up into the esophagus and cause symptoms or problems such as damage to the esophagus.  Treatment will depend on how bad your symptoms are.  Follow a diet as told by your doctor.  Take all medicines only as told by your doctor. This information is not intended to replace advice given to you by your health care provider. Make sure you discuss any  questions you have with your health care provider. Document Revised: 05/27/2018 Document Reviewed: 05/27/2018 Elsevier Patient Education  [place].

## 2020-09-05 NOTE — Progress Notes (Signed)
Patient ID: Donna Kim, female    DOB: [birth year], 39 y.o.   MRN: [id number]   Chief Complaint  Patient presents with  . Gastroesophageal Reflux   Subjective:  CC: pain from reflux and gallstone  HPI  reflux. Having pain in back and around ribs. Happened one time a couple of weeks ago. Issues started in May and June after eating pizza and drinking alcohol on Friday nights. Would wake up on Saturday mornings in severe pain. Went to ED 6/19 and received CT scan and ultrasound, which found 10 mm gallstone. She has a history of untreated reflux and now gallstone. She is here to discuss both of these issues.    Medical History Saylee has a past medical history of ADHD, Back pain (11/12/2013), Cholestasis (08/2017), Contraceptive management (08/12/2014), Headache, Mental disorder, No pertinent past medical history, Postpartum bleeding (07/15/2014), Rosacea, UTI (urinary tract infection), and Vaginal Pap smear, abnormal.   Outpatient Encounter Medications as of 09/05/2020  Medication Sig  . Coenzyme Q10 (COQ10 PO) Take by mouth.  Marland Kitchen ibuprofen (ADVIL) 800 MG tablet Take 800 mg by mouth daily as needed for mild pain or moderate pain.   . Multiple Vitamin (MULTIVITAMIN) tablet Take 2 tablets by mouth in the morning and at bedtime.   . Omega-3 Fatty Acids (OMEGA 3 PO) Take 2 capsules by mouth in the morning and at bedtime.   . SUMAtriptan (IMITREX) 100 MG tablet Take 1 tablet earliest onset of migraine.  May repeat in 2 hours if headache persists or recurs.  Maximum 2 tablets in 24 hours.  . [DISCONTINUED] ciprofloxacin (CIPRO) 500 MG tablet Take 1 tablet (500 mg total) by mouth 2 (two) times daily.  . [DISCONTINUED] HYDROcodone-acetaminophen (NORCO/VICODIN) 5-325 MG tablet Take 2 tablets by mouth every 4 (four) hours as needed.   No facility-administered encounter medications on file as of 09/05/2020.     Review of Systems  Constitutional: Negative for fever.  HENT: Negative.   Eyes:  Negative.   Respiratory: Positive for cough.        Chronic due to reflux.   Cardiovascular: Negative.   Gastrointestinal:       Pain located at right shoulder blade and radiates around the right side of abdomen. Not hurting currently.  Endocrine: Negative.   Genitourinary: Negative.   Musculoskeletal: Negative.      Vitals BP 110/70   Pulse 98   Temp 98.4 F (36.9 C)   Ht 5\' 5"  (1.651 m)   Wt 235 lb (106.6 kg)   SpO2 99%   BMI 39.11 kg/m   Objective:   Physical Exam Vitals and nursing note reviewed.  Constitutional:      Appearance: Normal appearance.  Cardiovascular:     Rate and Rhythm: Normal rate and regular rhythm.     Heart sounds: Normal heart sounds.  Pulmonary:     Effort: Pulmonary effort is normal.     Breath sounds: Normal breath sounds.  Abdominal:     General: There is no distension.     Palpations: Abdomen is soft.     Tenderness: There is no abdominal tenderness.  Skin:    General: Skin is warm and dry.  Neurological:     Mental Status: She is alert and oriented to person, place, and time.  Psychiatric:        Mood and Affect: Mood normal.        Behavior: Behavior normal.        Thought Content: Thought content normal.  Judgment: Judgment normal.      Assessment and Plan   1. Gastroesophageal reflux disease without esophagitis  2. Calculus of gallbladder without cholecystitis without obstruction   Here today to discuss nonpharmacological approaches to treating her reflux and gallbladder stone.  She has never been on medication for reflux and she says she has had this problem for at least 10 years.  Her gallstone was diagnosed in June at the emergency room.  Information given on diet and foods to avoid with these issues.  She wishes to try lifestyle modifications for 3 months and see how diet changes will help with her situation.  I explained that it is probably prudent to go to a GI specialist for further investigation of her  untreated long term reflux.  She understands that untreated reflux could lead to serious complications in the future, such as esophageal cancer.   Discussed importance of weight loss and its role in improving many chronic conditions.   Agrees with plan of care discussed today. Understands warning signs to seek further care: worsening reflux, pain from gallstone that does not resolve, and fever, nausea and vomiting.  Understands to follow-up in three months after instituting lifestyle and diet changes.   Pecolia Ades, FNP-C 09/05/2020

## 2020-09-12 ENCOUNTER — Encounter: Payer: Self-pay | Admitting: Family Medicine

## 2020-12-04 NOTE — Progress Notes (Signed)
NEUROLOGY FOLLOW UP OFFICE NOTE  Donna Kim CP:[id number]   Subjective:  Donna Kim is a 40 year old female who follows up for migraines.  UPDATE: Other than a severe migraine last week, her headaches have been stable, infrequent and manageable.  She had COVID in January-February.  During that time, she had brain fog.  She had her copper IUD removed in May.  Since then, she would have 2 days of brain fog, similar to when she had COVID, about a couple of days prior to onset of her period.  She cannot think things or follow conversations well.  Usually not associated with headache.  She typically doesn't drive much anyway so unsure how it would affect her driving.  However, she can still perform daily tasks such as fixing dinner.  She also has been more irritable but denies any noticeable depression.  Over the past year, she has improved her diet and lost weight.  However, she gained some weight over the holidays, which she attributes to the reason for her recent migraine.    Intensity:  severe Duration:  About 2 hours with sumatriptan Frequency:  No migraines in past 30 days  Current NSAIDS: ibuprofen Current analgesics: acetaminophen Current triptans: sumatriptan 100mg  Current ergotamine: none Current anti-emetic: none Current muscle relaxants: none Current anti-anxiolytic: none Current sleep aide: none Current Antihypertensive medications: none Current Antidepressant medications: none Current Anticonvulsant medications: none Current anti-CGRP: none Current Vitamins/Herbal/Supplements: magnesium, coenzyme Q10, riboflavin Current Antihistamines/Decongestants: none Other therapy: none Hormone/birth control: none Other medications: none   HISTORY: She started having migraines with headache, photophobia and phonophobia in 2010 but responded to chiropractic therapy. Once in a while, she would have an ocular migraine consisting of scintillating  fortification described as jagged geometric shapes of pulsating color in the center of her vision that would slowly move out of of view, lasting 30 minutes. Sometimes a headache may follow but not always.  In August, during her third trimester, she began having frequent episodes of ocular migraine. She would also have other visual aura described as fireworks. She developed trouble talking, felt off-balance and dizziness. MRI and MRV of the head without contrast on 07/27/2019 was personally reviewed and showed nonspecific scattered punctate subcortical hyperintensities but no acute intracranial abnormality and MRV was negative for dural sinus thrombosis. Symptoms calmed down and she gave birth to her son on October 18. She had her copper IUD placed in late November. In December, she began experiencing daily to near daily headaches. They have since reduced in frequency, occurring 1 to 2 times a week and lasting 2 to 3 hours. They are moderate to severe right sided headaches, either can be pounding, pressure, sharp/stabbing pain with right eye lacrimation, photophobia, phonophobia but not much nausea. Question whether they are hormonal as they were aggravated following IUD. Change in barometric pressure also a trigger.  Fioricet is helpful.   She was breastfeeding but has recently almost stopped. She may still breastfeed in the middle of the night.    Past NSAIDS:none Past analgesics: Fioricet Past abortive triptans:none Past abortive ergotamine:none Past muscle relaxants:none Past anti-emetic: Zofran ODT 8mg ; Promethazine 25mg  Past antihypertensive medications: amlodipine Past antidepressant medications: Sertraline 50mg , nortriptyline 10mg  (noise sensitivity) Past anticonvulsant medications:zonisamide 100mg  Past anti-CGRP: none Past vitamins/Herbal/Supplements: riboflavin Past antihistamines/decongestants: meclizine Other past therapies:none   Family history  of headache:Unknown (adopted)   PAST MEDICAL HISTORY: Past Medical History:  Diagnosis Date  . ADHD   . Back pain 11/12/2013  . Cholestasis 08/2017  .  Contraceptive management 08/12/2014  . Headache   . Mental disorder    anxiety  . No pertinent past medical history   . Postpartum bleeding 07/15/2014  . Rosacea   . UTI (urinary tract infection)   . Vaginal Pap smear, abnormal     MEDICATIONS: Current Outpatient Medications on File Prior to Visit  Medication Sig Dispense Refill  . Coenzyme Q10 (COQ10 PO) Take by mouth.    Marland Kitchen ibuprofen (ADVIL) 800 MG tablet Take 800 mg by mouth daily as needed for mild pain or moderate pain.     . Multiple Vitamin (MULTIVITAMIN) tablet Take 2 tablets by mouth in the morning and at bedtime.     . Omega-3 Fatty Acids (OMEGA 3 PO) Take 2 capsules by mouth in the morning and at bedtime.     . SUMAtriptan (IMITREX) 100 MG tablet Take 1 tablet earliest onset of migraine.  May repeat in 2 hours if headache persists or recurs.  Maximum 2 tablets in 24 hours. 10 tablet 3   No current facility-administered medications on file prior to visit.    ALLERGIES: Allergies  Allergen Reactions  . Cefzil [Cefprozil] Itching and Nausea And Vomiting    FAMILY HISTORY: Family History  Adopted: Yes  Problem Relation Age of Onset  . ADD / ADHD Daughter     SOCIAL HISTORY: Social History   Socioeconomic History  . Marital status: Married    Spouse name: Not on file  . Number of children: 4  . Years of education: 62  . Highest education level: Not on file  Occupational History  . Occupation: stay at home mom  Tobacco Use  . Smoking status: Never Smoker  . Smokeless tobacco: Never Used  Vaping Use  . Vaping Use: Never used  Substance and Sexual Activity  . Alcohol use: No  . Drug use: No  . Sexual activity: Yes    Birth control/protection: None, Condom  Other Topics Concern  . Not on file  Social History Narrative   Right handed   Drinks occ  coffee   Two story home   Social Determinants of Health   Financial Resource Strain: Not on file  Food Insecurity: Not on file  Transportation Needs: Not on file  Physical Activity: Not on file  Stress: Not on file  Social Connections: Not on file  Intimate Partner Violence: Not on file     Objective:  Blood pressure (!) 134/91, pulse 87, height 5\' 5"  (1.651 m), weight 232 lb 3.2 oz (105.3 kg), SpO2 98 %, not currently breastfeeding. General: No acute distress.  Patient appears well-groomed.   Head:  Normocephalic/atraumatic Eyes:  Fundi examined but not visualized Neck: supple, no paraspinal tenderness, full range of motion Heart:  Regular rate and rhythm Lungs:  Clear to auscultation bilaterally Back: No paraspinal tenderness Neurological Exam: alert and oriented to person, place, and time. Attention span and concentration intact, recent and remote memory intact, fund of knowledge intact.  Speech fluent and not dysarthric, language intact.  CN II-XII intact. Bulk and tone normal, muscle strength 5/5 throughout.  Sensation to light touch  intact.  Deep tendon reflexes 2+ throughout.  Finger to nose testing intact.  Gait normal, Romberg negative.   Assessment/Plan:   1.  Cognitive changes - only occurs around her period, wondering if it is hormonal.  Migraine aura cannot be ruled out but she usually does not have any associated headache. 2.  Migraine with aura, without status migrainosus, not intractable  1.  Check MRI of brain with and without contrast 2.  Check routine EEG 3.  Migraine prevention:  Mg, B2, CoQ10 4.  Migraine rescue:  Sumatriptan 100mg  5.  Limit use of pain relievers to no more than 2 days out of week to prevent risk of rebound or medication-overuse headache. 6.  Keep headache diary 7.  Follow up 6 months or sooner  Metta Clines, DO  CC: Malena Lovena Le, DO

## 2020-12-06 ENCOUNTER — Ambulatory Visit (INDEPENDENT_AMBULATORY_CARE_PROVIDER_SITE_OTHER): Payer: Medicaid Other | Admitting: Neurology

## 2020-12-06 ENCOUNTER — Other Ambulatory Visit: Payer: Self-pay

## 2020-12-06 ENCOUNTER — Encounter: Payer: Self-pay | Admitting: Neurology

## 2020-12-06 VITALS — BP 134/91 | HR 87 | Ht 65.0 in | Wt 232.2 lb

## 2020-12-06 DIAGNOSIS — G43109 Migraine with aura, not intractable, without status migrainosus: Secondary | ICD-10-CM

## 2020-12-06 DIAGNOSIS — R404 Transient alteration of awareness: Secondary | ICD-10-CM

## 2020-12-06 NOTE — Patient Instructions (Addendum)
1.  MRI of brain with and without contrast. .We have sent a referral to [facility] for your MRI and they will call you directly to schedule your appointment. They are located at [address] [place] [employer] If you need to contact them directly please call [phone].  2.  Routine EEG 3.  Further recommendations pending results

## 2020-12-07 ENCOUNTER — Ambulatory Visit: Payer: Medicaid Other | Admitting: Family Medicine

## 2020-12-13 ENCOUNTER — Other Ambulatory Visit: Payer: Medicaid Other

## 2020-12-22 ENCOUNTER — Ambulatory Visit: Payer: 59 | Admitting: Neurology

## 2020-12-25 ENCOUNTER — Ambulatory Visit (INDEPENDENT_AMBULATORY_CARE_PROVIDER_SITE_OTHER): Payer: Medicaid Other | Admitting: Neurology

## 2020-12-25 ENCOUNTER — Other Ambulatory Visit: Payer: Self-pay

## 2020-12-25 DIAGNOSIS — R404 Transient alteration of awareness: Secondary | ICD-10-CM | POA: Diagnosis not present

## 2020-12-26 ENCOUNTER — Ambulatory Visit: Payer: Medicaid Other | Admitting: Family Medicine

## 2020-12-26 NOTE — Procedures (Signed)
ELECTROENCEPHALOGRAM REPORT  Date of Study: 12/25/2020  Patient's Name: Donna Kim MRN: [id number] Date of Birth: 09/17/1981  Clinical History: 40 year old female for cognitive changes occurring around her period.    Medications: Sumatriptan 100mg  Ibuprofen 800mg  CoQ10 Multivitamin Omega 3  Technical Summary: A multichannel digital EEG recording measured by the international 10-20 system with electrodes applied with paste and impedances below 5000 ohms performed in our laboratory with EKG monitoring in an awake and asleep patient.  Hyperventilation was not performed as patient is wearing a face mask due to the COVID-19 pandemic.  Photic stimulation was performed.  The digital EEG was referentially recorded, reformatted, and digitally filtered in a variety of bipolar and referential montages for optimal display.    Description: The patient is awake and asleep during the recording.  During maximal wakefulness, there is a symmetric, medium voltage 10-11 Hz posterior dominant rhythm that attenuates with eye opening.  The record is symmetric.  During drowsiness and sleep, there is an increase in theta slowing of the background.  Vertex waves and symmetric sleep spindles were seen.  Photic stimulation did not elicit any abnormalities.  There were no epileptiform discharges or electrographic seizures seen.    EKG lead was unremarkable.  Impression: This awake and asleep EEG is normal.    Clinical Correlation: A normal EEG does not exclude a clinical diagnosis of epilepsy.  If further clinical questions remain, prolonged EEG may be helpful.  Clinical correlation is advised.   Metta Clines, DO

## 2020-12-27 NOTE — Progress Notes (Signed)
Pt advised of EEG results.

## 2020-12-28 ENCOUNTER — Other Ambulatory Visit: Payer: Self-pay

## 2020-12-28 ENCOUNTER — Ambulatory Visit
Admission: RE | Admit: 2020-12-28 | Discharge: 2020-12-28 | Disposition: A | Discharge status: Home / Self Care | Payer: Medicaid Other | Source: Ambulatory Visit | Attending: Neurology | Admitting: Neurology

## 2020-12-28 DIAGNOSIS — G43109 Migraine with aura, not intractable, without status migrainosus: Secondary | ICD-10-CM

## 2020-12-28 DIAGNOSIS — J320 Chronic maxillary sinusitis: Secondary | ICD-10-CM | POA: Diagnosis not present

## 2020-12-28 DIAGNOSIS — J3489 Other specified disorders of nose and nasal sinuses: Secondary | ICD-10-CM | POA: Diagnosis not present

## 2020-12-28 MED ORDER — GADOBENATE DIMEGLUMINE 529 MG/ML IV SOLN
20.0000 mL | Freq: Once | INTRAVENOUS | Status: AC | PRN
  Administered 2020-12-28: 20 mL via INTRAVENOUS

## 2020-12-29 NOTE — Progress Notes (Signed)
Pt advised of her MRI results.

## 2021-01-08 ENCOUNTER — Ambulatory Visit: Payer: Medicaid Other | Admitting: Family Medicine

## 2021-02-05 ENCOUNTER — Ambulatory Visit: Payer: Medicaid Other | Admitting: Family Medicine

## 2021-02-07 ENCOUNTER — Encounter: Payer: Self-pay | Admitting: Family Medicine

## 2021-02-07 ENCOUNTER — Other Ambulatory Visit: Payer: Self-pay

## 2021-02-07 ENCOUNTER — Ambulatory Visit (INDEPENDENT_AMBULATORY_CARE_PROVIDER_SITE_OTHER): Payer: Medicaid Other | Admitting: Family Medicine

## 2021-02-07 VITALS — BP 122/84 | HR 82 | Temp 97.9°F | Ht 65.0 in | Wt 233.0 lb

## 2021-02-07 DIAGNOSIS — K429 Umbilical hernia without obstruction or gangrene: Secondary | ICD-10-CM | POA: Diagnosis not present

## 2021-02-07 DIAGNOSIS — D229 Melanocytic nevi, unspecified: Secondary | ICD-10-CM

## 2021-02-07 DIAGNOSIS — E236 Other disorders of pituitary gland: Secondary | ICD-10-CM | POA: Diagnosis not present

## 2021-02-07 NOTE — Patient Instructions (Signed)
Do labs at 8am.

## 2021-02-07 NOTE — Progress Notes (Signed)
Patient ID: Donna Kim, female    DOB: 1981-03-23, 40 y.o.   MRN: [id number]   Chief Complaint  Patient presents with  . Hernia   Subjective:    HPI  Cc- hernia pt wants bloodwork for hormone imbalances.   Concerns about umbilical hernia.   Check mole on right side of face.   Pt stating feeling she has "a lot of things going on." 2017 tinnitus and had lots of MRIs and had h/o migraines. Then wondered if has empty sella. Neuro- ordered mri. Due to having inc in migraines and headaches. Brain fog she thought like when having covid a similar brain fog.  Seeing the chiropractor Dr. Lovena Le. Giving her -k2 d3 supplement.  Saw NP karen, 9/21- lost 12 -13 lbs, but then gained it back. Not not really moving.  trouble with sleep. Might be awake for 1-2 hrs.  Low libido.  Not on bcp.  Not eating as long portions and dec processed foods. More mood swings and joint pain and pressure behind eyes.  Seeing counselor, and helping with depression.  Cycles ranging from 27-33, mostly around 27-28 days. Had copper Iud may 2021 and got rid of it and had some weight gain. Donna Kim born [birth year]. Denies nipple discharge and has regular periods.  Starting to walk again for exercising. Has umbilical hernia concerned about her abd.   Small mole on rt anterior ear/on the cheek. Slowly gotten bigger, rt on rt cheek.  Migraines improved in last 2 wks,, back on riboflavin and coq10 and taking mvt.  And imitrex and magnesium   Medical History Damary has a past medical history of ADHD, Back pain (11/12/2013), Cholestasis (08/2017), Contraceptive management (08/12/2014), Headache, Mental disorder, No pertinent past medical history, Postpartum bleeding (07/15/2014), Rosacea, UTI (urinary tract infection), and Vaginal Pap smear, abnormal.   Outpatient Encounter Medications as of 02/07/2021  Medication Sig  . calcium-vitamin D (OSCAL WITH D) 500-200 MG-UNIT tablet Take 1 tablet by mouth.  .  Coenzyme Q10 (COQ10 PO) Take by mouth.  Marland Kitchen ibuprofen (ADVIL) 800 MG tablet Take 800 mg by mouth daily as needed for mild pain or moderate pain.   . Multiple Vitamin (MULTIVITAMIN) tablet Take 2 tablets by mouth in the morning and at bedtime.   Marland Kitchen OVER THE COUNTER MEDICATION Vit d 2 Vit e  elderberry  . SUMAtriptan (IMITREX) 100 MG tablet Take 1 tablet earliest onset of migraine.  May repeat in 2 hours if headache persists or recurs.  Maximum 2 tablets in 24 hours.  . [DISCONTINUED] Omega-3 Fatty Acids (OMEGA 3 PO) Take 2 capsules by mouth in the morning and at bedtime.    No facility-administered encounter medications on file as of 02/07/2021.     Review of Systems  Constitutional: Negative for chills and fever.  HENT: Negative for congestion, rhinorrhea and sore throat.   Respiratory: Negative for cough, shortness of breath and wheezing.   Cardiovascular: Negative for chest pain and leg swelling.  Gastrointestinal: Negative for abdominal pain, diarrhea, nausea and vomiting.  Genitourinary: Negative for dysuria and frequency.  Musculoskeletal: Negative for arthralgias and back pain.  Skin: Negative for rash.       +skin lesion -rt cheek  Neurological: Negative for dizziness, weakness and headaches.     Vitals BP 122/84   Pulse 82   Temp 97.9 F (36.6 C)   Ht 5\' 5"  (1.651 m)   Wt 233 lb (105.7 kg)   SpO2 98%   BMI 38.77 kg/m   Objective:  Physical Exam Vitals and nursing note reviewed.  Constitutional:      General: She is not in acute distress.    Appearance: Normal appearance. She is obese. She is not ill-appearing.  HENT:     Head: Normocephalic and atraumatic.     Nose: Nose normal.     Mouth/Throat:     Mouth: Mucous membranes are moist.     Pharynx: Oropharynx is clear.  Eyes:     Extraocular Movements: Extraocular movements intact.     Conjunctiva/sclera: Conjunctivae normal.     Pupils: Pupils are equal, round, and reactive to light.  Cardiovascular:      Rate and Rhythm: Normal rate and regular rhythm.     Pulses: Normal pulses.     Heart sounds: Normal heart sounds.  Pulmonary:     Effort: Pulmonary effort is normal.     Breath sounds: Normal breath sounds. No wheezing, rhonchi or rales.  Abdominal:     General: Bowel sounds are normal. There is no distension.     Palpations: Abdomen is soft. There is no mass.     Tenderness: There is no abdominal tenderness. There is no guarding or rebound.     Hernia: A hernia (umbilical-reducible, small) is present.  Musculoskeletal:        General: Normal range of motion.     Right lower leg: No edema.     Left lower leg: No edema.  Skin:    General: Skin is warm and dry.     Findings: No lesion or rash.     Comments: +skin mole on rt cheek about 0.5cm near rt ear. No bleeding, erythema or drainage.  Neurological:     General: No focal deficit present.     Mental Status: She is alert and oriented to person, place, and time.     Cranial Nerves: No cranial nerve deficit.  Psychiatric:        Mood and Affect: Mood normal.        Behavior: Behavior normal.      Assessment and Plan   1. Empty sella (HCC) - Prolactin - TSH - CBC - T4, free - LH - FSH - Estradiol - Testosterone - Growth hormone - Cortisol-am, blood  2. Nevus  3. Umbilical hernia without obstruction and without gangrene   Pt to get labs for the concern of partially empty sella seen on MRI.  Nevus vs. Seborrhea keratosis- likely benign.  Pt to call or rto if worsening or enlarging.  Has gyn- seeing midwife.  F/u -Family tree.  Last gyn exam in 05/2020.   Return in about 4 weeks (around 03/07/2021) for cpe/ review labs.  counseling time- spent greater than 45min on counseling patient, coordination of care, reviewing labs and imaging and charting.

## 2021-02-21 ENCOUNTER — Other Ambulatory Visit: Payer: Self-pay

## 2021-02-21 ENCOUNTER — Ambulatory Visit (INDEPENDENT_AMBULATORY_CARE_PROVIDER_SITE_OTHER): Payer: Medicaid Other | Admitting: Internal Medicine

## 2021-02-21 ENCOUNTER — Other Ambulatory Visit (INDEPENDENT_AMBULATORY_CARE_PROVIDER_SITE_OTHER): Payer: Self-pay | Admitting: Internal Medicine

## 2021-02-21 ENCOUNTER — Encounter (INDEPENDENT_AMBULATORY_CARE_PROVIDER_SITE_OTHER): Payer: Self-pay | Admitting: Internal Medicine

## 2021-02-21 VITALS — BP 140/70 | HR 88 | Temp 97.9°F | Ht 65.0 in | Wt 231.0 lb

## 2021-02-21 DIAGNOSIS — E6609 Other obesity due to excess calories: Secondary | ICD-10-CM | POA: Diagnosis not present

## 2021-02-21 DIAGNOSIS — E7800 Pure hypercholesterolemia, unspecified: Secondary | ICD-10-CM | POA: Diagnosis not present

## 2021-02-21 DIAGNOSIS — E559 Vitamin D deficiency, unspecified: Secondary | ICD-10-CM | POA: Diagnosis not present

## 2021-02-21 DIAGNOSIS — R5383 Other fatigue: Secondary | ICD-10-CM

## 2021-02-21 DIAGNOSIS — F520 Hypoactive sexual desire disorder: Secondary | ICD-10-CM | POA: Diagnosis not present

## 2021-02-21 DIAGNOSIS — L659 Nonscarring hair loss, unspecified: Secondary | ICD-10-CM | POA: Diagnosis not present

## 2021-02-21 DIAGNOSIS — Z6839 Body mass index (BMI) 39.0-39.9, adult: Secondary | ICD-10-CM

## 2021-02-21 DIAGNOSIS — R5381 Other malaise: Secondary | ICD-10-CM

## 2021-02-21 DIAGNOSIS — E282 Polycystic ovarian syndrome: Secondary | ICD-10-CM

## 2021-02-21 DIAGNOSIS — G4700 Insomnia, unspecified: Secondary | ICD-10-CM

## 2021-02-21 MED ORDER — PROGESTERONE 200 MG PO CAPS
200.0000 mg | ORAL_CAPSULE | Freq: Every day | ORAL | 3 refills | Status: DC
Start: 2021-02-21 — End: 2021-06-19

## 2021-02-21 NOTE — Progress Notes (Signed)
Metrics: Intervention Frequency ACO  Documented Smoking Status Yearly  Screened one or more times in 24 months  Cessation Counseling or  Active cessation medication Past 24 months  Past 24 months   Guideline developer: UpToDate (See UpToDate for funding source) Date Released: 2014       Wellness Office Visit  Subjective:  Patient ID: Donna Kim, female    DOB: 02/22/1981  Age: 40 y.o. MRN: [id number]  CC: This delightful 40 year old lady comes to our practice to establish care and seek wellness. HPI  She is frustrated that she cannot lose weight.  She also describes migrainous headaches, worse during her menstrual cycle.  She describes insomnia. She describes decreased libido.  This has been present for some time. She describes hair loss and fatigue.  She wonders whether she has a thyroid problem. She did have an MRI which showed a partially empty sella turcica which was concerning to her but neurologist has seen her and does not feel that this is a major problem as this sometimes is a incidental finding. She would like to be healthier. She has a history of hypercholesterolemia and vitamin D deficiency in the past.  She tells me she takes vitamin D3 about 1000 units a day. Past Medical History:  Diagnosis Date  . ADHD   . Back pain 11/12/2013  . Cholestasis 08/2017  . Contraceptive management 08/12/2014  . Headache   . Mental disorder    anxiety  . No pertinent past medical history   . Postpartum bleeding 07/15/2014  . Rosacea   . UTI (urinary tract infection)   . Vaginal Pap smear, abnormal    Past Surgical History:  Procedure Laterality Date  . WISDOM TOOTH EXTRACTION       Family History  Adopted: Yes  Problem Relation Age of Onset  . 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   Social History   Tobacco Use  . Smoking status: Never Smoker  .  Smokeless tobacco: Never Used  Substance Use Topics  . Alcohol use: No    Current Meds  Medication Sig  . calcium-vitamin D (OSCAL WITH D) 500-200 MG-UNIT tablet Take 1 tablet by mouth.  . Coenzyme Q10 (COQ10 PO) Take by mouth.  Marland Kitchen ibuprofen (ADVIL) 800 MG tablet Take 800 mg by mouth daily as needed for mild pain or moderate pain.   . Multiple Vitamin (MULTIVITAMIN) tablet Take 2 tablets by mouth in the morning and at bedtime.   Marland Kitchen OVER THE COUNTER MEDICATION Vit d 2 Vit e  elderberry  . progesterone (PROMETRIUM) 200 MG capsule Take 1 capsule (200 mg total) by mouth daily.  . SUMAtriptan (IMITREX) 100 MG tablet Take 1 tablet earliest onset of migraine.  May repeat in 2 hours if headache persists or recurs.  Maximum 2 tablets in 24 hours.     Flowsheet Row Initial Prenatal from 03/17/2019 in [facility]  PHQ-9 Total Score 1      Objective:   Today's Vitals: BP 140/70 (BP Location: Right Arm, Patient Position: Sitting, Cuff Size: Normal)   Pulse 88   Temp 97.9 F (36.6 C) (Temporal)   Ht 5\' 5"  (1.651 m)   Wt 231 lb (104.8 kg)   SpO2 99%   BMI 38.44 kg/m  Vitals with BMI 02/21/2021 02/07/2021 12/06/2020  Height 5\' 5"  5\' 5"  5\' 5"   Weight 231  lbs 233 lbs 232 lbs 3 oz  BMI 38.44 47.65 46.50  Systolic 354 656 812  Diastolic 70 84 91  Pulse 88 82 87  Some encounter information is confidential and restricted. Go to Review Flowsheets activity to see all data.     Physical Exam  She looks systemically well.  She is obese.  Systolic blood pressure elevated.  She does not appear to have any significant acne or hirsutism.     Assessment   1. Class 2 obesity due to excess calories without serious comorbidity with body mass index (BMI) of 39.0 to 39.9 in adult   2. Vitamin D deficiency disease   3. Pure hypercholesterolemia   4. Hair loss   5. Insomnia, unspecified type   6. Hypoactive sexual desire disorder   7. Malaise and fatigue       Tests ordered Orders  Placed This Encounter  Procedures  . Follicle stimulating hormone  . Luteinizing hormone  . Testos,Total,Free and SHBG (Female)  . T3, free  . T4, free  . TSH  . Lipid panel  . VITAMIN D 25 Hydroxy (Vit-D Deficiency, Fractures)     Plan: 1. Blood work is ordered. 2. I will see her back early next week to discuss all these results and then further recommendations.   Meds ordered this encounter  Medications  . progesterone (PROMETRIUM) 200 MG capsule    Sig: Take 1 capsule (200 mg total) by mouth daily.    Dispense:  30 capsule    Refill:  3    Isidor Bromell Luther Parody, MD

## 2021-02-26 ENCOUNTER — Other Ambulatory Visit: Payer: Self-pay

## 2021-02-26 ENCOUNTER — Encounter (INDEPENDENT_AMBULATORY_CARE_PROVIDER_SITE_OTHER): Payer: Self-pay | Admitting: Internal Medicine

## 2021-02-26 ENCOUNTER — Ambulatory Visit (INDEPENDENT_AMBULATORY_CARE_PROVIDER_SITE_OTHER): Payer: Medicaid Other | Admitting: Internal Medicine

## 2021-02-26 VITALS — BP 122/71 | HR 95 | Temp 97.7°F | Resp 18 | Ht 65.0 in | Wt 231.0 lb

## 2021-02-26 DIAGNOSIS — R5383 Other fatigue: Secondary | ICD-10-CM | POA: Diagnosis not present

## 2021-02-26 DIAGNOSIS — R5381 Other malaise: Secondary | ICD-10-CM

## 2021-02-26 DIAGNOSIS — R6889 Other general symptoms and signs: Secondary | ICD-10-CM

## 2021-02-26 DIAGNOSIS — E669 Obesity, unspecified: Secondary | ICD-10-CM

## 2021-02-26 DIAGNOSIS — J320 Chronic maxillary sinusitis: Secondary | ICD-10-CM | POA: Diagnosis not present

## 2021-02-26 DIAGNOSIS — E785 Hyperlipidemia, unspecified: Secondary | ICD-10-CM | POA: Diagnosis not present

## 2021-02-26 DIAGNOSIS — L659 Nonscarring hair loss, unspecified: Secondary | ICD-10-CM

## 2021-02-26 LAB — LIPID PANEL
Cholesterol: 194 mg/dL (ref <200)
HDL: 48 mg/dL — ABNORMAL LOW (ref >=50)
LDL Cholesterol (Calc): 114 mg/dL (calc) — ABNORMAL HIGH
Non-HDL Cholesterol (Calc): 146 mg/dL (calc) — ABNORMAL HIGH (ref <130)
Total CHOL/HDL Ratio: 4 (calc) (ref <5.0)
Triglycerides: 203 mg/dL — ABNORMAL HIGH (ref <150)

## 2021-02-26 LAB — TSH: TSH: 0.99 mIU/L

## 2021-02-26 LAB — T3, FREE: T3, Free: 3.1 pg/mL (ref 2.3–4.2)

## 2021-02-26 LAB — T4, FREE: Free T4: 1 ng/dL (ref 0.8–1.8)

## 2021-02-26 LAB — TESTOS,TOTAL,FREE AND SHBG (FEMALE)
Free Testosterone: 2.5 pg/mL (ref 0.1–6.4)
Sex Hormone Binding: 42 nmol/L (ref 17–124)
Testosterone, Total, LC-MS-MS: 21 ng/dL (ref 2–45)

## 2021-02-26 LAB — LUTEINIZING HORMONE: LH: 4.5 m[IU]/mL

## 2021-02-26 LAB — VITAMIN D 25 HYDROXY (VIT D DEFICIENCY, FRACTURES): Vit D, 25-Hydroxy: 36 ng/mL (ref 30–100)

## 2021-02-26 LAB — FOLLICLE STIMULATING HORMONE: FSH: 5.2 m[IU]/mL

## 2021-02-26 MED ORDER — AMOXICILLIN-POT CLAVULANATE 875-125 MG PO TABS
1.0000 | ORAL_TABLET | Freq: Two times a day (BID) | ORAL | 0 refills | Status: DC
Start: 2021-02-26 — End: 2021-04-04

## 2021-02-26 MED ORDER — NP THYROID 60 MG PO TABS
60.0000 mg | ORAL_TABLET | Freq: Every day | ORAL | 3 refills | Status: DC
Start: 2021-02-26 — End: 2021-03-23

## 2021-02-26 NOTE — Progress Notes (Signed)
Metrics: Intervention Frequency ACO  Documented Smoking Status Yearly  Screened one or more times in 24 months  Cessation Counseling or  Active cessation medication Past 24 months  Past 24 months   Guideline developer: UpToDate (See UpToDate for funding source) Date Released: 2014       Wellness Office Visit  Subjective:  Patient ID: Donna Kim, female    DOB: 27-May-1981  Age: 40 y.o. MRN: [id number]  CC: This lady comes in to review her blood work and further recommendations. HPI She has suboptimal vitamin D levels. She has normal thyroid levels but clearly symptoms of thyroid hypofunction. Her FSH/LH ratio is almost 1-1 with a possible diagnosis of PCOS. I discussed all these in detail with her. Her lipid panel shows typical features of insulin resistance with increasing triglycerides and reduction in HDL cholesterol. She also describes a several month sinus congestion symptoms and MRI does seem to imply that there may be infection in the maxillary sinuses, especially on the left side.  Past Medical History:  Diagnosis Date  . ADHD   . Back pain 11/12/2013  . Cholestasis 08/2017  . Contraceptive management 08/12/2014  . Headache   . Mental disorder    anxiety  . No pertinent past medical history   . Postpartum bleeding 07/15/2014  . Rosacea   . UTI (urinary tract infection)   . Vaginal Pap smear, abnormal    Past Surgical History:  Procedure Laterality Date  . WISDOM TOOTH EXTRACTION       Family History  Adopted: Yes  Problem Relation Age of Onset  . 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   Social History   Tobacco Use  . Smoking status: Never Smoker  . Smokeless tobacco: Never Used  Substance Use Topics  . Alcohol use: No    Current Meds  Medication Sig  . amoxicillin-clavulanate (AUGMENTIN) 875-125 MG tablet Take 1 tablet by mouth 2  (two) times daily.  . calcium-vitamin D (OSCAL WITH D) 500-200 MG-UNIT tablet Take 1 tablet by mouth.  . Coenzyme Q10 (COQ10 PO) Take by mouth.  Marland Kitchen ibuprofen (ADVIL) 800 MG tablet Take 800 mg by mouth daily as needed for mild pain or moderate pain.   . Multiple Vitamin (MULTIVITAMIN) tablet Take 2 tablets by mouth in the morning and at bedtime.   . NP THYROID 60 MG tablet Take 1 tablet (60 mg total) by mouth daily before breakfast.  . OVER THE COUNTER MEDICATION Vit d 2 Vit e  elderberry  . progesterone (PROMETRIUM) 200 MG capsule Take 1 capsule (200 mg total) by mouth daily.  . SUMAtriptan (IMITREX) 100 MG tablet Take 1 tablet earliest onset of migraine.  May repeat in 2 hours if headache persists or recurs.  Maximum 2 tablets in 24 hours.     Flowsheet Row Initial Prenatal from 03/17/2019 in [facility]  PHQ-9 Total Score 1      Objective:   Today's Vitals: BP 122/71 (BP Location: Right Arm, Patient Position: Sitting, Cuff Size: Normal)   Pulse 95   Temp 97.7 F (36.5 C) (Temporal)   Resp 18   Ht 5\' 5"  (1.651 m)   Wt 231 lb (104.8 kg)   SpO2 97%   BMI 38.44 kg/m  Vitals with BMI 02/26/2021 02/21/2021 02/07/2021  Height 5\' 5"  5\' 5"  5\' 5"   Weight 231 lbs 231  lbs 233 lbs  BMI 38.44 41.58 30.94  Systolic 076 808 811  Diastolic 71 70 84  Pulse 95 88 82  Some encounter information is confidential and restricted. Go to Review Flowsheets activity to see all data.     Physical Exam She remains obese.  Blood pressure is in a good range.      Assessment   1. Hair loss   2. Malaise and fatigue   3. Cold intolerance   4. Chronic maxillary sinusitis   5. Obesity (BMI 30-39.9)   6. Dyslipidemia       Tests ordered No orders of the defined types were placed in this encounter.    Plan: 1. We discussed nutrition in detail with the concept of intermittent fasting combined with a plant-based diet, ensuring adequate hydration and salt intake. 2. I am going to  treat her with Augmentin empirically for possible sinusitis. 3. We discussed her symptoms of thyroid hypofunction, and after shared decision-making, we will start her on NP thyroid 60 mg daily.  I explained possible side effects and how to deal with them. 4. She will take vitamin D3 from [facility] as this seems to achieve consistent levels and the dose she needs to be on his vitamin D3 10,000 units daily. 5. Continue with progesterone at night which seems to be helping her migrainous headaches and she is sleeping better also now. 6. Follow-up in about 6 weeks.  I spent 45 minutes with this patient discussing her results in detail and nutrition as well as thyroid therapy.   Meds ordered this encounter  Medications  . amoxicillin-clavulanate (AUGMENTIN) 875-125 MG tablet    Sig: Take 1 tablet by mouth 2 (two) times daily.    Dispense:  20 tablet    Refill:  0  . NP THYROID 60 MG tablet    Sig: Take 1 tablet (60 mg total) by mouth daily before breakfast.    Dispense:  30 tablet    Refill:  3    Maryuri Warnke Luther Parody, MD

## 2021-02-26 NOTE — Patient Instructions (Signed)
[facility] Dietary Recommendations for Weight Loss What to Avoid . Avoid added sugars o Often added sugar can be found in processed foods such as many condiments, dry cereals, cakes, cookies, chips, crisps, crackers, candies, sweetened drinks, etc.  o Read labels and AVOID/DECREASE use of foods with the following in their ingredient list: Sugar, fructose, high fructose corn syrup, sucrose, glucose, maltose, dextrose, molasses, cane sugar, brown sugar, any type of syrup, agave nectar, etc.   . Avoid snacking in between meals . Avoid foods made with flour o If you are going to eat food made with flour, choose those made with whole-grains; and, minimize your consumption as much as is tolerable . Avoid processed foods o These foods are generally stocked in the middle of the grocery store. Focus on shopping on the perimeter of the grocery.  . Avoid Meat  o We recommend following a plant-based diet at [facility]. Thus, we recommend avoiding meat as a general rule. Consider eating beans, legumes, eggs, and/or dairy products for regular protein sources o If you plan on eating meat limit to 4 ounces of meat at a time and choose lean options such as Fish, chicken, turkey. Avoid red meat intake such as pork and/or steak What to Include . Vegetables o GREEN LEAFY VEGETABLES: Kale, spinach, mustard greens, collard greens, cabbage, broccoli, etc. o OTHER: Asparagus, cauliflower, eggplant, carrots, peas, Brussel sprouts, tomatoes, bell peppers, zucchini, beets, cucumbers, etc. . Grains, seeds, and legumes o Beans: kidney beans, black eyed peas, garbanzo beans, black beans, pinto beans, etc. o Whole, unrefined grains: brown rice, barley, bulgur, oatmeal, etc. . Healthy fats  o Avoid highly processed fats such as vegetable oil o Examples of healthy fats: avocado, olives, virgin olive oil, dark chocolate (?72% Cocoa), nuts (peanuts, almonds, walnuts, cashews, pecans, etc.) . None to Low  Intake of Animal Sources of Protein o Meat sources: chicken, turkey, salmon, tuna. Limit to 4 ounces of meat at one time. o Consider limiting dairy sources, but when choosing dairy focus on: PLAIN Greek yogurt, cottage cheese, high-protein milk . Fruit o Choose berries  When to Eat . Intermittent Fasting: o Choosing not to eat for a specific time period, but DO FOCUS ON HYDRATION when fasting o Multiple Techniques: - Time Restricted Eating: eat 3 meals in a day, each meal lasting no more than 60 minutes, no snacks between meals - 16-18 hour fast: fast for 16 to 18 hours up to 7 days a week. Often suggested to start with 2-3 nonconsecutive days per week.  . Remember the time you sleep is counted as fasting.  . Examples of eating schedule: Fast from 7:00pm-11:00am. Eat between 11:00am-7:00pm.  - 24-hour fast: fast for 24 hours up to every other day. Often suggested to start with 1 day per week . Remember the time you sleep is counted as fasting . Examples of eating schedule:  o Eating day: eat 2-3 meals on your eating day. If doing 2 meals, each meal should last no more than 90 minutes. If doing 3 meals, each meal should last no more than 60 minutes. Finish last meal by 7:00pm. o Fasting day: Fast until 7:00pm.  o IF YOU FEEL UNWELL FOR ANY REASON/IN ANY WAY WHEN FASTING, STOP FASTING BY EATING A NUTRITIOUS SNACK OR LIGHT MEAL o ALWAYS FOCUS ON HYDRATION DURING FASTS - Acceptable Hydration sources: water, broths, tea/coffee (black tea/coffee is best but using a small amount of whole-fat dairy products in coffee/tea is acceptable).  -   Poor Hydration Sources: anything with sugar or artificial sweeteners added to it  These recommendations have been developed for patients that are actively receiving medical care from either Dr. Yareth Macdonnell or Sarah Gray, DNP, NP-C at [facility]. These recommendations are developed for patients with specific medical conditions and are not meant to be  distributed or used by others that are not actively receiving care from either provider listed above at [facility]. It is not appropriate to participate in the above eating plans without proper medical supervision.   Reference: Fung, J. The obesity code. [place]: [place]; 2016.   

## 2021-03-23 MED ORDER — NP THYROID 90 MG PO TABS: 90.0000 mg | ORAL_TABLET | Freq: Every day | ORAL | 3 refills | Status: DC

## 2021-04-02 ENCOUNTER — Telehealth (INDEPENDENT_AMBULATORY_CARE_PROVIDER_SITE_OTHER): Payer: Self-pay | Admitting: Internal Medicine

## 2021-04-02 NOTE — Telephone Encounter (Signed)
Since we have 2 new patients back to back tomorrow, lets try for 3:30 PM tomorrow

## 2021-04-04 ENCOUNTER — Ambulatory Visit (INDEPENDENT_AMBULATORY_CARE_PROVIDER_SITE_OTHER): Payer: Medicaid Other | Admitting: Internal Medicine

## 2021-04-04 ENCOUNTER — Encounter (INDEPENDENT_AMBULATORY_CARE_PROVIDER_SITE_OTHER): Payer: Self-pay | Admitting: Internal Medicine

## 2021-04-04 ENCOUNTER — Other Ambulatory Visit: Payer: Self-pay

## 2021-04-04 ENCOUNTER — Other Ambulatory Visit (INDEPENDENT_AMBULATORY_CARE_PROVIDER_SITE_OTHER): Payer: Self-pay | Admitting: Internal Medicine

## 2021-04-04 VITALS — BP 122/80 | HR 94 | Temp 97.7°F | Ht 65.0 in | Wt 226.8 lb

## 2021-04-04 DIAGNOSIS — R5383 Other fatigue: Secondary | ICD-10-CM | POA: Diagnosis not present

## 2021-04-04 DIAGNOSIS — R6889 Other general symptoms and signs: Secondary | ICD-10-CM

## 2021-04-04 DIAGNOSIS — E785 Hyperlipidemia, unspecified: Secondary | ICD-10-CM

## 2021-04-04 DIAGNOSIS — E559 Vitamin D deficiency, unspecified: Secondary | ICD-10-CM | POA: Diagnosis not present

## 2021-04-04 DIAGNOSIS — F39 Unspecified mood [affective] disorder: Secondary | ICD-10-CM

## 2021-04-04 DIAGNOSIS — R5381 Other malaise: Secondary | ICD-10-CM | POA: Diagnosis not present

## 2021-04-04 DIAGNOSIS — E282 Polycystic ovarian syndrome: Secondary | ICD-10-CM

## 2021-04-04 MED ORDER — IBUPROFEN 800 MG PO TABS: 800.0000 mg | ORAL_TABLET | Freq: Every day | ORAL | 2 refills | Status: AC | PRN

## 2021-04-04 MED ORDER — NP THYROID 60 MG PO TABS
60.0000 mg | ORAL_TABLET | Freq: Two times a day (BID) | ORAL | 3 refills | Status: DC
Start: 2021-04-04 — End: 2022-05-07

## 2021-04-04 NOTE — Progress Notes (Signed)
Metrics: Intervention Frequency ACO  Documented Smoking Status Yearly  Screened one or more times in 24 months  Cessation Counseling or  Active cessation medication Past 24 months  Past 24 months   Guideline developer: UpToDate (See UpToDate for funding source) Date Released: 2014       Wellness Office Visit  Subjective:  Patient ID: Donna Kim, female    DOB: [birth year]  Age: 40 y.o. MRN: [id number]  CC: This lady comes in for follow-up of PCOS, thyroid hypofunction, vitamin D deficiency. HPI  I will increase the dose of NP thyroid 90 mg daily which apparently has made her feel somewhat worse.  She felt better when she was on the initial dose of NP thyroid 60 mg daily. She has been doing intermittent fasting and trying to eat healthier along with walking more.  She has lost weight. She has been taking vitamin D3 10,000 units daily for vitamin D deficiency. Past Medical History:  Diagnosis Date  . ADHD   . Back pain 11/12/2013  . Cholestasis 08/2017  . Contraceptive management 08/12/2014  . Headache   . Mental disorder    anxiety  . No pertinent past medical history   . Postpartum bleeding 07/15/2014  . Rosacea   . UTI (urinary tract infection)   . Vaginal Pap smear, abnormal    Past Surgical History:  Procedure Laterality Date  . WISDOM TOOTH EXTRACTION       Family History  Adopted: Yes  Problem Relation Age of Onset  . 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   Social History   Tobacco Use  . Smoking status: Never Smoker  . Smokeless tobacco: Never Used  Substance Use Topics  . Alcohol use: No    Current Meds  Medication Sig  . calcium-vitamin D (OSCAL WITH D) 500-200 MG-UNIT tablet Take 1 tablet by mouth.  . Cholecalciferol (VITAMIN D-3) 125 MCG (5000 UT) TABS Take 2 tablets by mouth daily at 12 noon.  . Coenzyme Q10 (COQ10 PO) Take by  mouth.  Marland Kitchen ibuprofen (ADVIL) 800 MG tablet Take 800 mg by mouth daily as needed for mild pain or moderate pain.   . Multiple Vitamin (MULTIVITAMIN) tablet Take 2 tablets by mouth in the morning and at bedtime.   . NP THYROID 60 MG tablet Take 1 tablet (60 mg total) by mouth 2 (two) times daily.  Marland Kitchen OVER THE COUNTER MEDICATION Vit d 2 Vit e  elderberry  . progesterone (PROMETRIUM) 200 MG capsule Take 1 capsule (200 mg total) by mouth daily.  . SUMAtriptan (IMITREX) 100 MG tablet Take 1 tablet earliest onset of migraine.  May repeat in 2 hours if headache persists or recurs.  Maximum 2 tablets in 24 hours.  . [DISCONTINUED] NP THYROID 90 MG tablet Take 1 tablet (90 mg total) by mouth daily.     Flowsheet Row Initial Prenatal from 03/17/2019 in [facility]  PHQ-9 Total Score 1      Objective:   Today's Vitals: BP 122/80   Pulse 94   Temp 97.7 F (36.5 C) (Temporal)   Ht 5\' 5"  (1.651 m)   Wt 226 lb 12.8 oz (102.9 kg)   SpO2 97%   BMI 37.74 kg/m  Vitals with BMI 04/04/2021 02/26/2021 02/21/2021  Height 5\' 5"  5\' 5"  5\' 5"   Weight 226 lbs 13 oz 231 lbs 231  lbs  BMI 37.74 65.99 35.70  Systolic 177 939 030  Diastolic 80 71 70  Pulse 94 95 88  Some encounter information is confidential and restricted. Go to Review Flowsheets activity to see all data.     Physical Exam  She looks systemically well, remains obese but has lost 5 pounds in weight since last visit.  Blood pressure acceptable.     Assessment   1. Cold intolerance   2. Malaise and fatigue   3. Dyslipidemia   4. PCOS (polycystic ovarian syndrome)   5. Vitamin D deficiency disease       Tests ordered No orders of the defined types were placed in this encounter.    Plan: 1. I think we can further optimize thyroid so we are going to change the dose to NP thyroid 60 mg twice a day now and I have sent a new prescription to reflect this change. 2. Continue with vitamin D3 10,000 units daily. 3. Continue to  focus on nutrition and exercise as before. 4. Follow-up in about 4 to 6 weeks time and most likely we may do blood work then.   Meds ordered this encounter  Medications  . NP THYROID 60 MG tablet    Sig: Take 1 tablet (60 mg total) by mouth 2 (two) times daily.    Dispense:  60 tablet    Refill:  3    Laterra Lubinski Luther Parody, MD

## 2021-04-12 ENCOUNTER — Ambulatory Visit (INDEPENDENT_AMBULATORY_CARE_PROVIDER_SITE_OTHER): Payer: Medicaid Other | Admitting: Internal Medicine

## 2021-05-17 ENCOUNTER — Ambulatory Visit (INDEPENDENT_AMBULATORY_CARE_PROVIDER_SITE_OTHER): Payer: Medicaid Other | Admitting: Internal Medicine

## 2021-06-05 NOTE — Progress Notes (Signed)
Virtual Visit via Video Note The purpose of this virtual visit is to provide medical care while limiting exposure to the novel coronavirus.    Consent was obtained for video visit:  Yes.   Answered questions that patient had about telehealth interaction:  Yes.   I discussed the limitations, risks, security and privacy concerns of performing an evaluation and management service by telemedicine. I also discussed with the patient that there may be a patient responsible charge related to this service. The patient expressed understanding and agreed to proceed.  Pt location: Home Physician Location: office Name of referring provider:  Erven Colla, DO I connected with Donna Kim at patients initiation/request on 06/07/2021 at 10:50 AM EDT by video enabled telemedicine application and verified that I am speaking with the correct person using two identifiers. Pt MRN:  [id number] Pt DOB:  11-08-1981 Video Participants:  Donna Kim  Assessment and Plan:    Cognitive changes - unclear etiology if migraine aura related to menstruation and emotional stress Migraine with aura, without status migrainosus, not intractable, menstrual-related   For preventative management, Mg, B2, CoQ10  For abortive therapy, sumatriptan 100mg   Limit use of pain relievers to no more than 2 days out of week to prevent risk of rebound or medication-overuse headache.  Keep headache diary  Exercise, hydration, caffeine cessation, sleep hygiene, monitor for and avoid triggers  Consider:  magnesium citrate 400mg  daily, riboflavin 400mg  daily, and coenzyme Q10 100mg  three times daily Always keep in mind that currently taking a hormone or birth control may be a possible trigger or aggravating factor for migraine. Follow up 9 months   History of Present Illness:  Donna Kim is a 40 year old female who follows up for migraines.   UPDATE: Underwent workup for recurrent transient cognitive changes.  Routine  awake and asleep EEG on 12/25/2020 was normal.  MRI of brain w/wo contrast on 12/28/2020 personally reviewed showed mild scattered T2/FLAIR hyperintense foci within the bilateral cerebral hemispheres, stable compared to imaging from August 2020 but no acute abnormalities.  Episodes are improved, 1 to 2 days prior to menstruation but primarily triggered by emotional stress.  Migraines are mild, lasting a couple of hours.  Less than 5 since last visit. She was diagnosed with hypothyroidism and currently being treated.   Intensity:  severe Duration:  About 2 hours with sumatriptan Frequency:  No migraines in past 30 days   Current NSAIDS:  ibuprofen Current analgesics:  acetaminophen Current triptans:  sumatriptan 100mg  Current ergotamine:  none Current anti-emetic:  none Current muscle relaxants:  none Current anti-anxiolytic:  none Current sleep aide:  none Current Antihypertensive medications:  none Current Antidepressant medications:  none Current Anticonvulsant medications:  none Current anti-CGRP:  none Current Vitamins/Herbal/Supplements:  magnesium, coenzyme Q10, riboflavin Current Antihistamines/Decongestants:  none Other therapy:  none Hormone/birth control:  none Other medications:  none     HISTORY:  She started having migraines with headache, photophobia and phonophobia in 2010 but responded to chiropractic therapy.  Once in a while, she would have an ocular migraine consisting of scintillating fortification described as jagged geometric shapes of pulsating color in the center of her vision that would slowly move out of of view, lasting 30 minutes.  Sometimes a headache may follow but not always.   In August 2020, during her third trimester, she began having frequent episodes of ocular migraine.  She would also have other visual aura described as fireworks.  She developed trouble talking, felt off-balance  and dizziness.  MRI and MRV of the head without contrast on 07/27/2019 was  personally reviewed and showed nonspecific scattered punctate subcortical hyperintensities but no acute intracranial abnormality and MRV was negative for dural sinus thrombosis.  Symptoms calmed down and she gave birth to her son on October 18.  She had her copper IUD placed in late November.  In December, she began experiencing daily to near daily headaches.  They have since reduced in frequency, occurring 1 to 2 times a week and lasting 2 to 3 hours.  They are moderate to severe right sided headaches, either can be pounding, pressure, sharp/stabbing pain with right eye lacrimation, photophobia, phonophobia but not much nausea.  Question whether they are hormonal as they were aggravated following IUD.  Change in barometric pressure also a trigger.  Fioricet is helpful.     She had COVID in January-February 2022.  During that time, she had brain fog.  She had her copper IUD removed in May.  Since then, she would have 2 days of brain fog, similar to when she had COVID, about a couple of days prior to onset of her period.  She cannot think things or follow conversations well.  Usually not associated with headache.  She typically doesn't drive much anyway so unsure how it would affect her driving.  However, she can still perform daily tasks such as fixing dinner.  She also has been more irritable but denies any noticeable depression.  Over the past year, she has improved her diet and lost weight.  However, she gained some weight over the holidays, which she attributes to the reason for her recent migraine.       Past NSAIDS:  none Past analgesics:  Fioricet Past abortive triptans:  none Past abortive ergotamine:  none Past muscle relaxants:  none Past anti-emetic:  Zofran ODT 8mg ; Promethazine 25mg  Past antihypertensive medications:  amlodipine Past antidepressant medications:  Sertraline 50mg , nortriptyline 10mg  (noise sensitivity) Past anticonvulsant medications:  zonisamide 100mg  Past anti-CGRP:   none Past vitamins/Herbal/Supplements:  riboflavin Past antihistamines/decongestants:  meclizine Other past therapies:  none     Family history of headache:  Unknown (adopted)  Past Medical History: Past Medical History:  Diagnosis Date   ADHD    Back pain 11/12/2013   Cholestasis 08/2017   Contraceptive management 08/12/2014   Headache    Mental disorder    anxiety   No pertinent past medical history    Postpartum bleeding 07/15/2014   Rosacea    UTI (urinary tract infection)    Vaginal Pap smear, abnormal     Medications: Outpatient Encounter Medications as of 06/07/2021  Medication Sig   calcium-vitamin D (OSCAL WITH D) 500-200 MG-UNIT tablet Take 1 tablet by mouth.   Cholecalciferol (VITAMIN D-3) 125 MCG (5000 UT) TABS Take 2 tablets by mouth daily at 12 noon.   Coenzyme Q10 (COQ10 PO) Take by mouth.   ibuprofen (ADVIL) 800 MG tablet Take 1 tablet (800 mg total) by mouth daily as needed for mild pain or moderate pain.   Multiple Vitamin (MULTIVITAMIN) tablet Take 2 tablets by mouth in the morning and at bedtime.    NP THYROID 60 MG tablet Take 1 tablet (60 mg total) by mouth 2 (two) times daily.   OVER THE COUNTER MEDICATION Vit d 2 Vit e  elderberry   progesterone (PROMETRIUM) 200 MG capsule Take 1 capsule (200 mg total) by mouth daily.   SUMAtriptan (IMITREX) 100 MG tablet Take 1 tablet earliest onset of  migraine.  May repeat in 2 hours if headache persists or recurs.  Maximum 2 tablets in 24 hours.   No facility-administered encounter medications on file as of 06/07/2021.    Allergies: Allergies  Allergen Reactions   Cefzil [Cefprozil] Itching and Nausea And Vomiting    Family History: Family History  Adopted: Yes  Problem Relation Age of Onset   ADD / ADHD Daughter     Observations/Objective:   No acute distress.  Alert and oriented.  Speech fluent and not dysarthric.  Language intact.     Follow Up Instructions:    -I discussed the assessment and  treatment plan with the patient. The patient was provided an opportunity to ask questions and all were answered. The patient agreed with the plan and demonstrated an understanding of the instructions.   The patient was advised to call back or seek an in-person evaluation if the symptoms worsen or if the condition fails to improve as anticipated.  Dudley Major, DO

## 2021-06-07 ENCOUNTER — Other Ambulatory Visit: Payer: Self-pay

## 2021-06-07 ENCOUNTER — Telehealth (INDEPENDENT_AMBULATORY_CARE_PROVIDER_SITE_OTHER): Payer: Medicaid Other | Admitting: Neurology

## 2021-06-07 ENCOUNTER — Encounter: Payer: Self-pay | Admitting: Neurology

## 2021-06-07 DIAGNOSIS — G43109 Migraine with aura, not intractable, without status migrainosus: Secondary | ICD-10-CM | POA: Diagnosis not present

## 2021-06-07 MED ORDER — SUMATRIPTAN SUCCINATE 100 MG PO TABS: ORAL_TABLET | ORAL | 5 refills | Status: DC

## 2021-06-19 ENCOUNTER — Other Ambulatory Visit: Payer: Self-pay

## 2021-06-19 ENCOUNTER — Ambulatory Visit (INDEPENDENT_AMBULATORY_CARE_PROVIDER_SITE_OTHER): Payer: Medicaid Other | Admitting: Internal Medicine

## 2021-06-19 ENCOUNTER — Encounter (INDEPENDENT_AMBULATORY_CARE_PROVIDER_SITE_OTHER): Payer: Self-pay | Admitting: Internal Medicine

## 2021-06-19 VITALS — BP 116/72 | HR 90 | Temp 97.9°F | Ht 65.0 in | Wt 228.4 lb

## 2021-06-19 DIAGNOSIS — R5383 Other fatigue: Secondary | ICD-10-CM

## 2021-06-19 DIAGNOSIS — R5381 Other malaise: Secondary | ICD-10-CM | POA: Diagnosis not present

## 2021-06-19 DIAGNOSIS — E559 Vitamin D deficiency, unspecified: Secondary | ICD-10-CM

## 2021-06-19 DIAGNOSIS — E282 Polycystic ovarian syndrome: Secondary | ICD-10-CM | POA: Diagnosis not present

## 2021-06-19 DIAGNOSIS — R6889 Other general symptoms and signs: Secondary | ICD-10-CM

## 2021-06-19 LAB — COMPLETE METABOLIC PANEL WITH GFR
AG Ratio: 1.3 (calc) (ref 1.0–2.5)
ALT: 18 U/L (ref 6–29)
AST: 15 U/L (ref 10–30)
Albumin: 4.1 g/dL (ref 3.6–5.1)
Alkaline phosphatase (APISO): 78 U/L (ref 31–125)
BUN: 12 mg/dL (ref 7–25)
CO2: 26 mmol/L (ref 20–32)
Calcium: 9.4 mg/dL (ref 8.6–10.2)
Chloride: 106 mmol/L (ref 98–110)
Creat: 0.63 mg/dL (ref 0.50–0.97)
Globulin: 3.1 g/dL (calc) (ref 1.9–3.7)
Glucose, Bld: 89 mg/dL (ref 65–139)
Potassium: 3.9 mmol/L (ref 3.5–5.3)
Sodium: 139 mmol/L (ref 135–146)
Total Bilirubin: 0.5 mg/dL (ref 0.2–1.2)
Total Protein: 7.2 g/dL (ref 6.1–8.1)
eGFR: 117 mL/min/{1.73_m2} (ref >=60)

## 2021-06-19 LAB — VITAMIN D 25 HYDROXY (VIT D DEFICIENCY, FRACTURES): Vit D, 25-Hydroxy: 37 ng/mL (ref 30–100)

## 2021-06-19 LAB — TSH: TSH: 0.5 mIU/L

## 2021-06-19 LAB — T3, FREE: T3, Free: 3.7 pg/mL (ref 2.3–4.2)

## 2021-06-19 NOTE — Progress Notes (Signed)
Metrics: Intervention Frequency ACO  Documented Smoking Status Yearly  Screened one or more times in 24 months  Cessation Counseling or  Active cessation medication Past 24 months  Past 24 months   Guideline developer: UpToDate (See UpToDate for funding source) Date Released: 2014       Wellness Office Visit  Subjective:  Patient ID: Donna Kim, adult    DOB: Aug 02, 1981  Age: 40 y.o. MRN: [id number]  CC: This lady comes for follow-up regarding her PCOS, thyroid hypofunction, vitamin D deficiency. HPI  She has been taking vitamin D3 10,000 units daily.  She has been taking NP thyroid 60 mg twice a day although she finds it challenging with the timing in relation to food.  She did not tolerate progesterone and has discontinued it. 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   Social History   Tobacco Use   Smoking status: Never   Smokeless tobacco: Never  Substance Use Topics   Alcohol use: No    Current Meds  Medication Sig   calcium-vitamin D (OSCAL WITH D) 500-200 MG-UNIT tablet Take 1 tablet by mouth.   Cholecalciferol (VITAMIN D-3) 125 MCG (5000 UT) TABS Take 2 tablets by mouth daily at 12 noon.   Coenzyme Q10 (COQ10 PO) Take by mouth.   ibuprofen (ADVIL) 800 MG tablet Take 1 tablet (800 mg total) by mouth daily as needed for mild pain or moderate pain.   Multiple Vitamin (MULTIVITAMIN)  tablet Take 2 tablets by mouth in the morning and at bedtime.    NP THYROID 60 MG tablet Take 1 tablet (60 mg total) by mouth 2 (two) times daily.   OVER THE COUNTER MEDICATION Vit d 2 Vit e  elderberry   SUMAtriptan (IMITREX) 100 MG tablet Take 1 tablet earliest onset of migraine.  May repeat in 2 hours if headache persists or recurs.  Maximum 2 tablets in 24 hours.     Flowsheet Row Initial Prenatal from 03/17/2019 in [facility]  PHQ-9 Total Score 1       Objective:   Today's Vitals: BP 116/72   Pulse 90   Temp 97.9 F (36.6 C) (Temporal)   Ht 5\' 5"  (1.651 m)   Wt 228 lb 6.4 oz (103.6 kg)   SpO2 99%   BMI 38.01 kg/m  Vitals with BMI 06/19/2021 04/04/2021 02/26/2021  Height 5\' 5"  5\' 5"  5\' 5"   Weight 228 lbs 6 oz 226 lbs 13 oz 231 lbs  BMI 38.01 38.18 29.93  Systolic 716 967 893  Diastolic 72 80 71  Pulse 90 94 95  Some encounter information is confidential and restricted. Go to Review Flowsheets activity to see all data.     Physical Exam  She remains obese and has not lost any significant weight.  Assessment   1. PCOS (polycystic ovarian syndrome)   2. Malaise and fatigue   3. Cold intolerance   4. Vitamin D deficiency disease       Tests ordered Orders Placed This Encounter  Procedures   COMPLETE METABOLIC PANEL WITH GFR   VITAMIN D 25 Hydroxy (Vit-D Deficiency, Fractures)   T3, free   TSH      Plan: 1.  Continue with nutrition regarding PCOS with the concept of intermittent fasting and a plant-based diet. 2.  Continue with NP thyroid 60 mg twice a day and we will check levels today. 3.  Continue with vitamin D3 10,000 units daily and we will check levels today. 4.  I will see her in about 3 months time and further recommendations will depend on blood results.    No orders of the defined types were placed in this encounter.   Doree Albee, MD

## 2021-06-22 IMAGING — MR MR MRV HEAD WITHOUT CONTRAST
6 of 8 series · 21 of 48 positions shown · non-contrast
Comparison: None.

CLINICAL DATA: Third trimester pregnancy. New onset headache over
the last 1 week. Personal history of ocular migraines. Photophobia.
Speech changes.

EXAM:
MRI HEAD WITHOUT CONTRAST
MRV HEAD WITHOUT CONTRAST
TECHNIQUE: Multiplanar, multiecho pulse sequences of the brain and surrounding
structures were obtained without intravenous contrast. Angiographic
images of the head were obtained using MRV technique without
contrast.

[Series 8: T2 · axial · 5.0mm · 0.47mm/px · z∈[-121,+24]mm · 2 of 26 slices shown (1 of 2)]
[im 1/26]
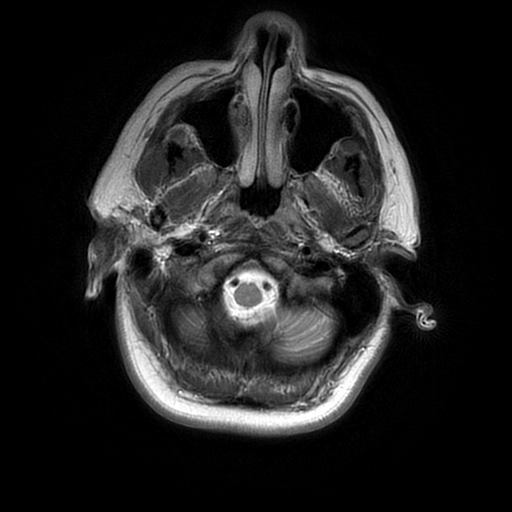
[im 26/26]
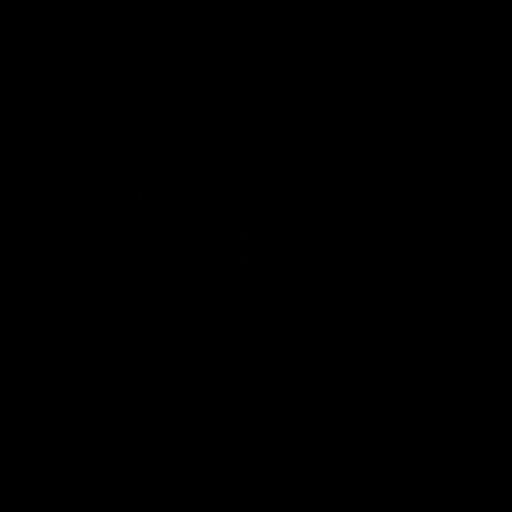

[Series 9: FLAIR · axial · 3.0mm · 0.45mm/px · z∈[-120,+25]mm · 2 of 26 slices shown]
[im 1/26]
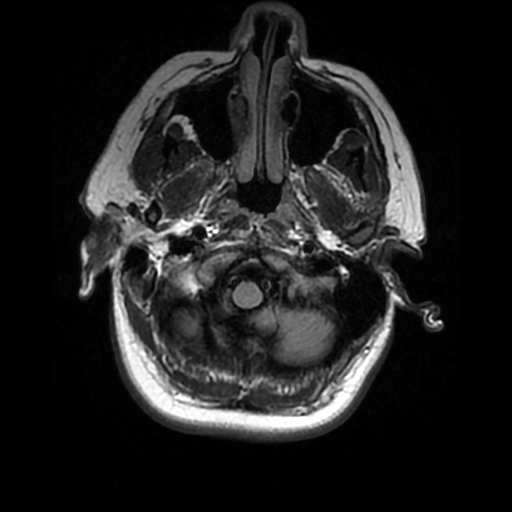
[im 26/26]
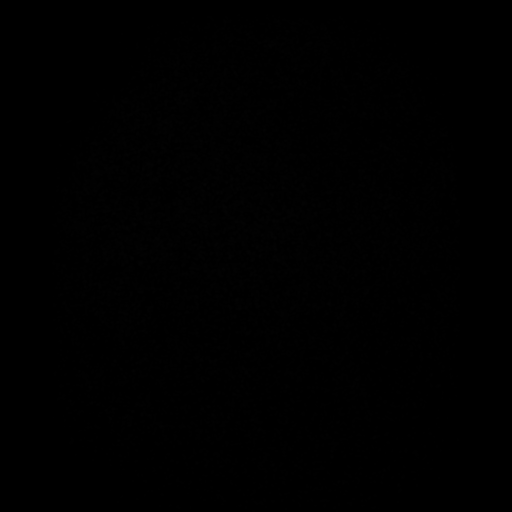

[Series 10: SWI · axial · 3.0mm · 0.47mm/px · z∈[-118,+19]mm · 6 of 96 slices shown (1 of 2)]
[im 1/96]
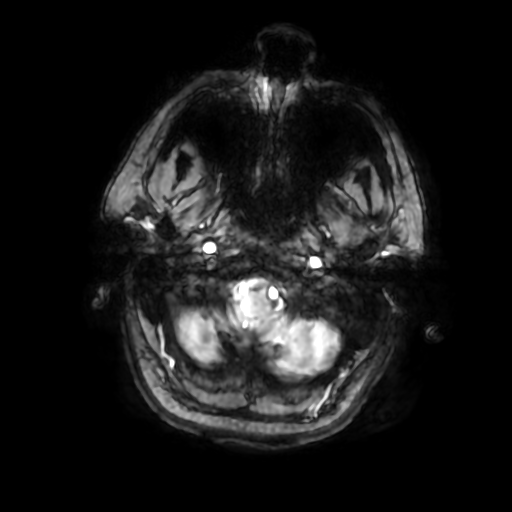
[im 20/96]
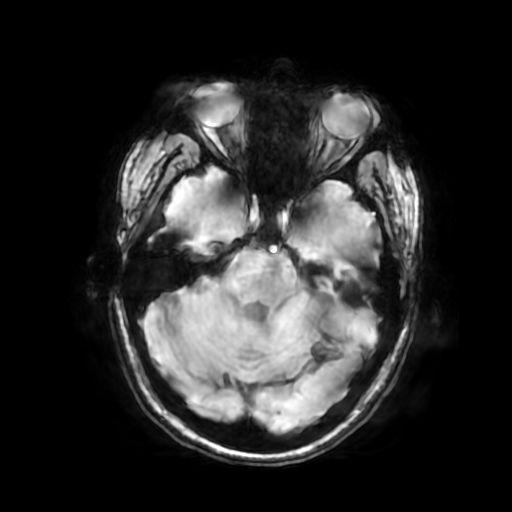
[im 39/96]
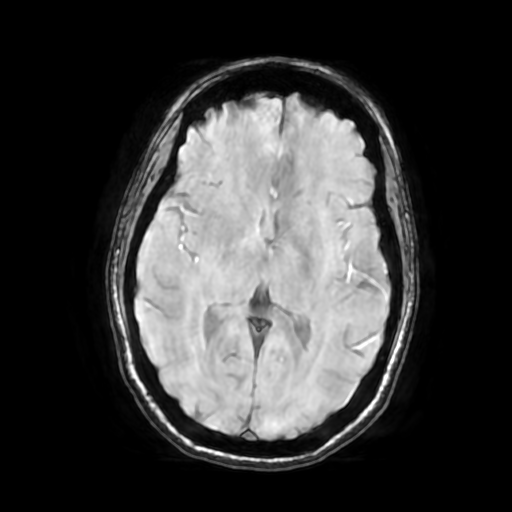
[im 58/96]
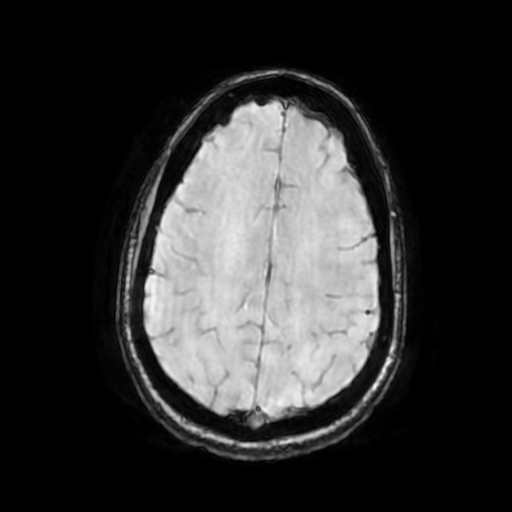
[im 77/96]
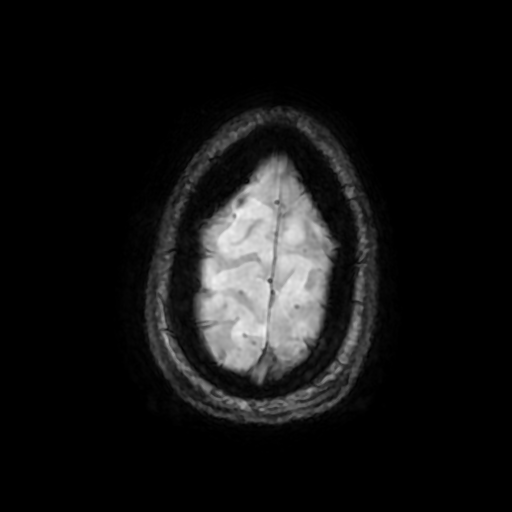
[im 96/96]
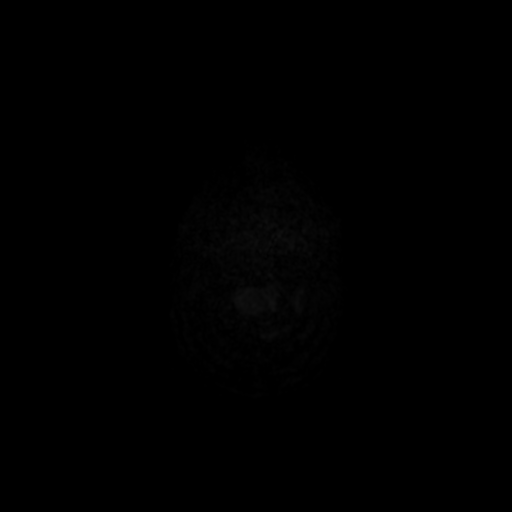

[Series 11: T1 · axial · non-contrast · 3.0mm · 0.94mm/px · z∈[-118,+26]mm · 3 of 50 slices shown]
[im 1/50]
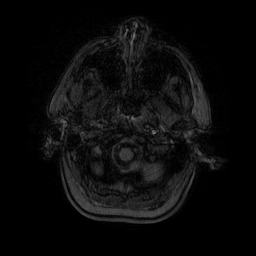
[im 25/50]
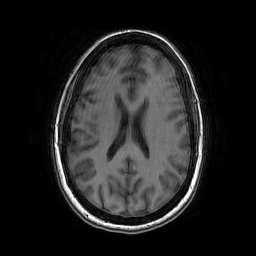
[im 50/50]
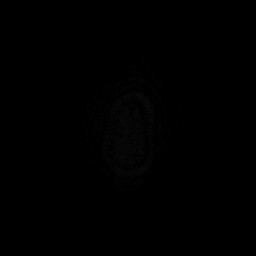

[Series 12: T2 · coronal · 5.0mm · 0.39mm/px · 2 of 28 slices shown (2 of 2)]
[im 1/28]
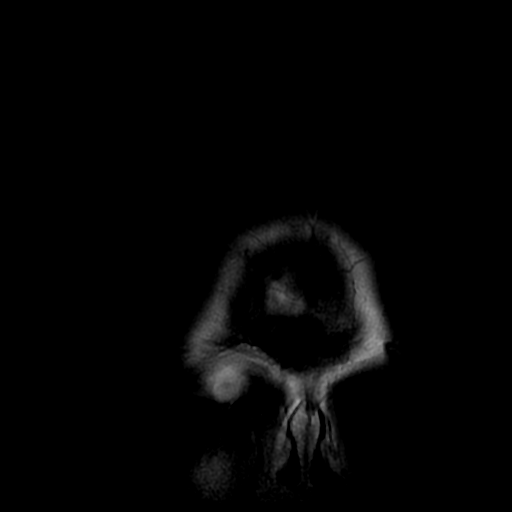
[im 28/28]
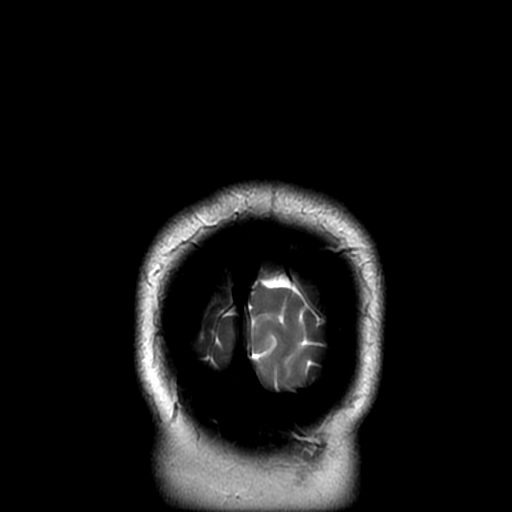

[Series 1000: SWI · axial · 3.0mm · 0.47mm/px · z∈[-118,+19]mm · 6 of 93 slices shown (2 of 2)]
[im 1/93]
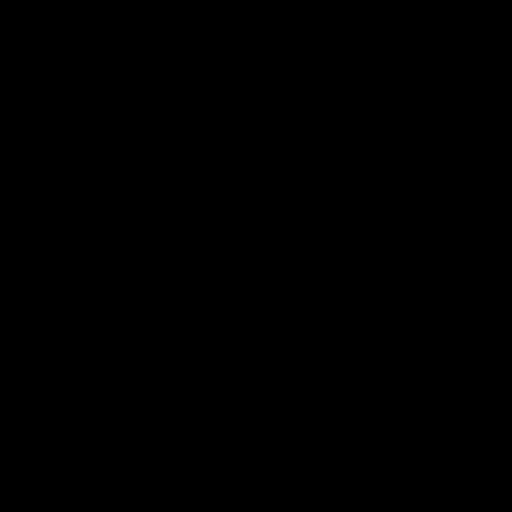
[im 19/93]
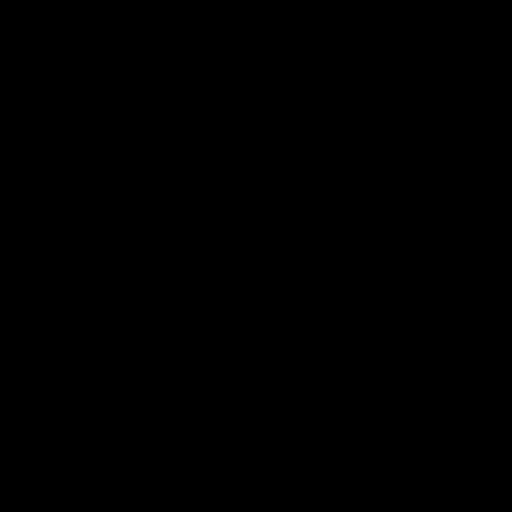
[im 37/93]
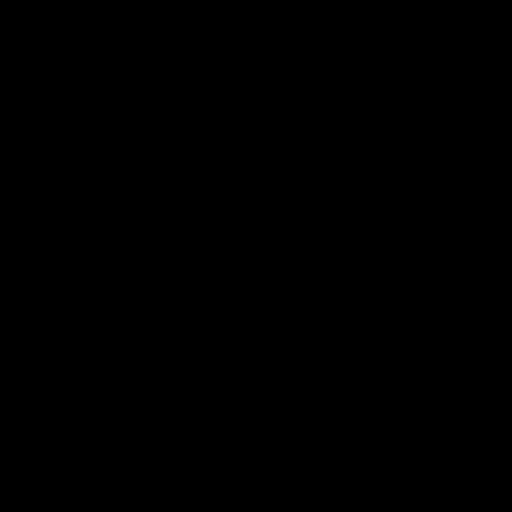
[im 56/93]
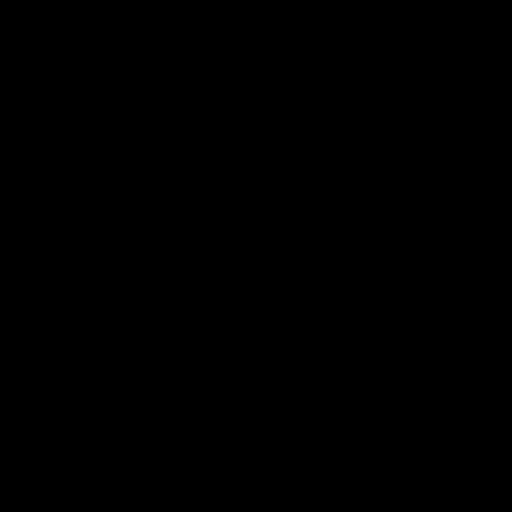
[im 74/93]
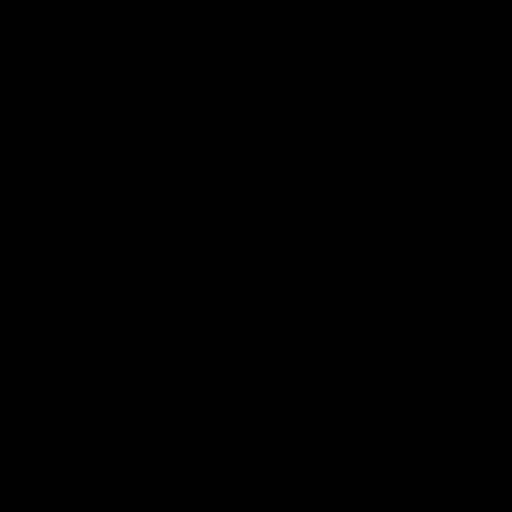
[im 93/93]
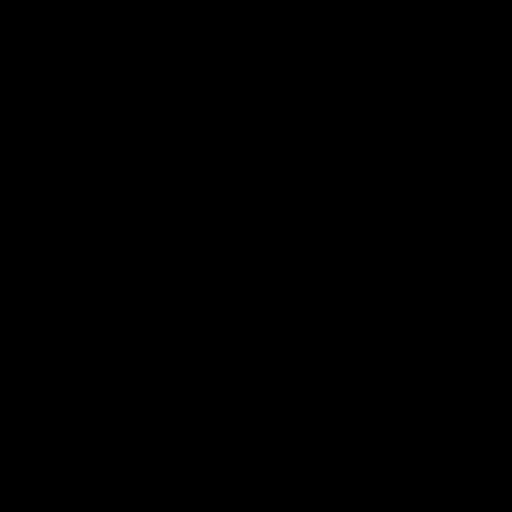

[21 of 48 positions shown; findings below may reference images not displayed]

FINDINGS: MRI HEAD FINDINGS

Brain: No acute infarct, hemorrhage, or mass lesion is present.
Scattered subcortical T2 hyperintensities are mildly advanced for
age.

No other significant white matter changes are present. The
ventricles are of normal size. No significant extraaxial fluid
collection is present.

Pituitary is within normal limits for pregnancy.

The internal auditory canals are within normal limits. The brainstem
and cerebellum are within normal limits.

Vascular: Flow is present in the major intracranial arteries.

Skull and upper cervical spine: The craniocervical junction is
normal. Upper cervical spine is within normal limits. Marrow signal
is unremarkable.

Sinuses/Orbits: The paranasal sinuses and mastoid air cells are
clear. The globes and orbits are within normal limits.

MRV HEAD FINDINGS

Time-of-flight images demonstrate normal flow in the dural sinuses.
Straight sinus and deep cerebral veins are intact. Cortical veins
are unremarkable. Contrast was not utilized secondary to pregnancy.
IMPRESSION: 1. Scattered subcortical T2 hyperintensities are mildly advanced for
age. The finding is nonspecific but can be seen in the setting of
chronic microvascular ischemia, a demyelinating process such as
multiple sclerosis, vasculitis, complicated migraine headaches, or
as the sequelae of a prior infectious or inflammatory process.
2. No acute intracranial abnormality to explain the patient's
headaches or speech changes.
3. Normal MR venogram.

## 2021-07-22 ENCOUNTER — Other Ambulatory Visit: Payer: Self-pay

## 2021-07-22 ENCOUNTER — Encounter: Payer: Self-pay | Admitting: Emergency Medicine

## 2021-07-22 ENCOUNTER — Ambulatory Visit
Admission: EM | Admit: 2021-07-22 | Discharge: 2021-07-22 | Disposition: A | Discharge status: Home / Self Care | Payer: Medicaid Other | Attending: Physician Assistant | Admitting: Physician Assistant

## 2021-07-22 DIAGNOSIS — W540XXA Bitten by dog, initial encounter: Secondary | ICD-10-CM

## 2021-07-22 DIAGNOSIS — S61411A Laceration without foreign body of right hand, initial encounter: Secondary | ICD-10-CM

## 2021-07-22 MED ORDER — AMOXICILLIN-POT CLAVULANATE 875-125 MG PO TABS: 1.0000 | ORAL_TABLET | Freq: Two times a day (BID) | ORAL | 0 refills | Status: DC

## 2021-07-22 NOTE — Discharge Instructions (Addendum)
Take antibiotic as prescribed Keep wounds clean and dry Return for suture removal in 5-7 days.  Return sooner if you develop swelling redness or drainage

## 2021-07-22 NOTE — ED Triage Notes (Signed)
Her dog and neighbor's dog got tangled up with leashes as she was getting dogs untangled, neighbor's dog bit her right hand.  Tetanus shot is up to date.

## 2021-09-13 ENCOUNTER — Ambulatory Visit (INDEPENDENT_AMBULATORY_CARE_PROVIDER_SITE_OTHER): Payer: Medicaid Other | Admitting: Internal Medicine

## 2021-09-18 ENCOUNTER — Ambulatory Visit
Admission: EM | Admit: 2021-09-18 | Discharge: 2021-09-18 | Disposition: A | Discharge status: Home / Self Care | Payer: Medicaid Other | Attending: Family Medicine | Admitting: Family Medicine

## 2021-09-18 ENCOUNTER — Other Ambulatory Visit: Payer: Self-pay

## 2021-09-18 DIAGNOSIS — J069 Acute upper respiratory infection, unspecified: Secondary | ICD-10-CM | POA: Diagnosis not present

## 2021-09-18 MED ORDER — PROMETHAZINE-DM 6.25-15 MG/5ML PO SYRP: 5.0000 mL | ORAL_SOLUTION | Freq: Four times a day (QID) | ORAL | 0 refills | Status: DC | PRN

## 2021-09-18 MED ORDER — PREDNISONE 20 MG PO TABS: 40.0000 mg | ORAL_TABLET | Freq: Every day | ORAL | 0 refills | Status: DC

## 2021-09-18 NOTE — ED Provider Notes (Signed)
[place]   [id number] 09/18/21 Arrival Time: 0807  ASSESSMENT & PLAN:  1. Viral URI with cough    Discussed typical duration of viral illnesses. Reports wheezing at times. OTC symptom care as needed.  Meds ordered this encounter  Medications   predniSONE (DELTASONE) 20 MG tablet    Sig: Take 2 tablets (40 mg total) by mouth daily.    Dispense:  10 tablet    Refill:  0   promethazine-dextromethorphan (PROMETHAZINE-DM) 6.25-15 MG/5ML syrup    Sig: Take 5 mLs by mouth 4 (four) times daily as needed for cough.    Dispense:  118 mL    Refill:  0     Follow-up Information     [facility].   Specialty: Urgent Care Why: As needed. Contact information: [address] [place] [address] [phone]                Reviewed expectations re: course of current medical issues. Questions answered. Outlined signs and symptoms indicating need for more acute intervention. Understanding verbalized. After Visit Summary given.   SUBJECTIVE: History from: patient. Donna Kim is a 40 y.o. adult who reports: cough and nasal cong and sporadic wheezing; x 2-3 d. Child with same. Denies: fever and difficulty breathing. Normal PO intake without n/v/d.  Social History   Tobacco Use  Smoking Status Never  Smokeless Tobacco Never     OBJECTIVE:  Vitals:   09/18/21 0813  BP: 129/82  Pulse: 85  Resp: 14  Temp: 98.6 F (37 C)  TempSrc: Oral  SpO2: 98%    General appearance: alert; no distress Eyes: PERRLA; EOMI; conjunctiva normal HENT: [place]; AT; with nasal congestion Neck: supple  Lungs: speaks full sentences without difficulty; unlabored; no active wheezing Extremities: no edema Skin: warm and dry Neurologic: normal gait Psychological: alert and cooperative; normal mood and affect   Allergies  Allergen Reactions   Cefzil [Cefprozil] Itching and Nausea And Vomiting    Past Medical  History:  Diagnosis Date   ADHD    Back pain 11/12/2013   Cholestasis 08/2017   Contraceptive management 08/12/2014   Headache    Mental disorder    anxiety   No pertinent past medical history    Postpartum bleeding 07/15/2014   Rosacea    UTI (urinary tract infection)    Vaginal Pap smear, abnormal    Social History   Socioeconomic History   Marital status: Married    Spouse name: Not on file   Number of children: 4   Years of education: 16   Highest education level: Not on file  Occupational History   Occupation: stay at home mom  Tobacco Use   Smoking status: Never   Smokeless tobacco: Never  Vaping Use   Vaping Use: Never used  Substance and Sexual Activity   Alcohol use: No   Drug use: No   Sexual activity: Yes    Birth control/protection: None, Condom  Other Topics Concern   Not on file  Social History Narrative   Married 12 years.Lives with husband and kids.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   Social Determinants of Health   Financial Resource Strain: Not on file  Food Insecurity: Not on file  Transportation Needs: Not on file  Physical Activity: Not on file  Stress: Not on file  Social Connections: Not on file  Intimate Partner Violence: Not on file   Family History  Adopted: Yes  Problem Relation  Age of Onset   ADD / ADHD Daughter    Past Surgical History:  Procedure Laterality Date   WISDOM TOOTH EXTRACTION       Vanessa Kick, MD 09/18/21 [phone]

## 2021-09-18 NOTE — ED Triage Notes (Signed)
Pt presents with cough x3d Taking OTC mucinex

## 2021-10-03 ENCOUNTER — Encounter: Payer: Self-pay | Admitting: Emergency Medicine

## 2021-10-03 ENCOUNTER — Ambulatory Visit
Admission: EM | Admit: 2021-10-03 | Discharge: 2021-10-03 | Disposition: A | Discharge status: Home / Self Care | Payer: Medicaid Other | Attending: Urgent Care | Admitting: Urgent Care

## 2021-10-03 ENCOUNTER — Other Ambulatory Visit: Payer: Self-pay

## 2021-10-03 DIAGNOSIS — R053 Chronic cough: Secondary | ICD-10-CM

## 2021-10-03 DIAGNOSIS — J018 Other acute sinusitis: Secondary | ICD-10-CM

## 2021-10-03 DIAGNOSIS — H9203 Otalgia, bilateral: Secondary | ICD-10-CM

## 2021-10-03 MED ORDER — AMOXICILLIN-POT CLAVULANATE 875-125 MG PO TABS: 1.0000 | ORAL_TABLET | Freq: Two times a day (BID) | ORAL | 0 refills | Status: DC

## 2021-10-03 NOTE — ED Provider Notes (Signed)
[place]   MRN: [id number] DOB: 15-Apr-1981  Subjective:   Donna Kim is a 40 y.o. adult presenting for 3-week history of persistent cough, rattling in her chest.  Has started to feel more throat pain bilateral ear fullness and pain worse over the right side, sinus headaches in the past week.  Did a COVID test on 09/17/2021 and was negative.  No shortness of breath, wheezing.  No history of asthma.  No current facility-administered medications for this encounter.  Current Outpatient Medications:    Cholecalciferol (VITAMIN D-3) 125 MCG (5000 UT) TABS, Take 2 tablets by mouth daily at 12 noon., Disp: , Rfl:    Coenzyme Q10 (COQ10 PO), Take by mouth., Disp: , Rfl:    ibuprofen (ADVIL) 800 MG tablet, Take 1 tablet (800 mg total) by mouth daily as needed for mild pain or moderate pain., Disp: 30 tablet, Rfl: 2   Multiple Vitamin (MULTIVITAMIN) tablet, Take 2 tablets by mouth in the morning and at bedtime. , Disp: , Rfl:    NP THYROID 60 MG tablet, Take 1 tablet (60 mg total) by mouth 2 (two) times daily., Disp: 60 tablet, Rfl: 3   OVER THE COUNTER MEDICATION, Vit d 2 Vit e  elderberry, Disp: , Rfl:    predniSONE (DELTASONE) 20 MG tablet, Take 2 tablets (40 mg total) by mouth daily., Disp: 10 tablet, Rfl: 0   promethazine-dextromethorphan (PROMETHAZINE-DM) 6.25-15 MG/5ML syrup, Take 5 mLs by mouth 4 (four) times daily as needed for cough., Disp: 118 mL, Rfl: 0   Allergies  Allergen Reactions   Cefzil [Cefprozil] Itching and Nausea And Vomiting    Past Medical History:  Diagnosis Date   ADHD    Back pain 11/12/2013   Cholestasis 08/2017   Contraceptive management 08/12/2014   Headache    Mental disorder    anxiety   No pertinent past medical history    Postpartum bleeding 07/15/2014   Rosacea    UTI (urinary tract infection)    Vaginal Pap smear, abnormal      Past Surgical History:  Procedure Laterality Date   WISDOM TOOTH EXTRACTION      Family  History  Adopted: Yes  Problem Relation Age of Onset   ADD / ADHD Daughter     Social History   Tobacco Use   Smoking status: Never   Smokeless tobacco: Never  Vaping Use   Vaping Use: Never used  Substance Use Topics   Alcohol use: No   Drug use: No    ROS   Objective:   Vitals: BP 121/82 (BP Location: Right Arm)   Pulse 84   Temp 98.2 F (36.8 C) (Oral)   Resp 18   LMP 09/21/2021 (Exact Date)   SpO2 98%   Physical Exam Constitutional:      General: She is not in acute distress.    Appearance: Normal appearance. She is well-developed. She is not ill-appearing, toxic-appearing or diaphoretic.  HENT:     Head: Normocephalic and atraumatic.     Right Ear: Tympanic membrane, ear canal and external ear normal. There is no impacted cerumen.     Left Ear: Tympanic membrane, ear canal and external ear normal. There is no impacted cerumen.     Nose: Congestion present.     Mouth/Throat:     Mouth: Mucous membranes are moist.     Pharynx: No oropharyngeal exudate or posterior oropharyngeal erythema.     Comments: Postnasal drainage overlying pharynx. Eyes:  General: No scleral icterus.       Right eye: No discharge.        Left eye: No discharge.     Extraocular Movements: Extraocular movements intact.     Conjunctiva/sclera: Conjunctivae normal.     Pupils: Pupils are equal, round, and reactive to light.  Cardiovascular:     Rate and Rhythm: Normal rate and regular rhythm.     Heart sounds: Normal heart sounds. No murmur heard.   No friction rub. No gallop.  Pulmonary:     Effort: Pulmonary effort is normal. No respiratory distress.     Breath sounds: Normal breath sounds. No stridor. No wheezing, rhonchi or rales.  Neurological:     Mental Status: She is alert and oriented to person, place, and time.  Psychiatric:        Mood and Affect: Mood normal.        Behavior: Behavior normal.        Thought Content: Thought content normal.        Judgment: Judgment  normal.    Assessment and Plan :   PDMP not reviewed this encounter.  1. Acute non-recurrent sinusitis of other sinus   2. Acute ear pain, bilateral   3. Persistent cough    Will start empiric treatment for sinusitis with Augmentin.  Patient had previously received a prescription for prednisone 2 to 3 weeks ago when she was last seen.  She did not end up starting it.  Advised that she go ahead and complete the course.  Recommended supportive care otherwise including the use of oral antihistamine. Deferred imaging given clear cardiopulmonary exam, hemodynamically stable vital signs. Counseled patient on potential for adverse effects with medications prescribed/recommended today, ER and return-to-clinic precautions discussed, patient verbalized understanding.     Jaynee Eagles, [place] 10/04/21 [phone]

## 2021-10-03 NOTE — ED Triage Notes (Signed)
Cough, sore throat, bilateral ear pain, head pain x 3 days.  Cough x 3 weeks.  Covid test on 10/17 was negative.

## 2022-02-05 ENCOUNTER — Encounter: Payer: Self-pay | Admitting: Emergency Medicine

## 2022-02-05 ENCOUNTER — Ambulatory Visit
Admission: EM | Admit: 2022-02-05 | Discharge: 2022-02-05 | Disposition: A | Discharge status: Home / Self Care | Payer: Medicaid Other | Attending: Family Medicine | Admitting: Family Medicine

## 2022-02-05 ENCOUNTER — Other Ambulatory Visit: Payer: Self-pay

## 2022-02-05 DIAGNOSIS — J392 Other diseases of pharynx: Secondary | ICD-10-CM

## 2022-02-05 LAB — POCT RAPID STREP A (OFFICE): Rapid Strep A Screen: NEGATIVE

## 2022-02-05 MED ORDER — AZELASTINE HCL 0.1 % NA SOLN: 1.0000 | Freq: Two times a day (BID) | NASAL | 0 refills | Status: DC

## 2022-02-05 MED ORDER — CETIRIZINE HCL 10 MG PO TABS: 10.0000 mg | ORAL_TABLET | Freq: Every day | ORAL | 0 refills | Status: DC

## 2022-02-05 MED ORDER — SUCRALFATE 1 G PO TABS: 1.0000 g | ORAL_TABLET | Freq: Three times a day (TID) | ORAL | 0 refills | Status: DC | PRN

## 2022-02-05 NOTE — ED Triage Notes (Signed)
Pt reports history of silent reflux. Pt reports mild throat pain, intermittent post nasal drip/nasal congestion.  ?

## 2022-02-09 NOTE — ED Provider Notes (Signed)
?[facility] ? ? ? ?CSN: [id number] ?Arrival date & time: 02/05/22  1817 ? ? ?  ? ?History   ?Chief Complaint ?Chief Complaint  ?Patient presents with  ? Sore Throat  ? ? ?HPI ?Donna Kim is a 41 y.o. adult.  ? ?Presenting today with several day history of throat pain, swollen sensation, intermittent postnasal drip and congestion.  Denies fever, chills, cough, chest pain, shortness of breath, dysphagia, difficulty breathing, nausea, vomiting, diarrhea.  Has not been trying anything thus far for symptoms other than throat drops.  Has a history of "silent reflux "not currently taking anything for this and wonders if this is what is causing symptoms.  No known sick contacts recently.  No new medication changes. ? ? ?Past Medical History:  ?Diagnosis Date  ? ADHD   ? Back pain 11/12/2013  ? Cholestasis 08/2017  ? Contraceptive management 08/12/2014  ? Headache   ? Mental disorder   ? anxiety  ? No pertinent past medical history   ? Postpartum bleeding 07/15/2014  ? Rosacea   ? UTI (urinary tract infection)   ? Vaginal Pap smear, abnormal   ? ? ?Patient Active Problem List  ? Diagnosis Date Noted  ? Mood disorder ([place]) 04/04/2021  ? Gastroesophageal reflux disease without esophagitis 09/05/2020  ? Calculus of gallbladder without cholecystitis without obstruction 09/05/2020  ? Encounter for IUD insertion 10/26/2019  ? Migraine with aura 08/19/2019  ? Gestational thrombocytopenia ([place]) 06/22/2019  ? History of postpartum hypertension 10/30/2017  ? History of cholestasis during pregnancy 08/28/2017  ? Depression with anxiety 04/14/2017  ? ADHD (attention deficit hyperactivity disorder), inattentive type 10/02/2014  ? Rosacea, acne 03/18/2014  ? ? ?Past Surgical History:  ?Procedure Laterality Date  ? WISDOM TOOTH EXTRACTION    ? ? ?OB History   ? ? Gravida  ?4  ? Para  ?4  ? Term  ?4  ? Preterm  ?   ? AB  ?   ? Living  ?4  ?  ? ? SAB  ?   ? IAB  ?   ? Ectopic  ?   ? Multiple  ?0  ? Live Births  ?4  ?   ?  ?   ? ? ? ?Home Medications   ? ?Prior to Admission medications   ?Medication Sig Start Date End Date Taking? Authorizing Provider  ?azelastine (ASTELIN) 0.1 % nasal spray Place 1 spray into both nostrils 2 (two) times daily. Use in each nostril as directed 02/05/22  Yes Volney American, PA-C  ?cetirizine (ZYRTEC ALLERGY) 10 MG tablet Take 1 tablet (10 mg total) by mouth daily. 02/05/22  Yes Volney American, PA-C  ?sucralfate (CARAFATE) 1 g tablet Take 1 tablet (1 g total) by mouth 3 (three) times daily as needed. 1 tablet into a glass of water and drink as needed 02/05/22  Yes Volney American, PA-C  ?amoxicillin-clavulanate (AUGMENTIN) 875-125 MG tablet Take 1 tablet by mouth 2 (two) times daily. 10/03/21   Jaynee Eagles, PA-C  ?Cholecalciferol (VITAMIN D-3) 125 MCG (5000 UT) TABS Take 2 tablets by mouth daily at 12 noon.    [provider]  ?Coenzyme Q10 (COQ10 PO) Take by mouth.    [provider]  ?ibuprofen (ADVIL) 800 MG tablet Take 1 tablet (800 mg total) by mouth daily as needed for mild pain or moderate pain. 04/04/21   Doree Albee, MD  ?Multiple Vitamin (MULTIVITAMIN) tablet Take 2 tablets by mouth in the morning  and at bedtime.     [provider]  ?NP THYROID 60 MG tablet Take 1 tablet (60 mg total) by mouth 2 (two) times daily. 04/04/21   Doree Albee, MD  ?OVER THE COUNTER MEDICATION Vit d 2 ?Vit e  ?elderberry    [provider]  ?predniSONE (DELTASONE) 20 MG tablet Take 2 tablets (40 mg total) by mouth daily. 09/18/21   Vanessa Kick, MD  ?promethazine-dextromethorphan (PROMETHAZINE-DM) 6.25-15 MG/5ML syrup Take 5 mLs by mouth 4 (four) times daily as needed for cough. 09/18/21   Vanessa Kick, MD  ? ? ?Family History ?Family History  ?Adopted: Yes  ?Problem Relation Age of Onset  ? ADD / ADHD Daughter   ? ? ?Social History ?Social History  ? ?Tobacco Use  ? Smoking status: Never  ? Smokeless tobacco: Never  ?Vaping Use  ? Vaping Use: Never used   ?Substance Use Topics  ? Alcohol use: No  ? Drug use: No  ? ? ? ?Allergies   ?Cefzil [cefprozil] ? ? ?Review of Systems ?Review of Systems ?Per HPI ? ?Physical Exam ?Triage Vital Signs ?ED Triage Vitals  ?Enc Vitals Group  ?   BP 02/05/22 1846 (!) 152/82  ?   Pulse Rate 02/05/22 1846 81  ?   Resp 02/05/22 1846 18  ?   Temp 02/05/22 1846 98.1 ?F (36.7 ?C)  ?   Temp Source 02/05/22 1846 Oral  ?   SpO2 02/05/22 1846 100 %  ?   Weight 02/05/22 1847 219 lb (99.3 kg)  ?   Height 02/05/22 1847 '5\' 5"'$  (1.651 m)  ?   Head Circumference --   ?   Peak Flow --   ?   Pain Score 02/05/22 1846 6  ?   Pain Loc --   ?   Pain Edu? --   ?   Excl. in [place]? --   ? ?No data found. ? ?Updated Vital Signs ?BP (!) 152/82 (BP Location: Right Arm)   Pulse 81   Temp 98.1 ?F (36.7 ?C) (Oral)   Resp 18   Ht '5\' 5"'$  (1.651 m)   Wt 219 lb (99.3 kg)   LMP 02/03/2022 (Approximate)   SpO2 100%   BMI 36.44 kg/m?  ? ?Visual Acuity ?Right Eye Distance:   ?Left Eye Distance:   ?Bilateral Distance:   ? ?Right Eye Near:   ?Left Eye Near:    ?Bilateral Near:    ? ?Physical Exam ?Vitals and nursing note reviewed.  ?Constitutional:   ?   Appearance: She is well-developed.  ?HENT:  ?   Head: Atraumatic.  ?   Right Ear: External ear normal.  ?   Left Ear: External ear normal.  ?   Nose: Rhinorrhea present.  ?   Comments: Nasal mucosa erythematous and edematous bilaterally ?   Mouth/Throat:  ?   Mouth: Mucous membranes are moist.  ?   Pharynx: Posterior oropharyngeal erythema present. No oropharyngeal exudate.  ?   Comments: No tonsillar edema, mild posterior oropharyngeal erythema.  Uvula midline, oral airway patent ?Eyes:  ?   Conjunctiva/sclera: Conjunctivae normal.  ?   Pupils: Pupils are equal, round, and reactive to light.  ?Neck:  ?   Comments: No thyromegaly palpable ?Cardiovascular:  ?   Rate and Rhythm: Normal rate and regular rhythm.  ?Pulmonary:  ?   Effort: Pulmonary effort is normal. No respiratory distress.  ?   Breath sounds: No wheezing or  rales.  ?Musculoskeletal:     ?  General: Normal range of motion.  ?   Cervical back: Normal range of motion and neck supple. No tenderness.  ?Lymphadenopathy:  ?   Cervical: No cervical adenopathy.  ?Skin: ?   General: Skin is warm and dry.  ?Neurological:  ?   Mental Status: She is alert and oriented to person, place, and time.  ?Psychiatric:     ?   Behavior: Behavior normal.  ? ?UC Treatments / Results  ?Labs ?(all labs ordered are listed, but only abnormal results are displayed) ?Labs Reviewed  ?POCT RAPID STREP A (OFFICE)  ? ? ?EKG ? ? ?Radiology ?No results found. ? ?Procedures ?Procedures (including critical care time) ? ?Medications Ordered in UC ?Medications - No data to display ? ?Initial Impression / Assessment and Plan / UC Course  ?I have reviewed the triage vital signs and the nursing notes. ? ?Pertinent labs & imaging results that were available during my care of the patient were reviewed by me and considered in my medical decision making (see chart for details). ? ?  ? ?Rapid strep negative, vital signs and exam overall very reassuring.  Possibly postnasal drip causing throat irritation but will also cover for reflux causes with Carafate as needed, omeprazole.  Astelin, Zyrtec sent for postnasal drip.  Close follow-up recommended with PCP for recheck and return at anytime if worsening symptoms. ? ?Final Clinical Impressions(s) / UC Diagnoses  ? ?Final diagnoses:  ?Throat irritation  ? ?Discharge Instructions   ?None ?  ? ?ED Prescriptions   ? ? Medication Sig Dispense Auth. Provider  ? sucralfate (CARAFATE) 1 g tablet Take 1 tablet (1 g total) by mouth 3 (three) times daily as needed. 1 tablet into a glass of water and drink as needed 30 tablet Volney American, PA-C  ? azelastine (ASTELIN) 0.1 % nasal spray Place 1 spray into both nostrils 2 (two) times daily. Use in each nostril as directed 30 mL Volney American, PA-C  ? cetirizine (ZYRTEC ALLERGY) 10 MG tablet Take 1 tablet (10 mg  total) by mouth daily. 30 tablet Volney American, [place]  ? ?  ? ?PDMP not reviewed this encounter. ?  ?Volney American, PA-C ?02/09/22 0840 ? ?

## 2022-03-18 ENCOUNTER — Ambulatory Visit: Payer: Medicaid Other | Admitting: Family Medicine

## 2022-03-18 ENCOUNTER — Encounter: Payer: Self-pay | Admitting: Family Medicine

## 2022-03-18 VITALS — BP 122/82 | HR 95 | Ht 66.0 in | Wt 224.6 lb

## 2022-03-18 DIAGNOSIS — Z1159 Encounter for screening for other viral diseases: Secondary | ICD-10-CM

## 2022-03-18 DIAGNOSIS — F900 Attention-deficit hyperactivity disorder, predominantly inattentive type: Secondary | ICD-10-CM | POA: Diagnosis not present

## 2022-03-18 DIAGNOSIS — G43109 Migraine with aura, not intractable, without status migrainosus: Secondary | ICD-10-CM | POA: Diagnosis not present

## 2022-03-18 DIAGNOSIS — N926 Irregular menstruation, unspecified: Secondary | ICD-10-CM | POA: Diagnosis not present

## 2022-03-18 DIAGNOSIS — E559 Vitamin D deficiency, unspecified: Secondary | ICD-10-CM | POA: Diagnosis not present

## 2022-03-18 DIAGNOSIS — F902 Attention-deficit hyperactivity disorder, combined type: Secondary | ICD-10-CM

## 2022-03-18 DIAGNOSIS — R21 Rash and other nonspecific skin eruption: Secondary | ICD-10-CM | POA: Diagnosis not present

## 2022-03-18 DIAGNOSIS — L209 Atopic dermatitis, unspecified: Secondary | ICD-10-CM | POA: Diagnosis not present

## 2022-03-18 LAB — POCT URINE PREGNANCY: Preg Test, Ur: NEGATIVE

## 2022-03-18 MED ORDER — GUANFACINE HCL ER 1 MG PO TB24: 1.0000 mg | ORAL_TABLET | Freq: Every day | ORAL | 0 refills | Status: DC

## 2022-03-18 MED ORDER — TRIAMCINOLONE ACETONIDE 0.1 % EX CREA: 1.0000 "application " | TOPICAL_CREAM | Freq: Two times a day (BID) | CUTANEOUS | 0 refills | Status: DC

## 2022-03-18 NOTE — Assessment & Plan Note (Signed)
-  Has not had any recent episodes ?-Pt states that she takes magnesium OTC ?

## 2022-03-18 NOTE — Assessment & Plan Note (Signed)
-  likely atopic dermatitis ?-Kenalog cream orderd ? ?

## 2022-03-18 NOTE — Assessment & Plan Note (Signed)
-   Pt was on Vyvanse before pregnancy but stated that she doesn't want to be on stimulant ?- Pt requested Guanfacine instead and stated that she daughter was stable on this mediation ?-Guanfacine order and given a 30 days supply.  ? ?

## 2022-03-18 NOTE — Progress Notes (Addendum)
? ?New Patient Office Visit ? ?Subjective:  ?Patient ID: Donna Kim, adult    DOB: [birth year]  Age: 41 y.o. MRN: [id number] ? ?CC:  ?Chief Complaint  ?Patient presents with  ? New Patient (Initial Visit)  ?  Pt would like to discuss umbilical hernia from pregnancy. Has concerns about racing mind.   ? Rash  ?  Onset was last week on Monday, unsure what this could be related to, rash is on breast and chest and arms.   ? ? ?HPI ?Donna Kim is a 41 y.o. female with a past medical history of migraine with aura, ADHD, depression, and anxiety, GERD presents for establishing care. ?-C/o of an itchy red rash on her breast, arms, and lower abdomen ?-Denies burning, pain, and fever with the rash ?-Denies changes in ADLS ?-Onset of the rash was a week ?-Has a history of hay fever and ADHD ?-Requested Guanfacine for ADHD ? ?  ? ?Past Medical History:  ?Diagnosis Date  ? ADHD   ? Back pain 11/12/2013  ? Cholestasis 08/2017  ? Contraceptive management 08/12/2014  ? Headache   ? Mental disorder   ? anxiety  ? No pertinent past medical history   ? Postpartum bleeding 07/15/2014  ? Rosacea   ? UTI (urinary tract infection)   ? Vaginal Pap smear, abnormal   ? ? ?Past Surgical History:  ?Procedure Laterality Date  ? WISDOM TOOTH EXTRACTION    ? ? ?Family History  ?Adopted: Yes  ?Problem Relation Age of Onset  ? ADD / ADHD Daughter   ? ? ?Social History  ? ?Socioeconomic History  ? Marital status: Married  ?  Spouse name: Not on file  ? Number of children: 4  ? Years of education: 64  ? Highest education level: Not on file  ?Occupational History  ? Occupation: stay at home mom  ?Tobacco Use  ? Smoking status: Never  ? Smokeless tobacco: Never  ?Vaping Use  ? Vaping Use: Never used  ?Substance and Sexual Activity  ? Alcohol use: No  ? Drug use: No  ? Sexual activity: Yes  ?  Birth control/protection: None, Condom  ?Other Topics Concern  ? Not on file  ?Social History Narrative  ? Married 12 years.Lives with husband and kids.Stay at  [facility] the kids.  ? ?Social Determinants of Health  ? ?Financial Resource Strain: Not on file  ?Food Insecurity: Not on file  ?Transportation Needs: Not on file  ?Physical Activity: Not on file  ?Stress: Not on file  ?Social Connections: Not on file  ?Intimate Partner Violence: Not on file  ? ? ?ROS ?Review of Systems  ?Constitutional:  Positive for fatigue. Negative for chills and fever.  ?HENT:  Positive for sneezing. Negative for rhinorrhea, sinus pressure and sinus pain.   ?Eyes:  Positive for itching. Negative for photophobia, redness and visual disturbance.  ?Respiratory:  Negative for cough, chest tightness and shortness of breath.   ?Cardiovascular:  Negative for chest pain.  ?Gastrointestinal:  Negative for constipation, diarrhea, nausea and vomiting.  ?Endocrine: Negative for cold intolerance, heat intolerance, polydipsia, polyphagia and polyuria.  ?Genitourinary:  Negative for frequency and urgency.  ?Musculoskeletal:  Positive for back pain.  ?Skin:  Positive for rash.  ?Allergic/Immunologic: Positive for environmental allergies.  ?Neurological:  Negative for dizziness, light-headedness and headaches.  ?Psychiatric/Behavioral:  Negative for confusion, self-injury and suicidal ideas.   ? ?Objective:  ? ?Today's Vitals: BP 122/82 (BP Location: Right Arm, Patient Position: Sitting)   Pulse  95   Ht _0  (1.676 m)   Wt 224 lb 9.6 oz (101.9 kg)   SpO2 99%   BMI 36.25 kg/m?  ? ?Physical Exam ?Constitutional:   ?   Appearance: Normal appearance.  ?HENT:  ?   Head: Normocephalic.  ?   Right Ear: External ear normal.  ?   Left Ear: External ear normal.  ?   Mouth/Throat:  ?   Mouth: Mucous membranes are moist.  ?Eyes:  ?   Extraocular Movements: Extraocular movements intact.  ?   Pupils: Pupils are equal, round, and reactive to light.  ?Cardiovascular:  ?   Rate and Rhythm: Normal rate and regular rhythm.  ?   Pulses: Normal pulses.  ?   Heart sounds: Normal heart sounds.  ?Pulmonary:  ?   Effort:  Pulmonary effort is normal.  ?   Breath sounds: Normal breath sounds.  ?Abdominal:  ?   General: Bowel sounds are normal.  ?   Palpations: Abdomen is soft.  ?   Hernia: A hernia is present.  ?   Comments: Umbilical hernia noted with no pain or discomfort  ?Musculoskeletal:  ?   Cervical back: Normal range of motion.  ?   Right lower leg: No edema.  ?   Left lower leg: No edema.  ?Skin: ?   General: Skin is warm.  ?   Capillary Refill: Capillary refill takes less than 2 seconds.  ?   Findings: Rash present. Rash is papular.  ?   Comments: Red, itchy papular rash noted on the arms, lower left abdomen, and upper inner breast  ?Neurological:  ?   Mental Status: She is alert and oriented to person, place, and time.  ?Psychiatric:  ?   Comments: Normal affect  ? ? ?Assessment & Plan:  ? ?Problem List Items Addressed This Visit   ? ?  ? Cardiovascular and Mediastinum  ? Migraine with aura  ?  -Has not had any recent episodes ?-Pt states that she takes magnesium OTC ? ?  ?  ? Relevant Orders  ? CBC with Differential/Platelet  ? CMP14+EGFR  ? Lipid Profile  ? TSH + free T4  ? Hemoglobin A1c  ?  ? Musculoskeletal and Integument  ? Rash  ?  -likely atopic dermatitis ?-Kenalog cream orderd ? ? ?  ?  ?  ? Other  ? ADHD (attention deficit hyperactivity disorder), inattentive type  ?  - Pt was on Vyvanse before pregnancy but stated that she doesn't want to be on stimulant ?- Pt requested Guanfacine instead and stated that she daughter was stable on this mediation ?-Guanfacine order and given a 30 days supply.  ? ? ?  ?  ? ?Other Visit Diagnoses   ? ? Attention deficit hyperactivity disorder (ADHD), combined type    -  Primary  ? Relevant Medications  ? guanFACINE (INTUNIV) 1 MG TB24 ER tablet  ? Atopic dermatitis, unspecified type      ? Relevant Medications  ? triamcinolone cream (KENALOG) 0.1 %  ? Need for hepatitis C screening test      ? Relevant Orders  ? Hepatitis C Antibody  ? Vitamin D deficiency      ? Relevant Orders  ?  Vitamin D (25 hydroxy)  ? Missed period      ? Relevant Orders  ? POCT urine pregnancy (Completed)  ? ?  ? ? ?Outpatient Encounter Medications as of 03/18/2022  ?Medication Sig  ? azelastine (ASTELIN) 0.1 %  nasal spray Place 1 spray into both nostrils 2 (two) times daily. Use in each nostril as directed  ? Cholecalciferol (VITAMIN D-3) 125 MCG (5000 UT) TABS Take 2 tablets by mouth daily at 12 noon.  ? Coenzyme Q10 (COQ10 PO) Take by mouth.  ? guanFACINE (INTUNIV) 1 MG TB24 ER tablet Take 1 tablet (1 mg total) by mouth daily.  ? ibuprofen (ADVIL) 800 MG tablet Take 1 tablet (800 mg total) by mouth daily as needed for mild pain or moderate pain.  ? Multiple Vitamin (MULTIVITAMIN) tablet Take 2 tablets by mouth in the morning and at bedtime.   ? OVER THE COUNTER MEDICATION Vit d 2 ?Vit e  ?elderberry  ? triamcinolone cream (KENALOG) 0.1 % Apply 1 application. topically 2 (two) times daily.  ? amoxicillin-clavulanate (AUGMENTIN) 875-125 MG tablet Take 1 tablet by mouth 2 (two) times daily. (Patient not taking: Reported on 03/18/2022)  ? cetirizine (ZYRTEC ALLERGY) 10 MG tablet Take 1 tablet (10 mg total) by mouth daily.  ? NP THYROID 60 MG tablet Take 1 tablet (60 mg total) by mouth 2 (two) times daily. (Patient not taking: Reported on 03/18/2022)  ? predniSONE (DELTASONE) 20 MG tablet Take 2 tablets (40 mg total) by mouth daily. (Patient not taking: Reported on 03/18/2022)  ? promethazine-dextromethorphan (PROMETHAZINE-DM) 6.25-15 MG/5ML syrup Take 5 mLs by mouth 4 (four) times daily as needed for cough. (Patient not taking: Reported on 03/18/2022)  ? sucralfate (CARAFATE) 1 g tablet Take 1 tablet (1 g total) by mouth 3 (three) times daily as needed. 1 tablet into a glass of water and drink as needed (Patient not taking: Reported on 03/18/2022)  ? ?No facility-administered encounter medications on file as of 03/18/2022.  ? ? ?Follow-up: Return in about 3 months (around 06/17/2022).  ? ?Alvira Monday, FNP ?

## 2022-03-18 NOTE — Patient Instructions (Addendum)
I appreciate the opportunity to provide care to you today! ?  ?Follow up: 3 months ? ?Labs: please stop by the lab today or during the week to have your blood drawn.  ? ?Please pick up your prescription at the pharmacy ( kenalog cream and  Guanfacine) ? ?  ? ? ? ?  ?It was a pleasure to see you and I look forward to continuing to work together on your health and well-being. ?Please do not hesitate to call the office if you need care or have questions about your care. ?  ?Have a wonderful day and week. ?With Gratitude, ?Alvira Monday MSN, FNP-BC  ?

## 2022-03-19 ENCOUNTER — Other Ambulatory Visit: Payer: Self-pay | Admitting: Family Medicine

## 2022-03-19 DIAGNOSIS — F900 Attention-deficit hyperactivity disorder, predominantly inattentive type: Secondary | ICD-10-CM

## 2022-03-20 ENCOUNTER — Encounter: Payer: Self-pay | Admitting: Family Medicine

## 2022-03-20 ENCOUNTER — Other Ambulatory Visit: Payer: Self-pay | Admitting: Family Medicine

## 2022-03-20 DIAGNOSIS — E7800 Pure hypercholesterolemia, unspecified: Secondary | ICD-10-CM

## 2022-03-20 LAB — LIPID PANEL
Chol/HDL Ratio: 3.4 ratio (ref 0.0–4.4)
Cholesterol, Total: 206 mg/dL — ABNORMAL HIGH (ref 100–199)
HDL: 61 mg/dL (ref >39)
LDL Chol Calc (NIH): 127 mg/dL — ABNORMAL HIGH (ref 0–99)
Triglycerides: 103 mg/dL (ref 0–149)
VLDL Cholesterol Cal: 18 mg/dL (ref 5–40)

## 2022-03-20 LAB — CBC WITH DIFFERENTIAL/PLATELET
Basophils Absolute: 0.1 10*3/uL (ref 0.0–0.2)
Basos: 1 %
EOS (ABSOLUTE): 0.3 10*3/uL (ref 0.0–0.4)
Eos: 3 %
Hematocrit: 41.2 % (ref 34.0–46.6)
Hemoglobin: 14.2 g/dL (ref 11.1–15.9)
Immature Grans (Abs): 0 10*3/uL (ref 0.0–0.1)
Immature Granulocytes: 0 %
Lymphocytes Absolute: 1.9 10*3/uL (ref 0.7–3.1)
Lymphs: 23 %
MCH: 29.8 pg (ref 26.6–33.0)
MCHC: 34.5 g/dL (ref 31.5–35.7)
MCV: 86 fL (ref 79–97)
Monocytes Absolute: 0.5 10*3/uL (ref 0.1–0.9)
Monocytes: 6 %
Neutrophils Absolute: 5.7 10*3/uL (ref 1.4–7.0)
Neutrophils: 67 %
Platelets: 183 10*3/uL (ref 150–450)
RBC: 4.77 x10E6/uL (ref 3.77–5.28)
RDW: 12.6 % (ref 11.7–15.4)
WBC: 8.4 10*3/uL (ref 3.4–10.8)

## 2022-03-20 LAB — CMP14+EGFR
ALT: 16 IU/L (ref 0–32)
AST: 16 IU/L (ref 0–40)
Albumin/Globulin Ratio: 1.5 (ref 1.2–2.2)
Albumin: 4.6 g/dL (ref 3.8–4.8)
Alkaline Phosphatase: 92 IU/L (ref 44–121)
BUN/Creatinine Ratio: 15 (ref 9–23)
BUN: 10 mg/dL (ref 6–20)
Bilirubin Total: 0.4 mg/dL (ref 0.0–1.2)
CO2: 20 mmol/L (ref 20–29)
Calcium: 9.3 mg/dL (ref 8.7–10.2)
Chloride: 105 mmol/L (ref 96–106)
Creatinine, Ser: 0.68 mg/dL (ref 0.57–1.00)
Globulin, Total: 3 g/dL (ref 1.5–4.5)
Glucose: 85 mg/dL (ref 70–99)
Potassium: 4.6 mmol/L (ref 3.5–5.2)
Sodium: 140 mmol/L (ref 134–144)
Total Protein: 7.6 g/dL (ref 6.0–8.5)
eGFR: 114 mL/min/{1.73_m2} (ref >59)

## 2022-03-20 LAB — VITAMIN D 25 HYDROXY (VIT D DEFICIENCY, FRACTURES): Vit D, 25-Hydroxy: 37.9 ng/mL (ref 30.0–100.0)

## 2022-03-20 LAB — TSH+FREE T4
Free T4: 0.96 ng/dL (ref 0.82–1.77)
TSH: 0.909 u[IU]/mL (ref 0.450–4.500)

## 2022-03-20 LAB — HEPATITIS C ANTIBODY: Hep C Virus Ab: NONREACTIVE

## 2022-03-20 LAB — HEMOGLOBIN A1C
Est. average glucose Bld gHb Est-mCnc: 105 mg/dL
Hgb A1c MFr Bld: 5.3 % (ref 4.8–5.6)

## 2022-04-17 ENCOUNTER — Ambulatory Visit
Admission: RE | Admit: 2022-04-17 | Discharge: 2022-04-17 | Disposition: A | Discharge status: Home / Self Care | Payer: Medicaid Other | Source: Ambulatory Visit | Attending: Nurse Practitioner | Admitting: Nurse Practitioner

## 2022-04-17 VITALS — BP 120/82 | HR 75 | Temp 98.1°F | Resp 18

## 2022-04-17 DIAGNOSIS — J029 Acute pharyngitis, unspecified: Secondary | ICD-10-CM | POA: Diagnosis not present

## 2022-04-17 DIAGNOSIS — Z20818 Contact with and (suspected) exposure to other bacterial communicable diseases: Secondary | ICD-10-CM | POA: Insufficient documentation

## 2022-04-17 LAB — POCT RAPID STREP A (OFFICE): Rapid Strep A Screen: NEGATIVE

## 2022-04-17 NOTE — ED Provider Notes (Signed)
?[facility] ? ? ? ?CSN: [id number] ?Arrival date & time: 04/17/22  4098 ? ? ?  ? ?History   ?Chief Complaint ?Chief Complaint  ?Patient presents with  ? Sore Throat  ?  Entered by patient  ? ? ?HPI ?Donna Kim is a 40 y.o. adult.  ? ?The patient is a 41 year old female who presents after exposure to strep.  Patient states one of her children was diagnosed with strep.  She states that she did have a sore throat, which is since resolved.  Has also had intermittent headache, which is also resolved.  She denies fever, chills, abdominal pain, nausea, vomiting or diarrhea. ? ?The history is provided by the patient.  ?Sore Throat ?The current episode started more than 2 days ago. Associated symptoms include headaches. She has tried nothing for the symptoms.  ? ?Past Medical History:  ?Diagnosis Date  ? ADHD   ? Back pain 11/12/2013  ? Cholestasis 08/2017  ? Contraceptive management 08/12/2014  ? Headache   ? Mental disorder   ? anxiety  ? No pertinent past medical history   ? Postpartum bleeding 07/15/2014  ? Rosacea   ? UTI (urinary tract infection)   ? Vaginal Pap smear, abnormal   ? ? ?Patient Active Problem List  ? Diagnosis Date Noted  ? Rash 03/18/2022  ? Mood disorder ([place]) 04/04/2021  ? Gastroesophageal reflux disease without esophagitis 09/05/2020  ? Calculus of gallbladder without cholecystitis without obstruction 09/05/2020  ? Encounter for IUD insertion 10/26/2019  ? Migraine with aura 08/19/2019  ? Gestational thrombocytopenia ([place]) 06/22/2019  ? History of postpartum hypertension 10/30/2017  ? History of cholestasis during pregnancy 08/28/2017  ? Depression with anxiety 04/14/2017  ? ADHD (attention deficit hyperactivity disorder), inattentive type 10/02/2014  ? Rosacea, acne 03/18/2014  ? ? ?Past Surgical History:  ?Procedure Laterality Date  ? WISDOM TOOTH EXTRACTION    ? ? ?OB History   ? ? Gravida  ?4  ? Para  ?4  ? Term  ?4  ? Preterm  ?   ? AB  ?   ? Living  ?4  ?  ? ? SAB  ?   ? IAB  ?   ?  Ectopic  ?   ? Multiple  ?0  ? Live Births  ?4  ?   ?  ?  ? ? ? ?Home Medications   ? ?Prior to Admission medications   ?Medication Sig Start Date End Date Taking? Authorizing Provider  ?amoxicillin-clavulanate (AUGMENTIN) 875-125 MG tablet Take 1 tablet by mouth 2 (two) times daily. ?Patient not taking: Reported on 03/18/2022 10/03/21   Jaynee Eagles, PA-C  ?azelastine (ASTELIN) 0.1 % nasal spray Place 1 spray into both nostrils 2 (two) times daily. Use in each nostril as directed 02/05/22   Volney American, PA-C  ?cetirizine (ZYRTEC ALLERGY) 10 MG tablet Take 1 tablet (10 mg total) by mouth daily. 02/05/22   Volney American, PA-C  ?Cholecalciferol (VITAMIN D-3) 125 MCG (5000 UT) TABS Take 2 tablets by mouth daily at 12 noon.    [provider]  ?Coenzyme Q10 (COQ10 PO) Take by mouth.    [provider]  ?guanFACINE (INTUNIV) 1 MG TB24 ER tablet Take 1 tablet (1 mg total) by mouth daily. 03/18/22   Alvira Monday, FNP  ?ibuprofen (ADVIL) 800 MG tablet Take 1 tablet (800 mg total) by mouth daily as needed for mild pain or moderate pain. 04/04/21   Doree Albee, MD  ?Multiple  Vitamin (MULTIVITAMIN) tablet Take 2 tablets by mouth in the morning and at bedtime.     [provider]  ?NP THYROID 60 MG tablet Take 1 tablet (60 mg total) by mouth 2 (two) times daily. ?Patient not taking: Reported on 03/18/2022 04/04/21   Doree Albee, MD  ?OVER THE COUNTER MEDICATION Vit d 2 ?Vit e  ?elderberry    [provider]  ?predniSONE (DELTASONE) 20 MG tablet Take 2 tablets (40 mg total) by mouth daily. ?Patient not taking: Reported on 03/18/2022 09/18/21   Vanessa Kick, MD  ?promethazine-dextromethorphan (PROMETHAZINE-DM) 6.25-15 MG/5ML syrup Take 5 mLs by mouth 4 (four) times daily as needed for cough. ?Patient not taking: Reported on 03/18/2022 09/18/21   Vanessa Kick, MD  ?sucralfate (CARAFATE) 1 g tablet Take 1 tablet (1 g total) by mouth 3 (three) times daily as needed. 1 tablet  into a glass of water and drink as needed ?Patient not taking: Reported on 03/18/2022 02/05/22   Volney American, PA-C  ?triamcinolone cream (KENALOG) 0.1 % Apply 1 application. topically 2 (two) times daily. 03/18/22   Alvira Monday, FNP  ? ? ?Family History ?Family History  ?Adopted: Yes  ?Problem Relation Age of Onset  ? ADD / ADHD Daughter   ? ? ?Social History ?Social History  ? ?Tobacco Use  ? Smoking status: Never  ? Smokeless tobacco: Never  ?Vaping Use  ? Vaping Use: Never used  ?Substance Use Topics  ? Alcohol use: No  ? Drug use: No  ? ? ? ?Allergies   ?Cefzil [cefprozil] ? ? ?Review of Systems ?Review of Systems  ?Constitutional: Negative.   ?HENT:  Positive for sore throat. Negative for congestion.   ?Respiratory: Negative.    ?Cardiovascular: Negative.   ?Gastrointestinal: Negative.   ?Skin: Negative.   ?Neurological:  Positive for headaches.  ?Psychiatric/Behavioral: Negative.    ? ? ?Physical Exam ?Triage Vital Signs ?ED Triage Vitals  ?Enc Vitals Group  ?   BP 04/17/22 0914 120/82  ?   Pulse Rate 04/17/22 0914 75  ?   Resp 04/17/22 0914 18  ?   Temp 04/17/22 0914 98.1 ?F (36.7 ?C)  ?   Temp Source 04/17/22 0914 Oral  ?   SpO2 04/17/22 0914 97 %  ?   Weight --   ?   Height --   ?   Head Circumference --   ?   Peak Flow --   ?   Pain Score 04/17/22 0916 2  ?   Pain Loc --   ?   Pain Edu? --   ?   Excl. in [place]? --   ? ?No data found. ? ?Updated Vital Signs ?BP 120/82 (BP Location: Right Arm)   Pulse 75   Temp 98.1 ?F (36.7 ?C) (Oral)   Resp 18   LMP 03/27/2022 (Approximate)   SpO2 97%  ? ?Visual Acuity ?Right Eye Distance:   ?Left Eye Distance:   ?Bilateral Distance:   ? ?Right Eye Near:   ?Left Eye Near:    ?Bilateral Near:    ? ?Physical Exam ?Vitals and nursing note reviewed.  ?Constitutional:   ?   General: She is not in acute distress. ?   Appearance: She is well-developed.  ?HENT:  ?   Head: Normocephalic.  ?   Right Ear: Tympanic membrane and ear canal normal.  ?   Left Ear: Tympanic  membrane and ear canal normal.  ?   Nose: No congestion or rhinorrhea.  ?  Mouth/Throat:  ?   Mouth: Mucous membranes are moist.  ?   Tonsils: No tonsillar exudate or tonsillar abscesses. 0 on the right. 0 on the left.  ?Cardiovascular:  ?   Rate and Rhythm: Normal rate and regular rhythm.  ?   Heart sounds: Normal heart sounds.  ?Pulmonary:  ?   Effort: Pulmonary effort is normal.  ?   Breath sounds: Normal breath sounds.  ?Abdominal:  ?   General: Bowel sounds are normal. There is no distension.  ?   Palpations: Abdomen is soft.  ?   Tenderness: There is no abdominal tenderness. There is no guarding or rebound.  ?Genitourinary: ?   Vagina: Normal. No vaginal discharge.  ?Musculoskeletal:  ?   Cervical back: Normal range of motion.  ?Skin: ?   General: Skin is warm and dry.  ?   Findings: No erythema or rash.  ?Neurological:  ?   General: No focal deficit present.  ?   Mental Status: She is alert and oriented to person, place, and time.  ?   Cranial Nerves: No cranial nerve deficit.  ?Psychiatric:     ?   Mood and Affect: Mood normal.     ?   Behavior: Behavior normal.  ? ? ? ?UC Treatments / Results  ?Labs ?(all labs ordered are listed, but only abnormal results are displayed) ?Labs Reviewed  ?CULTURE, GROUP A STREP [facility])  ?POCT RAPID STREP A (OFFICE)  ? ? ?EKG ? ? ?Radiology ?No results found. ? ?Procedures ?Procedures (including critical care time) ? ?Medications Ordered in UC ?Medications - No data to display ? ?Initial Impression / Assessment and Plan / UC Course  ?I have reviewed the triage vital signs and the nursing notes. ? ?Pertinent labs & imaging results that were available during my care of the patient were reviewed by me and considered in my medical decision making (see chart for details). ? ?The patient is a 41year-old female who presents for strep throat exposure.  Patient's mother states  one of her children was diagnosed with strep throat.  She states she had a sore throat and headache, which  has since resolved. Her exam is reassuring at this time as she does not have any tonsil swelling, or exudate.  Her rapid strep test is negative today.  A throat culture has been ordered.  Patient advised that

## 2022-04-17 NOTE — ED Triage Notes (Signed)
Sore throat since Sunday.  Children are positive for strep. ?

## 2022-04-17 NOTE — Discharge Instructions (Signed)
Your rapid strep test was negative today. ?May take Ibuprofen or Tylenol as needed for pain or fever. ?Increase fluids and get plenty of rest. ?You will be contacted if the culture is positive to provide treatment.  ?Follow-up as needed.  ?

## 2022-04-20 LAB — CULTURE, GROUP A STREP (THRC)

## 2022-05-07 ENCOUNTER — Ambulatory Visit: Payer: Medicaid Other | Admitting: Family Medicine

## 2022-05-07 ENCOUNTER — Encounter: Payer: Self-pay | Admitting: Family Medicine

## 2022-05-07 VITALS — BP 118/80 | HR 68 | Resp 18 | Ht 65.0 in | Wt 219.6 lb

## 2022-05-07 DIAGNOSIS — K219 Gastro-esophageal reflux disease without esophagitis: Secondary | ICD-10-CM | POA: Diagnosis not present

## 2022-05-07 DIAGNOSIS — D171 Benign lipomatous neoplasm of skin and subcutaneous tissue of trunk: Secondary | ICD-10-CM | POA: Diagnosis not present

## 2022-05-07 DIAGNOSIS — E7800 Pure hypercholesterolemia, unspecified: Secondary | ICD-10-CM | POA: Diagnosis not present

## 2022-05-07 DIAGNOSIS — E038 Other specified hypothyroidism: Secondary | ICD-10-CM | POA: Diagnosis not present

## 2022-05-07 DIAGNOSIS — E039 Hypothyroidism, unspecified: Secondary | ICD-10-CM | POA: Insufficient documentation

## 2022-05-07 MED ORDER — THYROID 30 MG PO TABS: 30.0000 mg | ORAL_TABLET | Freq: Every day | ORAL | 1 refills | Status: DC

## 2022-05-07 NOTE — Assessment & Plan Note (Addendum)
-  c/o of hair loss, low sex drive and cold intolerance.  - will like to try armour thyroid to see if symptoms would subside.  -d/c NP thyroid and will start a trial of armour thyroid '30mg'$  -advised pt to get labs in 1 month to assess her thyroid levels

## 2022-05-07 NOTE — Assessment & Plan Note (Addendum)
-  c/o of acid reflux on 04/02/22 -.she reports symptoms of choking, gagging, trouble swallowing and heartburn that awoke her from her sleep - symptoms occurrence was once with no reported episodes since -reports seeing ENT in 2011 with findings of silent reflux  -pt was recommendations to avoid GERD-producing foods - pt admits that she hasn't been adherent to the GERD diet but is now making changes  -pt refuses pharmacological treatment with PPI at the moment - inform the patient to avoid GERD-producing foods and will reconvene if symptoms persist

## 2022-05-07 NOTE — Patient Instructions (Signed)
I appreciate the opportunity to provide care to you today!  Labs: please stop by the lab in 1 month to get your thyroids levels checked  -Please pick up your medication at the pharmacy   Please continue to a heart-healthy diet and increase your physical activities. Try to exercise for 42mns at least three times a week.      It was a pleasure to see you and I look forward to continuing to work together on your health and well-being. Please do not hesitate to call the office if you need care or have questions about your care.   Have a wonderful day and week. With Gratitude, GAlvira MondayMSN, FNP-BC

## 2022-05-07 NOTE — Assessment & Plan Note (Addendum)
-  benign - assessment findings consist of a round/ oval shape fatty lump on the right cheek of the buttocks -treatment is not warranted as they are benign and not harmful

## 2022-05-07 NOTE — Progress Notes (Addendum)
Established Patient Office Visit  Subjective:  Patient ID: Donna Kim, adult    DOB: November 27, 1981  Age: 41 y.o. MRN: [id number]  CC:  Chief Complaint  Patient presents with   Thyroid Problem    Pt wanted to discuss medication options for thyroid as her numbers are still low also wants to ask about medication for acid reflux this has been going on since 05-05-22 and she noticed 2 weeks ago a lump on her bottom doesn't hurt just wanted it looked at     HPI Donna Kim is a 41 y.o. adult with past medical history of migraine with aura, GERD without esophagitis presents three major complaints.She has lost 5 lbs since her last visit on 03/18/22 and is excited about the weight loss; she has been intentionally trying to lose weight by implementing lifestyle changes such as eating healthier and exercising.   Hypothyroidism: Thyroid levels were low normal when labs resulted on 03/18/22. She was taking NP thyroid, provided by her previous provider. She refuses synthroid therapy and prefers organic supplements. She c/o of hair loss, low sex drive and cold intolerance. She will like to try armour thyroid to see if symptoms would subside.     Acid Reflux: C/o of acid reflux on 04/02/22.She reports symptoms of choking, gagging, trouble swallowing, and heartburn that awoke her from her sleep. Symptoms occurrence was once with no reported episodes since. Reports seeing ENT in 2011 with findings of silent reflux. The patient was recommended to avoid GERD-producing foods but admits that she hasn't been adherent to the GERD diet but is now making changes and doesn't want to be placed on pharmacological treatments.   Lumps on her bottom: onset is 2 weeks, no reported changes in size and characteristics.    Past Medical History:  Diagnosis Date   ADHD    Back pain 11/12/2013   Cholestasis 08/2017   Contraceptive management 08/12/2014   Headache    Mental disorder    anxiety   No pertinent past medical  history    Postpartum bleeding 07/15/2014   Rosacea    UTI (urinary tract infection)    Vaginal Pap smear, abnormal     Past Surgical History:  Procedure Laterality Date   WISDOM TOOTH EXTRACTION      Family History  Adopted: Yes  Problem Relation Age of Onset   ADD / ADHD Daughter     Social History   Socioeconomic History   Marital status: Married    Spouse name: Not on file   Number of children: 4   Years of education: 16   Highest education level: Not on file  Occupational History   Occupation: stay at home mom  Tobacco Use   Smoking status: Never   Smokeless tobacco: Never  Vaping Use   Vaping Use: Never used  Substance and Sexual Activity   Alcohol use: No   Drug use: No   Sexual activity: Yes    Birth control/protection: None, Condom  Other Topics Concern   Not on file  Social History Narrative   Married 12 years.Lives with husband and kids.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   Social Determinants of Health   Financial Resource Strain: Not on file  Food Insecurity: Not on file  Transportation Needs: Not on file  Physical Activity: Not on file  Stress: Not on file  Social Connections: Not on file  Intimate Partner Violence: Not on file    Outpatient Medications Prior to Visit  Medication Sig Dispense  Refill   azelastine (ASTELIN) 0.1 % nasal spray Place 1 spray into both nostrils 2 (two) times daily. Use in each nostril as directed 30 mL 0   Cholecalciferol (VITAMIN D-3) 125 MCG (5000 UT) TABS Take 2 tablets by mouth daily at 12 noon.     Coenzyme Q10 (COQ10 PO) Take by mouth.     guanFACINE (INTUNIV) 1 MG TB24 ER tablet Take 1 tablet (1 mg total) by mouth daily. 30 tablet 0   ibuprofen (ADVIL) 800 MG tablet Take 1 tablet (800 mg total) by mouth daily as needed for mild pain or moderate pain. 30 tablet 2   Multiple Vitamin (MULTIVITAMIN) tablet Take 2 tablets by mouth in the morning and at bedtime.      OVER THE COUNTER MEDICATION Vit d 2 Vit e   elderberry     triamcinolone cream (KENALOG) 0.1 % Apply 1 application. topically 2 (two) times daily. 30 g 0   sucralfate (CARAFATE) 1 g tablet Take 1 tablet (1 g total) by mouth 3 (three) times daily as needed. 1 tablet into a glass of water and drink as needed (Patient not taking: Reported on 03/18/2022) 30 tablet 0   amoxicillin-clavulanate (AUGMENTIN) 875-125 MG tablet Take 1 tablet by mouth 2 (two) times daily. (Patient not taking: Reported on 03/18/2022) 14 tablet 0   cetirizine (ZYRTEC ALLERGY) 10 MG tablet Take 1 tablet (10 mg total) by mouth daily. 30 tablet 0   NP THYROID 60 MG tablet Take 1 tablet (60 mg total) by mouth 2 (two) times daily. (Patient not taking: Reported on 03/18/2022) 60 tablet 3   predniSONE (DELTASONE) 20 MG tablet Take 2 tablets (40 mg total) by mouth daily. (Patient not taking: Reported on 03/18/2022) 10 tablet 0   promethazine-dextromethorphan (PROMETHAZINE-DM) 6.25-15 MG/5ML syrup Take 5 mLs by mouth 4 (four) times daily as needed for cough. (Patient not taking: Reported on 03/18/2022) 118 mL 0   No facility-administered medications prior to visit.    Allergies  Allergen Reactions   Cefzil [Cefprozil] Itching and Nausea And Vomiting    ROS Review of Systems  Constitutional:  Positive for fatigue. Negative for chills and fever.  HENT:  Positive for trouble swallowing (once on 04/02/22). Negative for mouth sores and tinnitus.   Eyes:  Negative for photophobia and visual disturbance.  Respiratory:  Negative for apnea.   Cardiovascular:  Negative for chest pain and palpitations.  Gastrointestinal:  Negative for rectal pain.  Endocrine: Positive for cold intolerance.  Skin:        Lump on her bottom  Neurological:  Negative for tremors and syncope.  Psychiatric/Behavioral:  Negative for confusion.      Objective:    Physical Exam Constitutional:      Appearance: Normal appearance.  HENT:     Head: Normocephalic.  Cardiovascular:     Rate and Rhythm:  Normal rate and regular rhythm.     Pulses: Normal pulses.     Heart sounds: Normal heart sounds.  Pulmonary:     Effort: Pulmonary effort is normal.     Breath sounds: Normal breath sounds.  Musculoskeletal:     Cervical back: No rigidity.     Right lower leg: No edema.     Left lower leg: No edema.  Skin:    General: Skin is warm.     Comments: ound/ oval shape fatty lump on the right cheek of the buttocks  Neurological:     Mental Status: She is alert.  BP 118/80 (BP Location: Right Arm, Patient Position: Sitting, Cuff Size: Normal)   Pulse 68   Resp 18   Ht 5' 5"  (1.651 m)   Wt 219 lb 9.6 oz (99.6 kg)   SpO2 100%   BMI 36.54 kg/m  Wt Readings from Last 3 Encounters:  05/07/22 219 lb 9.6 oz (99.6 kg)  03/18/22 224 lb 9.6 oz (101.9 kg)  02/05/22 219 lb (99.3 kg)    Lab Results  Component Value Date   TSH 0.909 03/18/2022   Lab Results  Component Value Date   WBC 8.4 03/18/2022   HGB 14.2 03/18/2022   HCT 41.2 03/18/2022   MCV 86 03/18/2022   PLT 183 03/18/2022   Lab Results  Component Value Date   NA 140 03/18/2022   K 4.6 03/18/2022   CO2 20 03/18/2022   GLUCOSE 85 03/18/2022   BUN 10 03/18/2022   CREATININE 0.68 03/18/2022   BILITOT 0.4 03/18/2022   ALKPHOS 92 03/18/2022   AST 16 03/18/2022   ALT 16 03/18/2022   PROT 7.6 03/18/2022   ALBUMIN 4.6 03/18/2022   CALCIUM 9.3 03/18/2022   ANIONGAP 9 05/20/2020   EGFR 114 03/18/2022   Lab Results  Component Value Date   CHOL 206 (H) 03/18/2022   Lab Results  Component Value Date   HDL 61 03/18/2022   Lab Results  Component Value Date   LDLCALC 127 (H) 03/18/2022   Lab Results  Component Value Date   TRIG 103 03/18/2022   Lab Results  Component Value Date   CHOLHDL 3.4 03/18/2022   Lab Results  Component Value Date   HGBA1C 5.3 03/18/2022      Assessment & Plan:   Problem List Items Addressed This Visit       Digestive   Gastroesophageal reflux disease without esophagitis      -c/o of acid reflux on 04/02/22 -.she reports symptoms of choking, gagging, trouble swallowing and heartburn that awoke her from her sleep - symptoms occurrence was once with no reported episodes since -reports seeing ENT in 2011 with findings of silent reflux  -pt was recommendations to avoid GERD-producing foods - pt admits that she hasn't been adherent to the GERD diet but is now making changes  -pt refuses pharmacological treatment with PPI at the moment - inform the patient to avoid GERD-producing foods and will reconvene if symptoms persist           Endocrine   Hypothyroidism - Primary    -c/o of hair loss, low sex drive and cold intolerance.  - will like to try armour thyroid to see if symptoms would subside.  -d/c NP thyroid and will start a trial of armour thyroid 89m -advised pt to get labs in 1 month to assess her thyroid levels      Relevant Medications   thyroid (ARMOUR THYROID) 30 MG tablet   Other Relevant Orders   TSH+T4F+T3Free     Other   Lipoma of buttock    -benign - assessment findings consist of a round/ oval shape fatty lump on the right cheek of the buttocks -treatment is not warranted as they are benign and not harmful       Other Visit Diagnoses     Elevated cholesterol       Relevant Orders   Lipid panel       Meds ordered this encounter  Medications   thyroid (ARMOUR THYROID) 30 MG tablet    Sig: Take 1 tablet (30  mg total) by mouth daily before breakfast.    Dispense:  30 tablet    Refill:  1    Follow-up: No follow-ups on file.    Alvira Monday, FNP

## 2022-05-08 LAB — LIPID PANEL
Chol/HDL Ratio: 3.9 ratio (ref 0.0–4.4)
Cholesterol, Total: 172 mg/dL (ref 100–199)
HDL: 44 mg/dL (ref >39)
LDL Chol Calc (NIH): 110 mg/dL — ABNORMAL HIGH (ref 0–99)
Triglycerides: 100 mg/dL (ref 0–149)
VLDL Cholesterol Cal: 18 mg/dL (ref 5–40)

## 2022-05-14 NOTE — Progress Notes (Signed)
Please inform the patient to continue healthy lifestyle choices and that I'm proud of her efforts. Her cholesterol is lower and has improved from the last time it was checked.

## 2022-06-17 ENCOUNTER — Ambulatory Visit: Payer: Medicaid Other | Admitting: Family Medicine

## 2022-06-25 ENCOUNTER — Ambulatory Visit: Payer: Medicaid Other | Admitting: Family Medicine

## 2022-09-11 ENCOUNTER — Ambulatory Visit: Payer: Medicaid Other | Admitting: Family Medicine

## 2022-10-10 ENCOUNTER — Ambulatory Visit: Payer: Medicaid Other | Admitting: Family Medicine

## 2022-10-10 ENCOUNTER — Encounter: Payer: Self-pay | Admitting: Family Medicine

## 2022-10-10 VITALS — BP 125/85 | HR 79 | Ht 65.0 in | Wt 230.0 lb

## 2022-10-10 DIAGNOSIS — E7849 Other hyperlipidemia: Secondary | ICD-10-CM

## 2022-10-10 DIAGNOSIS — J302 Other seasonal allergic rhinitis: Secondary | ICD-10-CM | POA: Diagnosis not present

## 2022-10-10 DIAGNOSIS — E038 Other specified hypothyroidism: Secondary | ICD-10-CM | POA: Diagnosis not present

## 2022-10-10 DIAGNOSIS — J069 Acute upper respiratory infection, unspecified: Secondary | ICD-10-CM | POA: Diagnosis not present

## 2022-10-10 DIAGNOSIS — R7301 Impaired fasting glucose: Secondary | ICD-10-CM

## 2022-10-10 DIAGNOSIS — E559 Vitamin D deficiency, unspecified: Secondary | ICD-10-CM

## 2022-10-10 MED ORDER — AZELASTINE HCL 0.1 % NA SOLN: 2.0000 | Freq: Every day | NASAL | 0 refills | Status: AC

## 2022-10-10 MED ORDER — THYROID 30 MG PO TABS: 30.0000 mg | ORAL_TABLET | Freq: Every day | ORAL | 2 refills | Status: DC

## 2022-10-10 NOTE — Assessment & Plan Note (Signed)
Will assess patient's thyroid levels today Discontinued Armour Thyroid 30 mg as patient reports minimal relief of her symptoms with treatment regimen Encouraged to continue taking over-the-counter supplement

## 2022-10-10 NOTE — Assessment & Plan Note (Signed)
Symptomatic management encourage Informed the patient that the lingering cough will clear up on its own in a few weeks

## 2022-10-10 NOTE — Progress Notes (Signed)
Established Patient Office Visit  Subjective:  Patient ID: Donna Kim, adult    DOB: [birth year]  Age: 41 y.o. MRN: [id number]  CC:  Chief Complaint  Patient presents with   Follow-up    3 month f/u, pt reports bronchial cough onset since 10/02/2022.     HPI Donna Kim is a 41 y.o. adult with past medical history of hypothyroidism, migraine with aura, and ADHD presents for f/u of  chronic medical conditions.  Hypothyroidism: She takes Armour Thyroid 30 mg daily and reports minimal relief of her symptoms.  She reports taking thyroid supplement over-the-counter that has provided relief of her symptoms.  She reports cutting out gluten from her diet to help with weight management.  She reports feeling better overall.   Upper respiratory infection: She reports that her family had URI last week with a lingering cough thereafter.  She denies fever, nasal congestion, body aches, sore throat, sinus pain, and sinus pressure.  She reports that her cough is nonproductive and occurs sporadically.  She has not taken any medication for her cough but reports to have increased her intake of vitamin C.   Past Medical History:  Diagnosis Date   ADHD    Back pain 11/12/2013   Cholestasis 08/2017   Contraceptive management 08/12/2014   Headache    Mental disorder    anxiety   No pertinent past medical history    Postpartum bleeding 07/15/2014   Rosacea    UTI (urinary tract infection)    Vaginal Pap smear, abnormal     Past Surgical History:  Procedure Laterality Date   WISDOM TOOTH EXTRACTION      Family History  Adopted: Yes  Problem Relation Age of Onset   ADD / ADHD Daughter     Social History   Socioeconomic History   Marital status: Married    Spouse name: Not on file   Number of children: 4   Years of education: 16   Highest education level: Not on file  Occupational History   Occupation: stay at home mom  Tobacco Use   Smoking status: Never   Smokeless tobacco:  Never  Vaping Use   Vaping Use: Never used  Substance and Sexual Activity   Alcohol use: No   Drug use: No   Sexual activity: Yes    Birth control/protection: None, Condom  Other Topics Concern   Not on file  Social History Narrative   Married 12 years.Lives with husband and kids.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   Social Determinants of Health   Financial Resource Strain: Not on file  Food Insecurity: Not on file  Transportation Needs: Not on file  Physical Activity: Not on file  Stress: Not on file  Social Connections: Not on file  Intimate Partner Violence: Not on file    Outpatient Medications Prior to Visit  Medication Sig Dispense Refill   ibuprofen (ADVIL) 800 MG tablet Take 1 tablet (800 mg total) by mouth daily as needed for mild pain or moderate pain. 30 tablet 2   guanFACINE (INTUNIV) 1 MG TB24 ER tablet Take 1 tablet (1 mg total) by mouth daily. 30 tablet 0   sucralfate (CARAFATE) 1 g tablet Take 1 tablet (1 g total) by mouth 3 (three) times daily as needed. 1 tablet into a glass of water and drink as needed 30 tablet 0   triamcinolone cream (KENALOG) 0.1 % Apply 1 application. topically 2 (two) times daily. 30 g 0   Cholecalciferol (VITAMIN D-3)  125 MCG (5000 UT) TABS Take 2 tablets by mouth daily at 12 noon.     Multiple Vitamin (MULTIVITAMIN) tablet Take 2 tablets by mouth in the morning and at bedtime.  (Patient not taking: Reported on 10/10/2022)     OVER THE COUNTER MEDICATION Vit d 2 Vit e  elderberry (Patient not taking: Reported on 10/10/2022)     azelastine (ASTELIN) 0.1 % nasal spray Place 1 spray into both nostrils 2 (two) times daily. Use in each nostril as directed (Patient not taking: Reported on 10/10/2022) 30 mL 0   Coenzyme Q10 (COQ10 PO) Take by mouth. (Patient not taking: Reported on 10/10/2022)     thyroid (ARMOUR THYROID) 30 MG tablet Take 1 tablet (30 mg total) by mouth daily before breakfast. (Patient not taking: Reported on 10/10/2022) 30 tablet  1   No facility-administered medications prior to visit.    Allergies  Allergen Reactions   Cefzil [Cefprozil] Itching and Nausea And Vomiting    ROS Review of Systems  Constitutional:  Negative for chills and fever.  HENT:  Negative for congestion, sinus pressure, sinus pain and sore throat.   Eyes:  Negative for discharge and itching.  Respiratory:  Positive for cough. Negative for chest tightness.   Cardiovascular:  Negative for chest pain and palpitations.  Neurological:  Negative for dizziness.      Objective:    Physical Exam HENT:     Head: Normocephalic.  Cardiovascular:     Rate and Rhythm: Normal rate and regular rhythm.     Pulses: Normal pulses.     Heart sounds: Normal heart sounds.  Pulmonary:     Effort: Pulmonary effort is normal.     Breath sounds: Normal breath sounds. No stridor.  Musculoskeletal:     Cervical back: No rigidity.  Neurological:     Mental Status: She is alert.     BP 125/85   Pulse 79   Ht _0  (1.651 m)   Wt 230 lb 0.6 oz (104.3 kg)   SpO2 97%   BMI 38.28 kg/m  Wt Readings from Last 3 Encounters:  10/10/22 230 lb 0.6 oz (104.3 kg)  05/07/22 219 lb 9.6 oz (99.6 kg)  03/18/22 224 lb 9.6 oz (101.9 kg)    Lab Results  Component Value Date   TSH 0.909 03/18/2022   Lab Results  Component Value Date   WBC 8.4 03/18/2022   HGB 14.2 03/18/2022   HCT 41.2 03/18/2022   MCV 86 03/18/2022   PLT 183 03/18/2022   Lab Results  Component Value Date   NA 140 03/18/2022   K 4.6 03/18/2022   CO2 20 03/18/2022   GLUCOSE 85 03/18/2022   BUN 10 03/18/2022   CREATININE 0.68 03/18/2022   BILITOT 0.4 03/18/2022   ALKPHOS 92 03/18/2022   AST 16 03/18/2022   ALT 16 03/18/2022   PROT 7.6 03/18/2022   ALBUMIN 4.6 03/18/2022   CALCIUM 9.3 03/18/2022   ANIONGAP 9 05/20/2020   EGFR 114 03/18/2022   Lab Results  Component Value Date   CHOL 172 05/07/2022   Lab Results  Component Value Date   HDL 44 05/07/2022   Lab Results   Component Value Date   LDLCALC 110 (H) 05/07/2022   Lab Results  Component Value Date   TRIG 100 05/07/2022   Lab Results  Component Value Date   CHOLHDL 3.9 05/07/2022   Lab Results  Component Value Date   HGBA1C 5.3 03/18/2022  Assessment & Plan:   Problem List Items Addressed This Visit       Respiratory   URI (upper respiratory infection) - Primary    Symptomatic management encourage Informed the patient that the lingering cough will clear up on its own in a few weeks        Endocrine   Hypothyroidism    Will assess patient's thyroid levels today Discontinued Armour Thyroid 30 mg as patient reports minimal relief of her symptoms with treatment regimen Encouraged to continue taking over-the-counter supplement      Relevant Orders   TSH + free T4   Other Visit Diagnoses     Seasonal allergies       Relevant Medications   azelastine (ASTELIN) 0.1 % nasal spray   IFG (impaired fasting glucose)       Relevant Orders   Hemoglobin A1c   Vitamin D deficiency       Relevant Orders   VITAMIN D 25 Hydroxy (Vit-D Deficiency, Fractures)   Other hyperlipidemia       Relevant Orders   Lipid panel   CMP14+EGFR   CBC with Differential/Platelet       Meds ordered this encounter  Medications   azelastine (ASTELIN) 0.1 % nasal spray    Sig: Place 2 sprays into both nostrils daily. Use in each nostril as directed    Dispense:  30 mL    Refill:  0   DISCONTD: thyroid (ARMOUR THYROID) 30 MG tablet    Sig: Take 1 tablet (30 mg total) by mouth daily before breakfast.    Dispense:  30 tablet    Refill:  2    Follow-up: Return in about 4 months (around 02/08/2023).    Alvira Monday, FNP

## 2022-10-10 NOTE — Patient Instructions (Signed)
I appreciate the opportunity to provide care to you today!    Follow up:  4 months  Labs: please stop by the lab today to get your blood drawn (CBC, CMP, TSH, Lipid profile, HgA1c, Vit D)   Please pick up your medications at the pharmacy  Please continue to a heart-healthy diet and increase your physical activities. Try to exercise for 87mns at least three times a week.      It was a pleasure to see you and I look forward to continuing to work together on your health and well-being. Please do not hesitate to call the office if you need care or have questions about your care.   Have a wonderful day and week. With Gratitude, GAlvira MondayMSN, FNP-BC

## 2022-10-11 ENCOUNTER — Other Ambulatory Visit: Payer: Self-pay | Admitting: Family Medicine

## 2022-10-11 ENCOUNTER — Encounter: Payer: Self-pay | Admitting: Family Medicine

## 2022-10-11 DIAGNOSIS — L03012 Cellulitis of left finger: Secondary | ICD-10-CM

## 2022-10-11 LAB — CBC WITH DIFFERENTIAL/PLATELET
Basophils Absolute: 0 10*3/uL (ref 0.0–0.2)
Basos: 1 %
EOS (ABSOLUTE): 0.2 10*3/uL (ref 0.0–0.4)
Eos: 2 %
Hematocrit: 41.4 % (ref 34.0–46.6)
Hemoglobin: 13.7 g/dL (ref 11.1–15.9)
Immature Grans (Abs): 0 10*3/uL (ref 0.0–0.1)
Immature Granulocytes: 0 %
Lymphocytes Absolute: 2.1 10*3/uL (ref 0.7–3.1)
Lymphs: 30 %
MCH: 29.1 pg (ref 26.6–33.0)
MCHC: 33.1 g/dL (ref 31.5–35.7)
MCV: 88 fL (ref 79–97)
Monocytes Absolute: 0.4 10*3/uL (ref 0.1–0.9)
Monocytes: 6 %
Neutrophils Absolute: 4.4 10*3/uL (ref 1.4–7.0)
Neutrophils: 61 %
Platelets: 196 10*3/uL (ref 150–450)
RBC: 4.7 x10E6/uL (ref 3.77–5.28)
RDW: 12.9 % (ref 11.7–15.4)
WBC: 7.1 10*3/uL (ref 3.4–10.8)

## 2022-10-11 LAB — TSH+FREE T4
Free T4: 1.18 ng/dL (ref 0.82–1.77)
TSH: 1.08 u[IU]/mL (ref 0.450–4.500)

## 2022-10-11 LAB — VITAMIN D 25 HYDROXY (VIT D DEFICIENCY, FRACTURES): Vit D, 25-Hydroxy: 26.7 ng/mL — ABNORMAL LOW (ref 30.0–100.0)

## 2022-10-11 LAB — HEMOGLOBIN A1C
Est. average glucose Bld gHb Est-mCnc: 108 mg/dL
Hgb A1c MFr Bld: 5.4 % (ref 4.8–5.6)

## 2022-10-11 LAB — CMP14+EGFR
ALT: 19 IU/L (ref 0–32)
AST: 15 IU/L (ref 0–40)
Albumin/Globulin Ratio: 1.4 (ref 1.2–2.2)
Albumin: 4.2 g/dL (ref 3.9–4.9)
Alkaline Phosphatase: 92 IU/L (ref 44–121)
BUN/Creatinine Ratio: 22 (ref 9–23)
BUN: 14 mg/dL (ref 6–20)
Bilirubin Total: 0.4 mg/dL (ref 0.0–1.2)
CO2: 21 mmol/L (ref 20–29)
Calcium: 9.4 mg/dL (ref 8.7–10.2)
Chloride: 106 mmol/L (ref 96–106)
Creatinine, Ser: 0.64 mg/dL (ref 0.57–1.00)
Globulin, Total: 2.9 g/dL (ref 1.5–4.5)
Glucose: 95 mg/dL (ref 70–99)
Potassium: 4.3 mmol/L (ref 3.5–5.2)
Sodium: 138 mmol/L (ref 134–144)
Total Protein: 7.1 g/dL (ref 6.0–8.5)
eGFR: 116 mL/min/{1.73_m2} (ref >59)

## 2022-10-11 LAB — LIPID PANEL
Chol/HDL Ratio: 4 ratio (ref 0.0–4.4)
Cholesterol, Total: 201 mg/dL — ABNORMAL HIGH (ref 100–199)
HDL: 50 mg/dL (ref >39)
LDL Chol Calc (NIH): 131 mg/dL — ABNORMAL HIGH (ref 0–99)
Triglycerides: 111 mg/dL (ref 0–149)
VLDL Cholesterol Cal: 20 mg/dL (ref 5–40)

## 2022-10-11 MED ORDER — MUPIROCIN 2 % EX OINT: 1.0000 | TOPICAL_OINTMENT | Freq: Two times a day (BID) | CUTANEOUS | 0 refills | Status: AC

## 2022-11-06 ENCOUNTER — Encounter: Payer: Self-pay | Admitting: Family Medicine

## 2022-11-06 ENCOUNTER — Ambulatory Visit: Payer: Medicaid Other | Admitting: Family Medicine

## 2022-11-06 VITALS — BP 127/82 | HR 76 | Resp 16 | Ht 65.0 in | Wt 229.0 lb

## 2022-11-06 DIAGNOSIS — K900 Celiac disease: Secondary | ICD-10-CM

## 2022-11-06 NOTE — Progress Notes (Unsigned)
Established Patient Office Visit  Subjective:  Patient ID: Donna Kim, adult    DOB: Aug 07, 1981  Age: 41 y.o. MRN: [id number]  CC:  Chief Complaint  Patient presents with   Food Intolerance    Wants to know if she can be tested for celiac disease. Having migraines, anxiety, bloating and insomnia since eating gluten again after being off of it in Nov     HPI Donna Kim is a 41 y.o. adult with past medical history of hypothyroidism, migraine with aura, ADHD, depression with anxiety, and rosacea acne presents for f/u of  chronic medical conditions.  Screening for celiac disease: The patient would like to be screened today for celiac disease; she notes having symptoms of bloating, fatigue, migraines, depression, and anxiety when she eats foods that contain gluten.  Past Medical History:  Diagnosis Date   ADHD    Back pain 11/12/2013   Cholestasis 08/2017   Contraceptive management 08/12/2014   Headache    Mental disorder    anxiety   No pertinent past medical history    Postpartum bleeding 07/15/2014   Rosacea    UTI (urinary tract infection)    Vaginal Pap smear, abnormal     Past Surgical History:  Procedure Laterality Date   WISDOM TOOTH EXTRACTION      Family History  Adopted: Yes  Problem Relation Age of Onset   ADD / ADHD Daughter     Social History   Socioeconomic History   Marital status: Married    Spouse name: Not on file   Number of children: 4   Years of education: 16   Highest education level: Not on file  Occupational History   Occupation: stay at home mom  Tobacco Use   Smoking status: Never   Smokeless tobacco: Never  Vaping Use   Vaping Use: Never used  Substance and Sexual Activity   Alcohol use: No   Drug use: No   Sexual activity: Yes    Birth control/protection: None, Condom  Other Topics Concern   Not on file  Social History Narrative   Married 12 years.Lives with husband and kids.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    Social Determinants of Health   Financial Resource Strain: Not on file  Food Insecurity: Not on file  Transportation Needs: Not on file  Physical Activity: Not on file  Stress: Not on file  Social Connections: Not on file  Intimate Partner Violence: Not on file    Outpatient Medications Prior to Visit  Medication Sig Dispense Refill   azelastine (ASTELIN) 0.1 % nasal spray Place 2 sprays into both nostrils daily. Use in each nostril as directed 30 mL 0   ibuprofen (ADVIL) 800 MG tablet Take 1 tablet (800 mg total) by mouth daily as needed for mild pain or moderate pain. 30 tablet 2   Multiple Vitamin (MULTIVITAMIN) tablet Take 2 tablets by mouth in the morning and at bedtime.     mupirocin ointment (BACTROBAN) 2 % Apply 1 Application topically 2 (two) times daily. 22 g 0   OVER THE COUNTER MEDICATION Vit d 2 Vit e  elderberry     Cholecalciferol (VITAMIN D-3) 125 MCG (5000 UT) TABS Take 2 tablets by mouth daily at 12 noon.     No facility-administered medications prior to visit.    Allergies  Allergen Reactions   Cefzil [Cefprozil] Itching and Nausea And Vomiting    ROS Review of Systems  Constitutional:  Negative for chills, fatigue and fever.  Eyes:  Negative for visual disturbance.  Respiratory:  Negative for chest tightness and shortness of breath.   Neurological:  Negative for dizziness and headaches.      Objective:    Physical Exam HENT:     Head: Normocephalic.     Mouth/Throat:     Mouth: Mucous membranes are moist.  Cardiovascular:     Rate and Rhythm: Tachycardia present.     Heart sounds: Normal heart sounds.  Pulmonary:     Effort: Pulmonary effort is normal.     Breath sounds: Normal breath sounds. No wheezing.  Neurological:     Mental Status: She is alert.     BP 127/82   Pulse 76   Resp 16   Ht _0  (1.651 m)   Wt 229 lb (103.9 kg)   SpO2 95%   BMI 38.11 kg/m  Wt Readings from Last 3 Encounters:  11/06/22 229 lb (103.9 kg)   10/10/22 230 lb 0.6 oz (104.3 kg)  05/07/22 219 lb 9.6 oz (99.6 kg)    Lab Results  Component Value Date   TSH 1.080 10/10/2022   Lab Results  Component Value Date   WBC 7.1 10/10/2022   HGB 13.7 10/10/2022   HCT 41.4 10/10/2022   MCV 88 10/10/2022   PLT 196 10/10/2022   Lab Results  Component Value Date   NA 138 10/10/2022   K 4.3 10/10/2022   CO2 21 10/10/2022   GLUCOSE 95 10/10/2022   BUN 14 10/10/2022   CREATININE 0.64 10/10/2022   BILITOT 0.4 10/10/2022   ALKPHOS 92 10/10/2022   AST 15 10/10/2022   ALT 19 10/10/2022   PROT 7.1 10/10/2022   ALBUMIN 4.2 10/10/2022   CALCIUM 9.4 10/10/2022   ANIONGAP 9 05/20/2020   EGFR 116 10/10/2022   Lab Results  Component Value Date   CHOL 201 (H) 10/10/2022   Lab Results  Component Value Date   HDL 50 10/10/2022   Lab Results  Component Value Date   LDLCALC 131 (H) 10/10/2022   Lab Results  Component Value Date   TRIG 111 10/10/2022   Lab Results  Component Value Date   CHOLHDL 4.0 10/10/2022   Lab Results  Component Value Date   HGBA1C 5.4 10/10/2022      Assessment & Plan:  Celiac disease Assessment & Plan: Will order autoantibodies to screen for celiac disease   Orders: -     Tissue transglutaminase, IgA -     Endomysial IgA Antibody    Follow-up: Return in about 3 months (around 02/13/2023).   Alvira Monday, FNP

## 2022-11-06 NOTE — Patient Instructions (Addendum)
I appreciate the opportunity to provide care to you today!    Follow up:  02/13/23  Labs:  Anti-endomysial antibody (EMA-IgA) and Anti-tissue transglutaminase antibodies (tTG) (tTG-IgA, tTG-IgG)      Please continue to a heart-healthy diet and increase your physical activities. Try to exercise for 71mns at least three times a week.      It was a pleasure to see you and I look forward to continuing to work together on your health and well-being. Please do not hesitate to call the office if you need care or have questions about your care.   Have a wonderful day and week. With Gratitude, GAlvira MondayMSN, FNP-BC

## 2022-11-07 DIAGNOSIS — Z139 Encounter for screening, unspecified: Secondary | ICD-10-CM | POA: Insufficient documentation

## 2022-11-07 DIAGNOSIS — K900 Celiac disease: Secondary | ICD-10-CM | POA: Insufficient documentation

## 2022-11-07 NOTE — Assessment & Plan Note (Signed)
Will order autoantibodies to screen for celiac disease

## 2022-11-08 LAB — ENDOMYSIAL IGA ANTIBODY: Endomysial IgA: NEGATIVE

## 2022-11-08 LAB — TISSUE TRANSGLUTAMINASE, IGA: Transglutaminase IgA: <2 U/mL (ref 0–3)

## 2022-11-21 DIAGNOSIS — Z1151 Encounter for screening for human papillomavirus (HPV): Secondary | ICD-10-CM | POA: Diagnosis not present

## 2022-11-21 DIAGNOSIS — E039 Hypothyroidism, unspecified: Secondary | ICD-10-CM | POA: Diagnosis not present

## 2022-11-21 DIAGNOSIS — Z7251 High risk heterosexual behavior: Secondary | ICD-10-CM | POA: Diagnosis not present

## 2022-11-21 DIAGNOSIS — Z124 Encounter for screening for malignant neoplasm of cervix: Secondary | ICD-10-CM | POA: Diagnosis not present

## 2022-12-17 ENCOUNTER — Ambulatory Visit: Payer: Medicaid Other | Admitting: Family Medicine

## 2023-01-04 DIAGNOSIS — E669 Obesity, unspecified: Secondary | ICD-10-CM | POA: Diagnosis not present

## 2023-01-04 DIAGNOSIS — R030 Elevated blood-pressure reading, without diagnosis of hypertension: Secondary | ICD-10-CM | POA: Diagnosis not present

## 2023-01-04 DIAGNOSIS — Z1881 Retained glass fragments: Secondary | ICD-10-CM | POA: Diagnosis not present

## 2023-01-04 DIAGNOSIS — Z6838 Body mass index (BMI) 38.0-38.9, adult: Secondary | ICD-10-CM | POA: Diagnosis not present

## 2023-01-30 ENCOUNTER — Encounter: Payer: Self-pay | Admitting: Family Medicine

## 2023-01-31 ENCOUNTER — Other Ambulatory Visit: Payer: Self-pay | Admitting: Family Medicine

## 2023-01-31 DIAGNOSIS — L03012 Cellulitis of left finger: Secondary | ICD-10-CM

## 2023-01-31 MED ORDER — SULFAMETHOXAZOLE-TRIMETHOPRIM 800-160 MG PO TABS: 1.0000 | ORAL_TABLET | Freq: Two times a day (BID) | ORAL | 0 refills | Status: AC

## 2023-02-13 ENCOUNTER — Ambulatory Visit: Payer: Medicaid Other | Admitting: Family Medicine

## 2023-04-09 ENCOUNTER — Ambulatory Visit: Payer: Medicaid Other | Admitting: Family Medicine

## 2023-11-21 ENCOUNTER — Ambulatory Visit (INDEPENDENT_AMBULATORY_CARE_PROVIDER_SITE_OTHER): Payer: Self-pay

## 2023-11-21 ENCOUNTER — Ambulatory Visit
Admission: RE | Admit: 2023-11-21 | Discharge: 2023-11-21 | Disposition: A | Discharge status: Home / Self Care | Payer: Self-pay | Source: Ambulatory Visit | Attending: Nurse Practitioner

## 2023-11-21 VITALS — BP 133/75 | HR 88 | Temp 98.3°F | Resp 18

## 2023-11-21 DIAGNOSIS — J209 Acute bronchitis, unspecified: Secondary | ICD-10-CM

## 2023-11-21 DIAGNOSIS — R059 Cough, unspecified: Secondary | ICD-10-CM

## 2023-11-21 MED ORDER — ALBUTEROL SULFATE HFA 108 (90 BASE) MCG/ACT IN AERS: 2.0000 | INHALATION_SPRAY | Freq: Four times a day (QID) | RESPIRATORY_TRACT | 0 refills | Status: AC | PRN

## 2023-11-21 MED ORDER — METHYLPREDNISOLONE SODIUM SUCC 125 MG IJ SOLR
80.0000 mg | Freq: Once | INTRAMUSCULAR | Status: AC
  Administered 2023-11-21: 80 mg via INTRAMUSCULAR

## 2023-11-21 MED ORDER — ALBUTEROL SULFATE (2.5 MG/3ML) 0.083% IN NEBU
2.5000 mg | INHALATION_SOLUTION | Freq: Once | RESPIRATORY_TRACT | Status: AC
  Administered 2023-11-21: 2.5 mg via RESPIRATORY_TRACT

## 2023-11-21 MED ORDER — PREDNISONE 20 MG PO TABS: 40.0000 mg | ORAL_TABLET | Freq: Every day | ORAL | 0 refills | Status: AC

## 2023-11-21 MED ORDER — PROMETHAZINE-DM 6.25-15 MG/5ML PO SYRP: 5.0000 mL | ORAL_SOLUTION | Freq: Four times a day (QID) | ORAL | 0 refills | Status: DC | PRN

## 2023-11-21 NOTE — Discharge Instructions (Signed)
Your chest x-ray is negative for pneumonia.  Symptoms do appear to be consistent with bronchitis. Take medication as prescribed. Increase fluids and allow for plenty of rest. May take over-the-counter Tylenol or ibuprofen as needed for pain, fever, or general discomfort. Recommend using a humidifier in your bedroom at nighttime during sleep and sleeping elevated on pillows while cough symptoms persist. Please be advised that your cough may last for several weeks.  If you are feeling well, make sure you are drinking plenty of fluids and using cough drops or over-the-counter cough and cold medications.  It is recommended that you follow-up if you develop new symptoms of fever, chills, or other concerns. Follow-up as needed.

## 2023-11-21 NOTE — ED Triage Notes (Signed)
Pt reports she has rattling in her chest, a bad cough, fatigue, headache, felt  "warm", and hurts to breathe x 1 week.

## 2023-11-21 NOTE — ED Provider Notes (Signed)
[facility]    CSN: [id number] Arrival date & time: 11/21/23  1648      History   Chief Complaint Chief Complaint  Patient presents with   Cough    Entered by patient    HPI Donna Kim is a 42 y.o. adult.   The history is provided by the patient.   Patient presents for complaints of cough that has been present for the past week.  She states over the past several days, cough is worsened.  She states that she feels like she is wheezing, and feels like there is burning and a "catch" similar to pulling in her chest.  Denies headache, sore throat, difficulty breathing, abdominal pain, nausea, vomiting, diarrhea, or rash.  Patient reports she does have a history of bronchitis, but symptoms feel different than what she normally experiences.  Reports she has been taking over-the-counter cough and cold medications for her symptoms. Past Medical History:  Diagnosis Date   ADHD    Back pain 11/12/2013   Cholestasis 08/2017   Contraceptive management 08/12/2014   Headache    Mental disorder    anxiety   No pertinent past medical history    Postpartum bleeding 07/15/2014   Rosacea    UTI (urinary tract infection)    Vaginal Pap smear, abnormal     Patient Active Problem List   Diagnosis Date Noted   Celiac disease 11/07/2022   Lipoma of buttock 05/07/2022   Hypothyroidism 05/07/2022   Rash 03/18/2022   Mood disorder (HCC) 04/04/2021   Gastroesophageal reflux disease without esophagitis 09/05/2020   Calculus of gallbladder without cholecystitis without obstruction 09/05/2020   Encounter for IUD insertion 10/26/2019   Migraine with aura 08/19/2019   Gestational thrombocytopenia (HCC) 06/22/2019   History of postpartum hypertension 10/30/2017   History of cholestasis during pregnancy 08/28/2017   Depression with anxiety 04/14/2017   ADHD (attention deficit hyperactivity disorder), inattentive type 10/02/2014   Rosacea, acne 03/18/2014   URI (upper respiratory  infection) 03/17/2013    Past Surgical History:  Procedure Laterality Date   WISDOM TOOTH EXTRACTION      OB History     Gravida  4   Para  4   Term  4   Preterm      AB      Living  4      SAB      IAB      Ectopic      Multiple  0   Live Births  4            Home Medications    Prior to Admission medications   Medication Sig Start Date End Date Taking? Authorizing Provider  albuterol (VENTOLIN HFA) 108 (90 Base) MCG/ACT inhaler Inhale 2 puffs into the lungs every 6 (six) hours as needed. 11/21/23  Yes Leath-Warren, Sadie Haber, NP  predniSONE (DELTASONE) 20 MG tablet Take 2 tablets (40 mg total) by mouth daily with breakfast for 5 days. 11/21/23 11/26/23 Yes Leath-Warren, Sadie Haber, NP  promethazine-dextromethorphan (PROMETHAZINE-DM) 6.25-15 MG/5ML syrup Take 5 mLs by mouth 4 (four) times daily as needed. 11/21/23  Yes Leath-Warren, Sadie Haber, NP  azelastine (ASTELIN) 0.1 % nasal spray Place 2 sprays into both nostrils daily. Use in each nostril as directed 10/10/22   Gilmore Laroche, FNP  ibuprofen (ADVIL) 800 MG tablet Take 1 tablet (800 mg total) by mouth daily as needed for mild pain or moderate pain. 04/04/21   Wilson Singer, MD  Multiple  Vitamin (MULTIVITAMIN) tablet Take 2 tablets by mouth in the morning and at bedtime.    [provider]  mupirocin ointment (BACTROBAN) 2 % Apply 1 Application topically 2 (two) times daily. 10/11/22   Gilmore Laroche, FNP  OVER THE COUNTER MEDICATION Vit d 2 Vit e  elderberry    [provider]    Family History Family History  Adopted: Yes  Problem Relation Age of Onset   ADD / ADHD Daughter     Social History Social History   Tobacco Use   Smoking status: Never   Smokeless tobacco: Never  Vaping Use   Vaping status: Never Used  Substance Use Topics   Alcohol use: No   Drug use: No     Allergies   Cefzil [cefprozil]   Review of Systems Review of Systems Per  HPI  Physical Exam Triage Vital Signs ED Triage Vitals  Encounter Vitals Group     BP 11/21/23 1722 133/75     Systolic BP Percentile --      Diastolic BP Percentile --      Pulse Rate 11/21/23 1722 88     Resp 11/21/23 1722 18     Temp 11/21/23 1722 98.3 F (36.8 C)     Temp Source 11/21/23 1722 Oral     SpO2 11/21/23 1722 98 %     Weight --      Height --      Head Circumference --      Peak Flow --      Pain Score 11/21/23 1724 6     Pain Loc --      Pain Education --      Exclude from Growth Chart --    No data found.  Updated Vital Signs BP 133/75 (BP Location: Right Arm)   Pulse 88   Temp 98.3 F (36.8 C) (Oral)   Resp 18   LMP 10/28/2023 (Approximate)   SpO2 98%   Visual Acuity Right Eye Distance:   Left Eye Distance:   Bilateral Distance:    Right Eye Near:   Left Eye Near:    Bilateral Near:     Physical Exam Vitals and nursing note reviewed.  Constitutional:      General: She is not in acute distress.    Appearance: Normal appearance.  HENT:     Head: Normocephalic.     Right Ear: Tympanic membrane, ear canal and external ear normal.     Left Ear: Tympanic membrane, ear canal and external ear normal.     Mouth/Throat:     Mouth: Mucous membranes are moist.     Pharynx: No posterior oropharyngeal erythema.  Eyes:     Extraocular Movements: Extraocular movements intact.     Conjunctiva/sclera: Conjunctivae normal.     Pupils: Pupils are equal, round, and reactive to light.  Cardiovascular:     Rate and Rhythm: Normal rate and regular rhythm.     Pulses: Normal pulses.     Heart sounds: Normal heart sounds.  Pulmonary:     Effort: Pulmonary effort is normal. No respiratory distress.     Breath sounds: Decreased air movement present. No stridor. No wheezing, rhonchi or rales.     Comments: Expiratory wheezing noted in the posterior bilateral lower lobes. Abdominal:     General: Bowel sounds are normal.     Palpations: Abdomen is soft.      Tenderness: There is no abdominal tenderness.  Musculoskeletal:     Cervical back:  Normal range of motion.  Lymphadenopathy:     Cervical: No cervical adenopathy.  Skin:    General: Skin is warm and dry.  Neurological:     General: No focal deficit present.     Mental Status: She is alert and oriented to person, place, and time.  Psychiatric:        Mood and Affect: Mood normal.        Behavior: Behavior normal.      UC Treatments / Results  Labs (all labs ordered are listed, but only abnormal results are displayed) Labs Reviewed - No data to display  EKG   Radiology DG Chest 2 View Result Date: 11/21/2023 CLINICAL DATA:  Cough for 1 week. EXAM: CHEST - 2 VIEW COMPARISON:  Chest radiograph 05/11/2017 FINDINGS: The heart size and mediastinal contours are within normal limits. Both lungs are clear. The visualized skeletal structures are unremarkable. IMPRESSION: No active cardiopulmonary disease. Electronically Signed   By: Annia Belt M.D.   On: 11/21/2023 17:56    Procedures Procedures (including critical care time)  Medications Ordered in UC Medications  albuterol (PROVENTIL) (2.5 MG/3ML) 0.083% nebulizer solution 2.5 mg (2.5 mg Nebulization Given 11/21/23 1755)  methylPREDNISolone sodium succinate (SOLU-MEDROL) 125 mg/2 mL injection 80 mg (80 mg Intramuscular Given 11/21/23 1756)    Initial Impression / Assessment and Plan / UC Course  I have reviewed the triage vital signs and the nursing notes.  Pertinent labs & imaging results that were available during my care of the patient were reviewed by me and considered in my medical decision making (see chart for details).  Chest x-ray is negative for pneumonia.  Suspect patient is experiencing acute bronchitis.  Will provide symptomatic treatment with prednisone 40 mg for the next 5 days for bronchial inflammation, albuterol inhaler as needed for shortness of breath, and Promethazine DM for the cough.  Supportive care  recommendations were provided and discussed with the patient to include fluids, rest, over-the-counter analgesics, and use of a humidifier at nighttime during sleep.  Discussed indications of when follow-up be necessary.  Patient was in agreement with this plan of care and verbalized understanding.  All questions were answered.  Patient stable for discharge.  Final Clinical Impressions(s) / UC Diagnoses   Final diagnoses:  Cough, unspecified type  Acute bronchitis, unspecified organism     Discharge Instructions      Your chest x-ray is negative for pneumonia.  Symptoms do appear to be consistent with bronchitis. Take medication as prescribed. Increase fluids and allow for plenty of rest. May take over-the-counter Tylenol or ibuprofen as needed for pain, fever, or general discomfort. Recommend using a humidifier in your bedroom at nighttime during sleep and sleeping elevated on pillows while cough symptoms persist. Please be advised that your cough may last for several weeks.  If you are feeling well, make sure you are drinking plenty of fluids and using cough drops or over-the-counter cough and cold medications.  It is recommended that you follow-up if you develop new symptoms of fever, chills, or other concerns. Follow-up as needed.     ED Prescriptions     Medication Sig Dispense Auth. Provider   predniSONE (DELTASONE) 20 MG tablet Take 2 tablets (40 mg total) by mouth daily with breakfast for 5 days. 10 tablet Leath-Warren, Sadie Haber, NP   albuterol (VENTOLIN HFA) 108 (90 Base) MCG/ACT inhaler Inhale 2 puffs into the lungs every 6 (six) hours as needed. 8 g Leath-Warren, Sadie Haber, NP  promethazine-dextromethorphan (PROMETHAZINE-DM) 6.25-15 MG/5ML syrup Take 5 mLs by mouth 4 (four) times daily as needed. 118 mL Leath-Warren, Sadie Haber, NP      PDMP not reviewed this encounter.   Abran Cantor, NP 11/21/23 [phone]

## 2023-11-30 ENCOUNTER — Encounter (HOSPITAL_COMMUNITY): Payer: Self-pay

## 2023-11-30 ENCOUNTER — Ambulatory Visit (HOSPITAL_COMMUNITY)
Admission: EM | Admit: 2023-11-30 | Discharge: 2023-11-30 | Disposition: A | Discharge status: Home / Self Care | Payer: Self-pay | Attending: Emergency Medicine | Admitting: Emergency Medicine

## 2023-11-30 ENCOUNTER — Ambulatory Visit (INDEPENDENT_AMBULATORY_CARE_PROVIDER_SITE_OTHER): Payer: Self-pay

## 2023-11-30 DIAGNOSIS — R053 Chronic cough: Secondary | ICD-10-CM

## 2023-11-30 DIAGNOSIS — R051 Acute cough: Secondary | ICD-10-CM

## 2023-11-30 DIAGNOSIS — M940 Chondrocostal junction syndrome [Tietze]: Secondary | ICD-10-CM

## 2023-11-30 MED ORDER — DOXYCYCLINE HYCLATE 100 MG PO CAPS: 100.0000 mg | ORAL_CAPSULE | Freq: Two times a day (BID) | ORAL | 0 refills | Status: AC

## 2023-11-30 MED ORDER — BENZONATATE 100 MG PO CAPS: 100.0000 mg | ORAL_CAPSULE | Freq: Three times a day (TID) | ORAL | 0 refills | Status: AC | PRN

## 2023-11-30 NOTE — Discharge Instructions (Addendum)
Please take Doxycycline as prescribed. Take with food to avoid upset stomach. The tessalon cough pills can be taken 3x daily. If this medication makes you drowsy, take only one pill before bed. Keep drinking lots of fluids! Inhaler 2-3 times daily Continue ibu/tylenol if needed for pain  Please go to the emergency department if symptoms worsen despite these therapies.

## 2023-11-30 NOTE — ED Provider Notes (Signed)
[facility]    CSN: [id number] Arrival date & time: 11/30/23  1154     History   Chief Complaint Chief Complaint  Patient presents with   Cough    HPI Donna Kim is a 42 y.o. female.  3 week history of productive cough Seen 9 days ago at urgent care, xray negative, was given prednisone, inhaler, and promethazine  Cough has worsened since then. She reports tactile fever 4 days ago with body aches and weakness.  Chest and back hurting with cough. Not short of breath or wheezing  Past Medical History:  Diagnosis Date   ADHD    Back pain 11/12/2013   Cholestasis 08/2017   Contraceptive management 08/12/2014   Headache    Mental disorder    anxiety   No pertinent past medical history    Postpartum bleeding 07/15/2014   Rosacea    UTI (urinary tract infection)    Vaginal Pap smear, abnormal     Patient Active Problem List   Diagnosis Date Noted   Celiac disease 11/07/2022   Lipoma of buttock 05/07/2022   Hypothyroidism 05/07/2022   Rash 03/18/2022   Mood disorder (HCC) 04/04/2021   Gastroesophageal reflux disease without esophagitis 09/05/2020   Calculus of gallbladder without cholecystitis without obstruction 09/05/2020   Encounter for IUD insertion 10/26/2019   Migraine with aura 08/19/2019   Gestational thrombocytopenia (HCC) 06/22/2019   History of postpartum hypertension 10/30/2017   History of cholestasis during pregnancy 08/28/2017   Depression with anxiety 04/14/2017   ADHD (attention deficit hyperactivity disorder), inattentive type 10/02/2014   Rosacea, acne 03/18/2014   URI (upper respiratory infection) 03/17/2013    Past Surgical History:  Procedure Laterality Date   WISDOM TOOTH EXTRACTION      OB History     Gravida  4   Para  4   Term  4   Preterm      AB      Living  4      SAB      IAB      Ectopic      Multiple  0   Live Births  4            Home Medications    Prior to Admission medications    Medication Sig Start Date End Date Taking? Authorizing Provider  benzonatate (TESSALON) 100 MG capsule Take 1 capsule (100 mg total) by mouth 3 (three) times daily as needed for cough. 11/30/23  Yes Sudie Bandel, Lurena Joiner, PA-C  doxycycline (VIBRAMYCIN) 100 MG capsule Take 1 capsule (100 mg total) by mouth 2 (two) times daily for 7 days. 11/30/23 12/07/23 Yes Brodee Mauritz, Lurena Joiner, PA-C  albuterol (VENTOLIN HFA) 108 (90 Base) MCG/ACT inhaler Inhale 2 puffs into the lungs every 6 (six) hours as needed. 11/21/23   Leath-Warren, Sadie Haber, NP  azelastine (ASTELIN) 0.1 % nasal spray Place 2 sprays into both nostrils daily. Use in each nostril as directed 10/10/22   Gilmore Laroche, FNP  ibuprofen (ADVIL) 800 MG tablet Take 1 tablet (800 mg total) by mouth daily as needed for mild pain or moderate pain. 04/04/21   Wilson Singer, MD  Multiple Vitamin (MULTIVITAMIN) tablet Take 2 tablets by mouth in the morning and at bedtime.    [provider]  mupirocin ointment (BACTROBAN) 2 % Apply 1 Application topically 2 (two) times daily. 10/11/22   Gilmore Laroche, FNP  OVER THE COUNTER MEDICATION Vit d 2 Vit e  elderberry    [provider]    Family History Family History  Adopted: Yes  Problem Relation Age of Onset   ADD / ADHD Daughter     Social History Social History   Tobacco Use   Smoking status: Never   Smokeless tobacco: Never  Vaping Use   Vaping status: Never Used  Substance Use Topics   Alcohol use: No   Drug use: No     Allergies   Cefzil [cefprozil]   Review of Systems Review of Systems  Respiratory:  Positive for cough.    Per HPI  Physical Exam Triage Vital Signs ED Triage Vitals  Encounter Vitals Group     BP 11/30/23 1242 139/88     Systolic BP Percentile --      Diastolic BP Percentile --      Pulse Rate 11/30/23 1242 87     Resp 11/30/23 1242 20     Temp 11/30/23 1242 98 F (36.7 C)     Temp Source 11/30/23 1242 Oral     SpO2 11/30/23 1242 97 %      Weight 11/30/23 1240 235 lb (106.6 kg)     Height 11/30/23 1240 5\' 5"  (1.651 m)     Head Circumference --      Peak Flow --      Pain Score 11/30/23 1240 6     Pain Loc --      Pain Education --      Exclude from Growth Chart --    No data found.  Updated Vital Signs BP 139/88 (BP Location: Right Arm)   Pulse 87   Temp 98 F (36.7 C) (Oral)   Resp 20   Ht 5\' 5"  (1.651 m)   Wt 235 lb (106.6 kg)   LMP 11/23/2023 (Approximate)   SpO2 97%   BMI 39.11 kg/m    Physical Exam Vitals and nursing note reviewed.  Constitutional:      General: She is not in acute distress.    Appearance: She is not toxic-appearing.  HENT:     Nose: No rhinorrhea.     Mouth/Throat:     Mouth: Mucous membranes are moist.     Pharynx: Oropharynx is clear. No posterior oropharyngeal erythema.  Eyes:     Conjunctiva/sclera: Conjunctivae normal.  Cardiovascular:     Rate and Rhythm: Normal rate and regular rhythm.     Pulses: Normal pulses.     Heart sounds: Normal heart sounds.  Pulmonary:     Effort: Pulmonary effort is normal.     Breath sounds: Wheezing and rales present.     Comments: Left upper lobe crackles. Expiratory wheezing throughout. Wet sounding cough frequently in clinic Musculoskeletal:     Cervical back: Normal range of motion.  Lymphadenopathy:     Cervical: No cervical adenopathy.  Skin:    General: Skin is warm and dry.  Neurological:     Mental Status: She is alert and oriented to person, place, and time.     UC Treatments / Results  Labs (all labs ordered are listed, but only abnormal results are displayed) Labs Reviewed - No data to display  EKG  Radiology DG Chest 2 View Result Date: 11/30/2023 CLINICAL DATA:  Cough for 3 weeks. EXAM: CHEST - 2 VIEW COMPARISON:  11/21/2023 FINDINGS: The heart size and mediastinal contours are within normal limits. Both lungs are clear. The visualized skeletal structures are unremarkable. IMPRESSION: No active cardiopulmonary  disease. Electronically Signed   By: Dietrich Pates.D.  On: 11/30/2023 13:08    Procedures Procedures  Medications Ordered in UC Medications - No data to display  Initial Impression / Assessment and Plan / UC Course  I have reviewed the triage vital signs and the nursing notes.  Pertinent labs & imaging results that were available during my care of the patient were reviewed by me and considered in my medical decision making (see chart for details).  Afebrile in clinic. Sating 97% on room air Repeat chest xray today negative. No change from prior 9 days ago. With 3+ week history of cough, recent worsening and new tactile fever, will cover for atypical infection with doxycycline BID x 7 days. Tessalon TID prn, continue inhaler. Also discussed costochondritis and symptomatic care. Return and ED precautions. Patient agreeable to plan, all questions answered   Final Clinical Impressions(s) / UC Diagnoses   Final diagnoses:  Acute cough  Persistent cough for 3 weeks or longer  Costochondritis     Discharge Instructions      Please take Doxycycline as prescribed. Take with food to avoid upset stomach. The tessalon cough pills can be taken 3x daily. If this medication makes you drowsy, take only one pill before bed. Keep drinking lots of fluids! Inhaler 2-3 times daily Continue ibu/tylenol if needed for pain  Please go to the emergency department if symptoms worsen despite these therapies.     ED Prescriptions     Medication Sig Dispense Auth. Provider   benzonatate (TESSALON) 100 MG capsule Take 1 capsule (100 mg total) by mouth 3 (three) times daily as needed for cough. 30 capsule Janeen Watson, PA-C   doxycycline (VIBRAMYCIN) 100 MG capsule Take 1 capsule (100 mg total) by mouth 2 (two) times daily for 7 days. 14 capsule [place] Ravenscroft, Lurena Joiner, PA-C      PDMP not reviewed this encounter.   Ashyia Schraeder, Lurena Joiner, [place] 11/30/23 1406

## 2023-11-30 NOTE — ED Triage Notes (Signed)
Pt presents with complaints of cough x 3 weeks. Pt reports she was seen at urgent care in [place], prescribed inhaler, prednisone, and cough syrup. All interventions completed however cough is still ongoing. Pt also reports a fever on 12/25. Pt states "I am coughing so much more. My chest is beginning to hurt, in addition to my left shoulder blade." Pt currently rates pain a 6/10.
